# Patient Record
Sex: Female | Born: 1960 | Race: Black or African American | Hispanic: No | Marital: Single | Smoking: Never smoker
Health system: Southern US, Community
[De-identification: ages and names within clinical notes are randomized; demographics above are authoritative.]

## PROBLEM LIST (undated history)

## (undated) DIAGNOSIS — Z87442 Personal history of urinary calculi: Secondary | ICD-10-CM

## (undated) DIAGNOSIS — I10 Essential (primary) hypertension: Secondary | ICD-10-CM

## (undated) DIAGNOSIS — M1990 Unspecified osteoarthritis, unspecified site: Secondary | ICD-10-CM

## (undated) DIAGNOSIS — G4730 Sleep apnea, unspecified: Secondary | ICD-10-CM

## (undated) DIAGNOSIS — N200 Calculus of kidney: Secondary | ICD-10-CM

## (undated) HISTORY — PX: GASTRIC BYPASS: SHX52

---

## 2005-01-20 ENCOUNTER — Emergency Department: Payer: Self-pay | Admitting: Emergency Medicine

## 2005-01-23 ENCOUNTER — Ambulatory Visit: Payer: Self-pay | Admitting: Family Medicine

## 2005-01-23 IMAGING — US US EXTREM LOW VENOUS*R*
1 series · 18 of 24 positions shown · non-contrast
Comparison: none

REASON FOR EXAM: pain swelling
COMMENTS:

PROCEDURE:     US  - US DOPPLER LOW EXTR RIGHT  - [DATE]  [DATE]
RESULT:        The phasic, augmentation and  Valsalva flow waveforms are
normal.  The femoral and popliteal vein shows compressibility throughout its
course.  Doppler examination shows no deep venous occlusion.

[Series 1: us extrem low venous*right* · 18 of 32 slices shown]
[im 1/32]
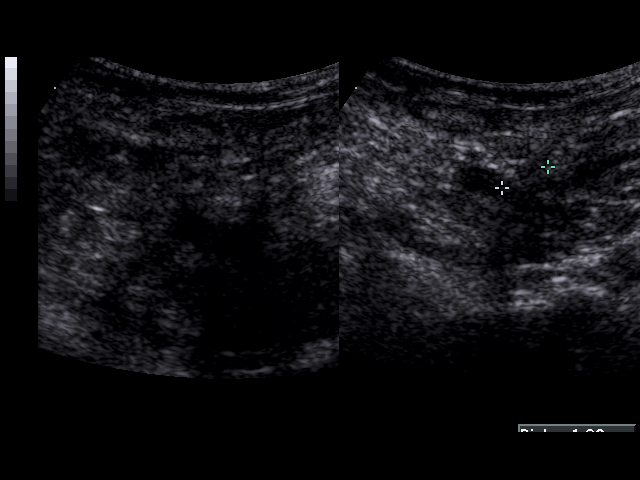
[im 3/32]
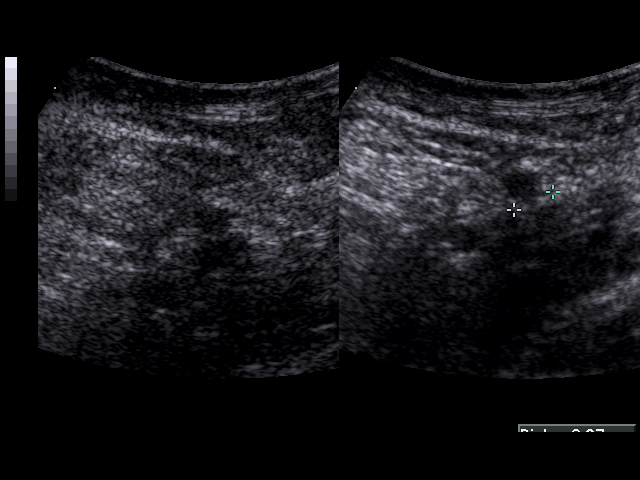
[im 5/32]
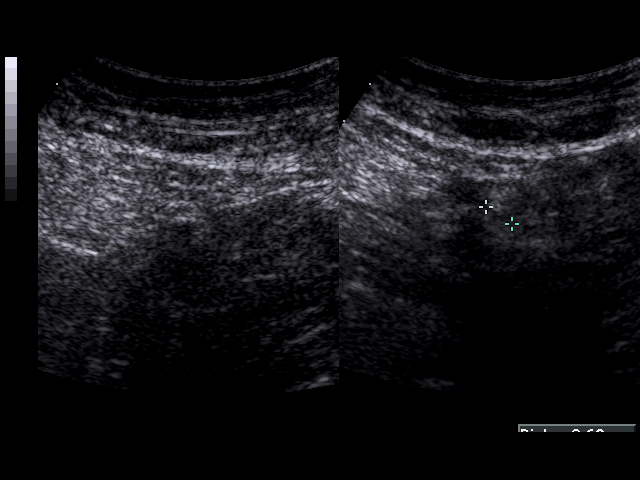
[im 6/32]
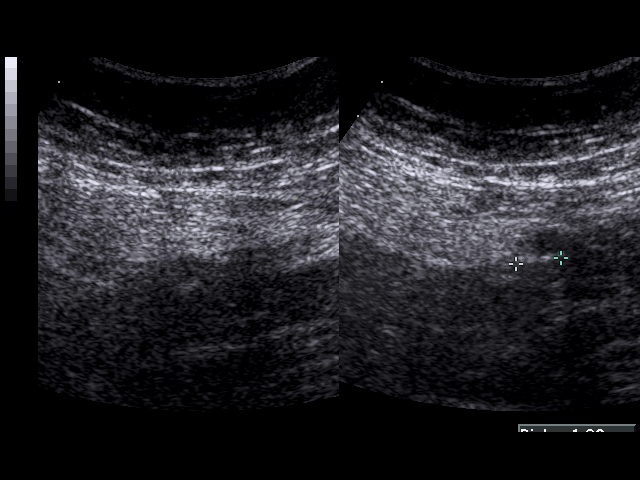
[im 9/32]
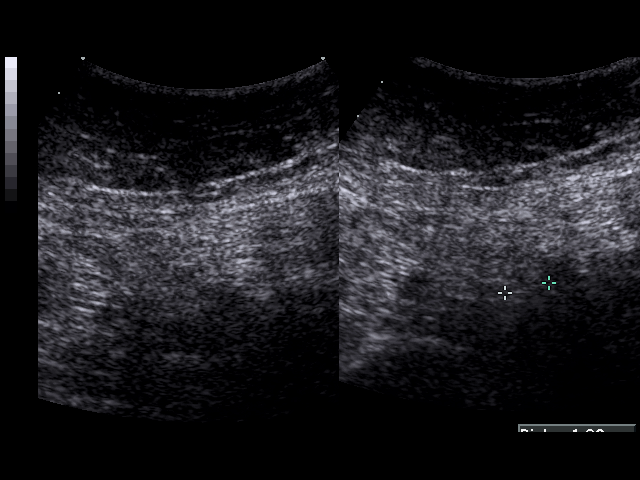
[im 10/32]
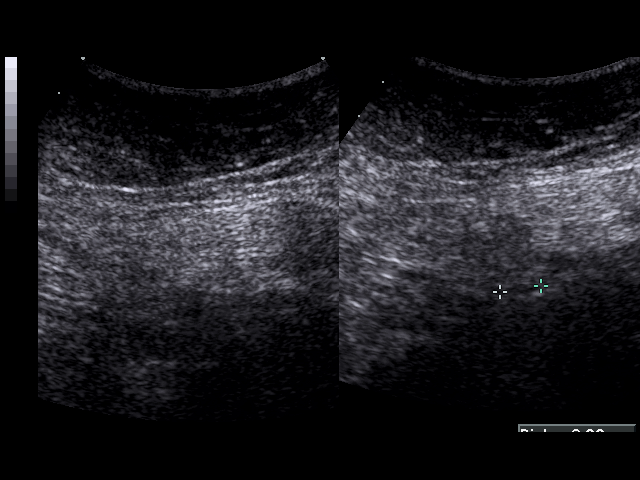
[im 11/32]
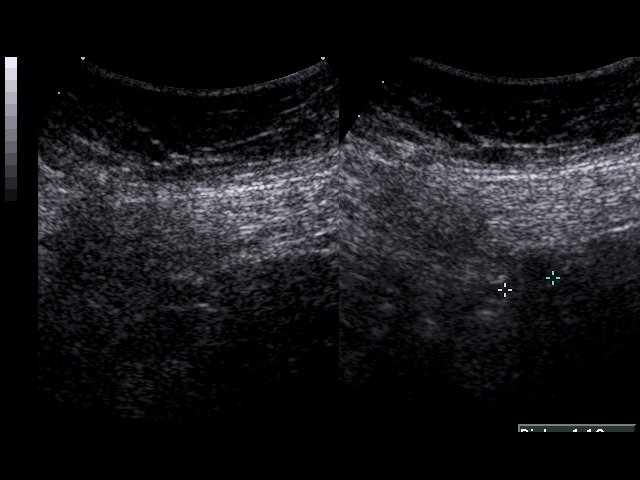
[im 14/32]
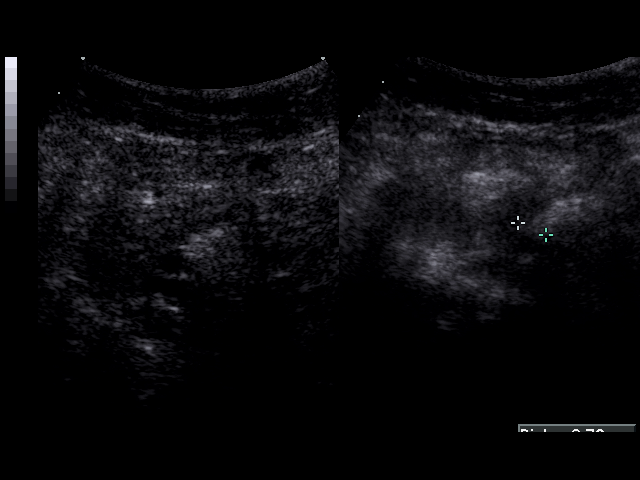
[im 15/32]
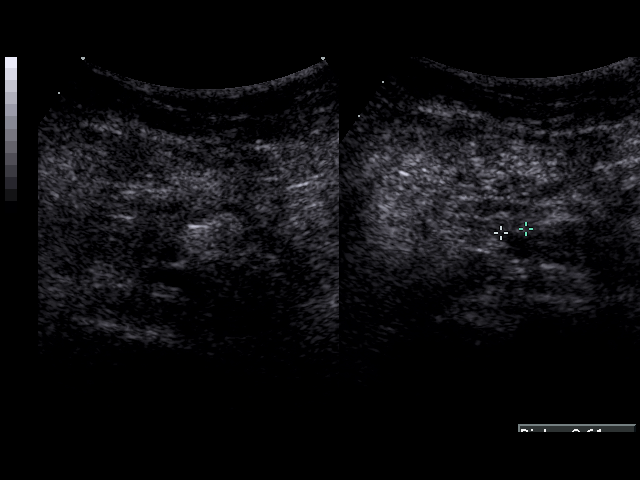
[im 17/32]
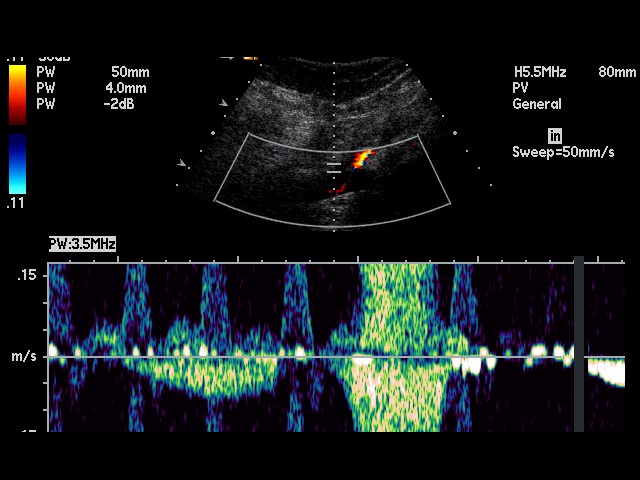
[im 19/32]
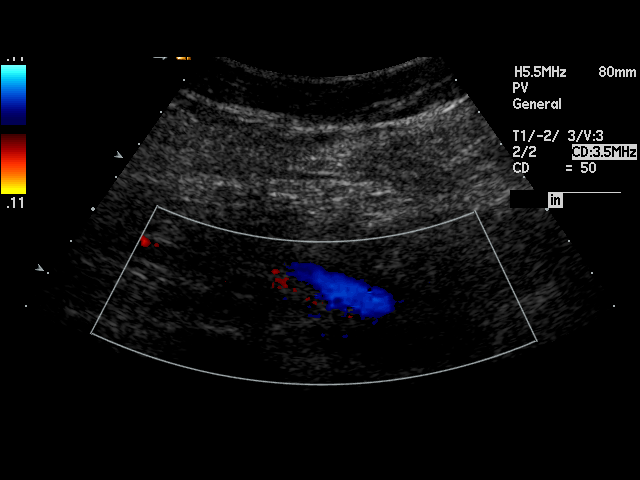
[im 21/32]
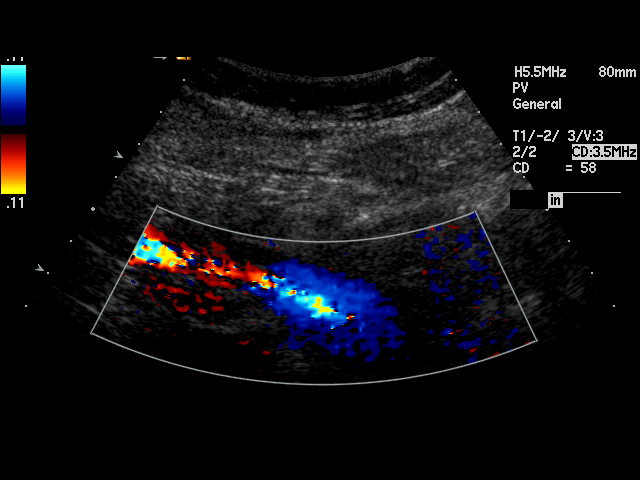
[im 22/32]
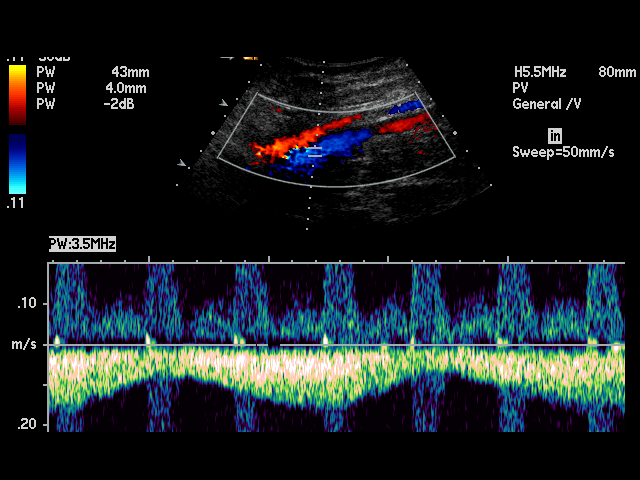
[im 25/32]
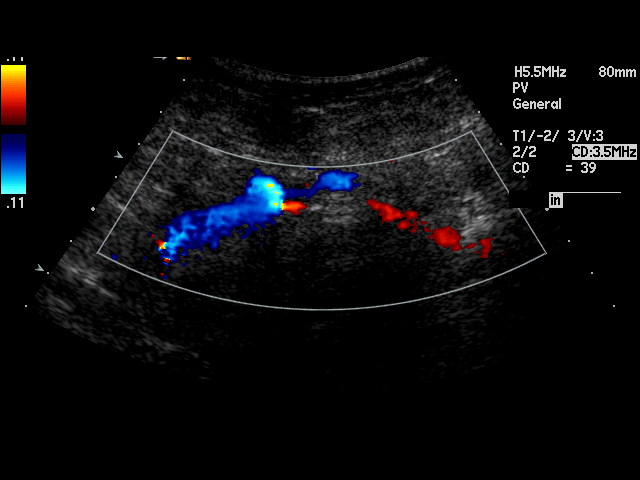
[im 26/32]
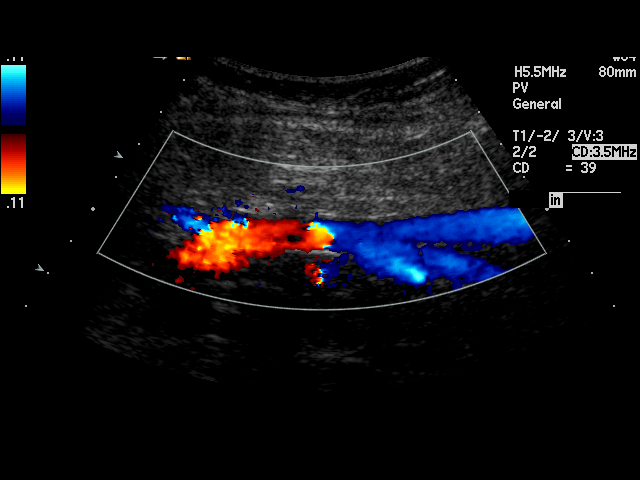
[im 27/32]
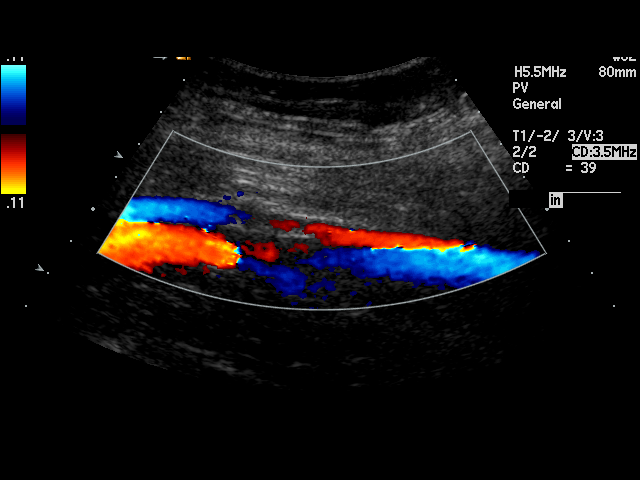
[im 30/32]
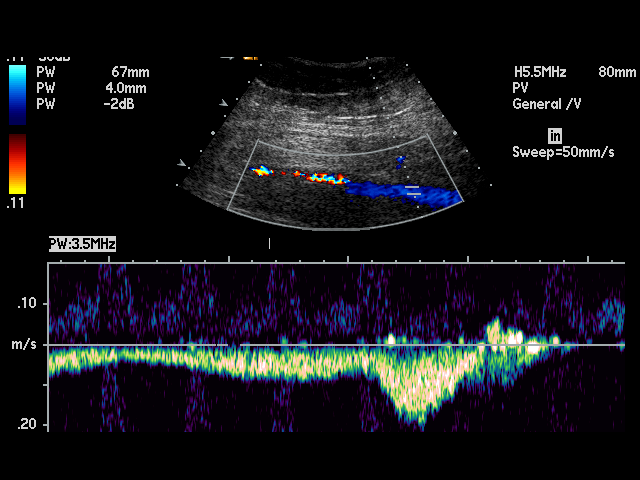
[im 32/32]
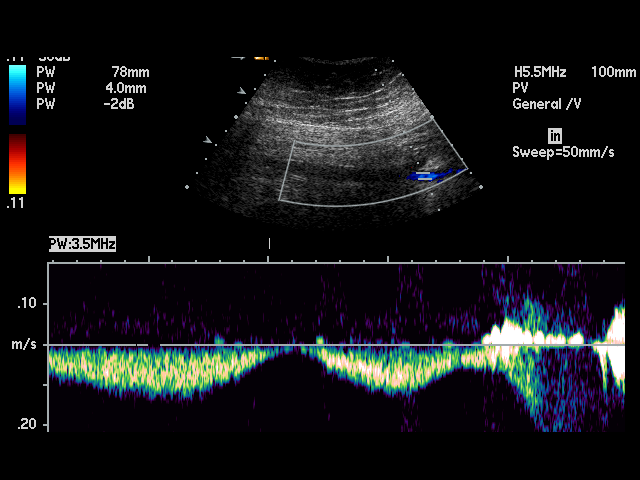

[18 of 24 positions shown; findings below may reference images not displayed]

IMPRESSION: No deep venous occlusion or deep venous thrombus is identified.

## 2007-01-20 IMAGING — CR DG CHEST 2V
2 series · 2 of 2 positions shown · non-contrast
Comparison: None.

CLINICAL DATA: Hemoptysis. Chills and fever.  Cough.  Bilateral leg swelling. 
 CHEST - 2 VIEW ? [DATE]:

[view not recorded (1 of 2)]
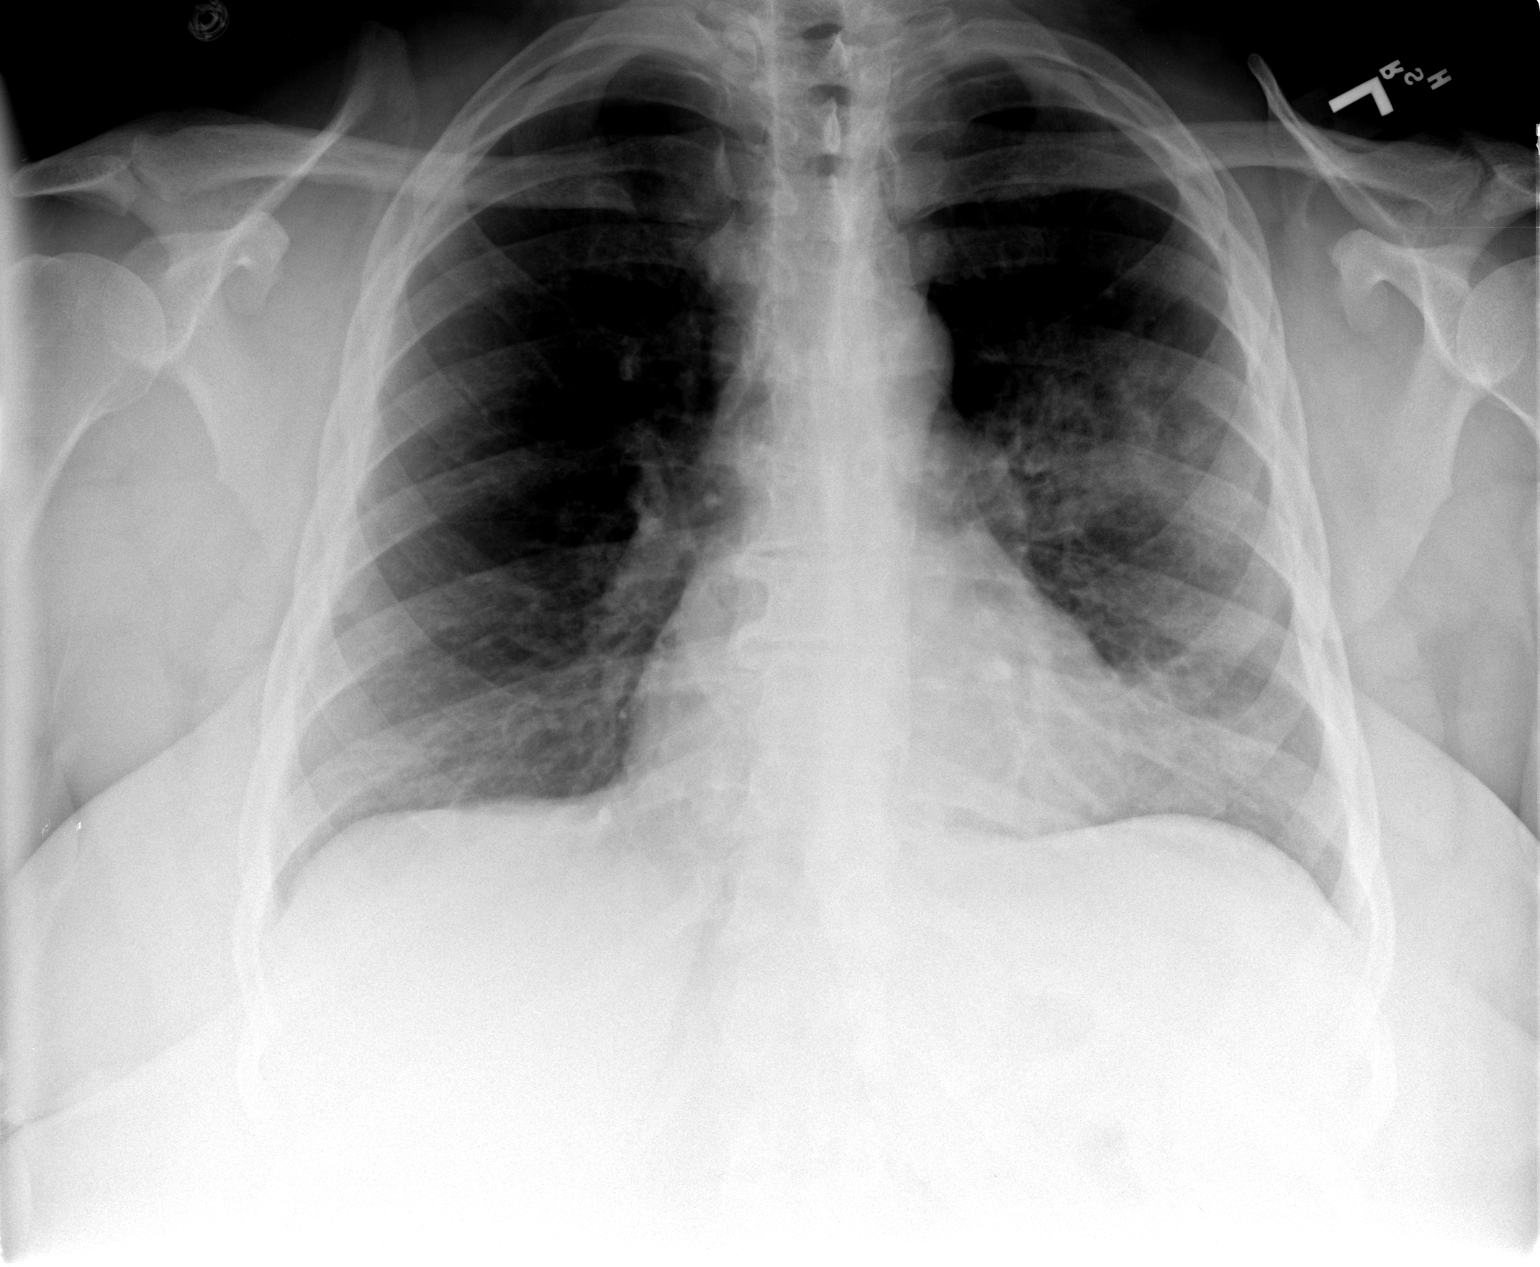

[view not recorded (2 of 2)]
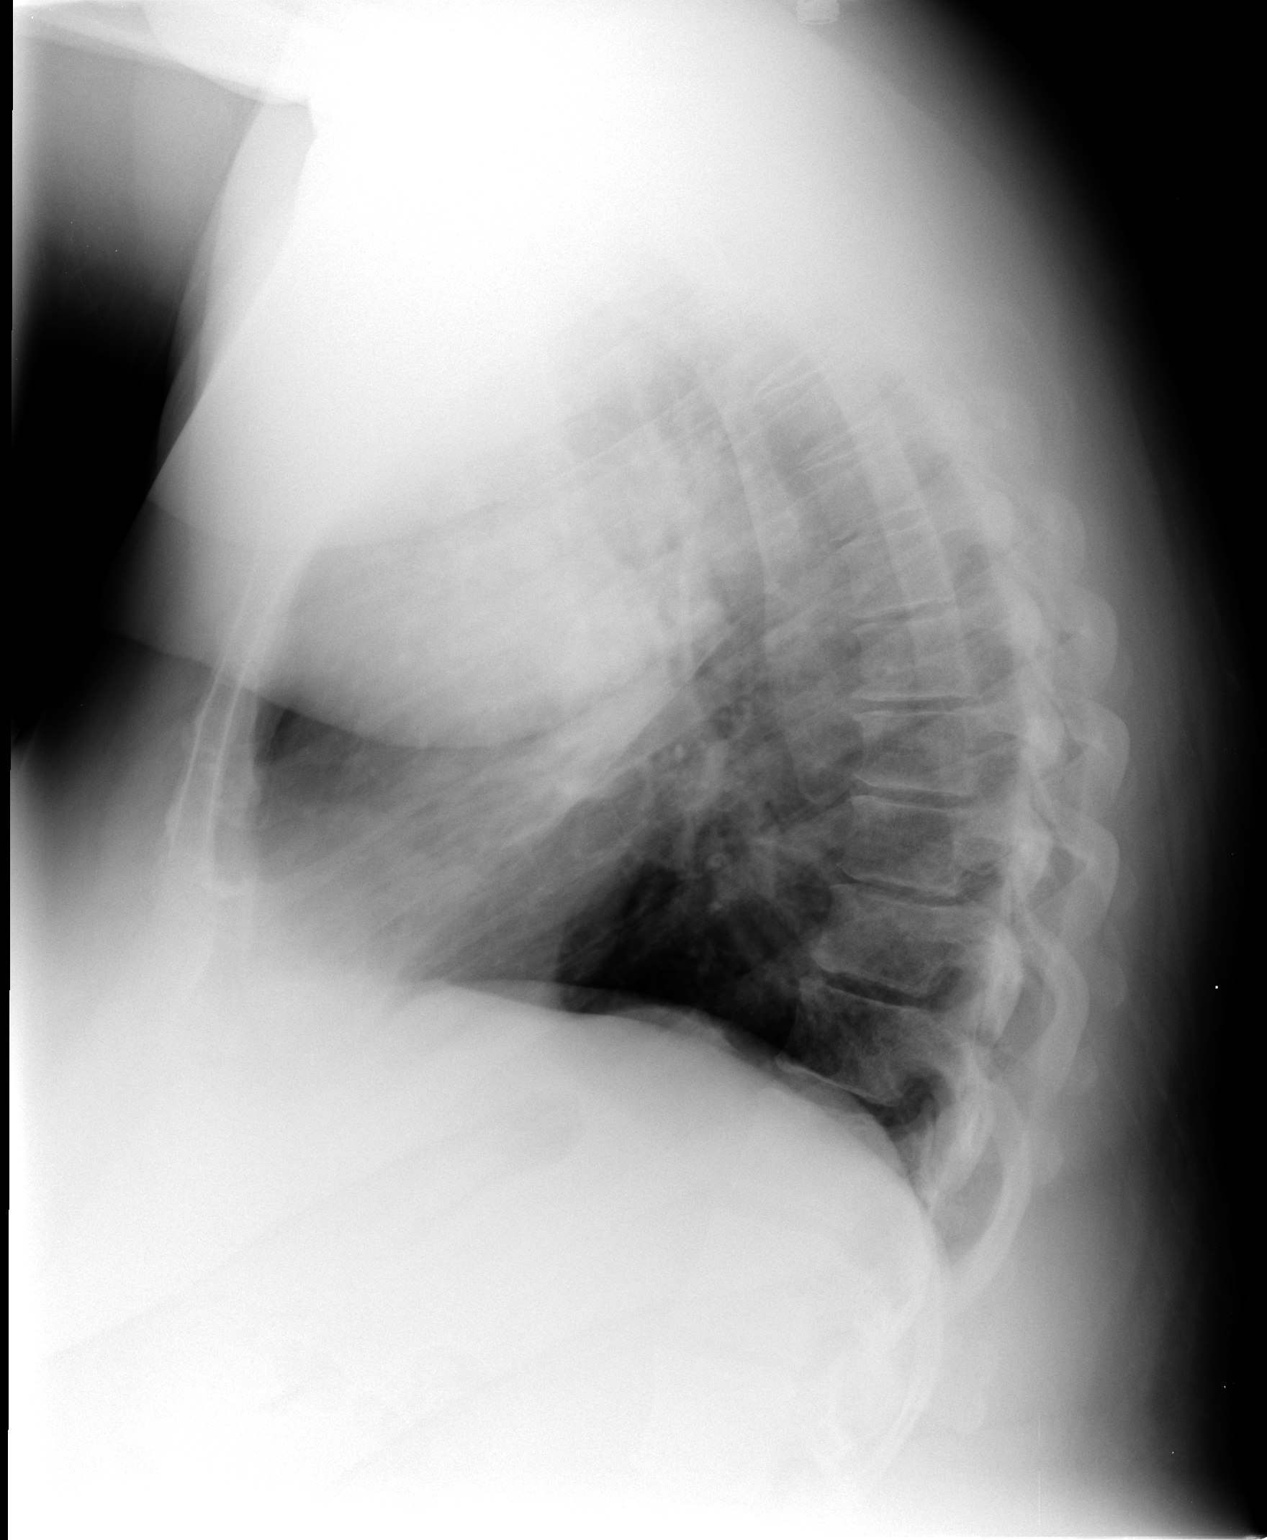

[2 of 2 positions shown; findings below may reference images not displayed]

FINDINGS: There is pneumonia in the left upper lobe, including the lingula.  The left lower lobe is clear.   The right lung is clear. The right lung is clear.  Heart size and vascularity are normal.  No acute bony abnormality.
IMPRESSION: Left upper lobe pneumonia.

## 2007-02-10 ENCOUNTER — Emergency Department (HOSPITAL_COMMUNITY): Admission: EM | Admit: 2007-02-10 | Discharge: 2007-02-10 | Payer: Self-pay | Admitting: *Deleted

## 2007-05-11 ENCOUNTER — Emergency Department: Payer: Self-pay | Admitting: Emergency Medicine

## 2007-05-11 IMAGING — CR DG ANKLE 2V *L*
1 series · 2 of 2 positions shown · non-contrast
Comparison: none

REASON FOR EXAM: pain and swelling
COMMENTS:

[Series 1: view not recorded · 0.17mm/px · 2 of 2 slices shown]
[im 1/2]
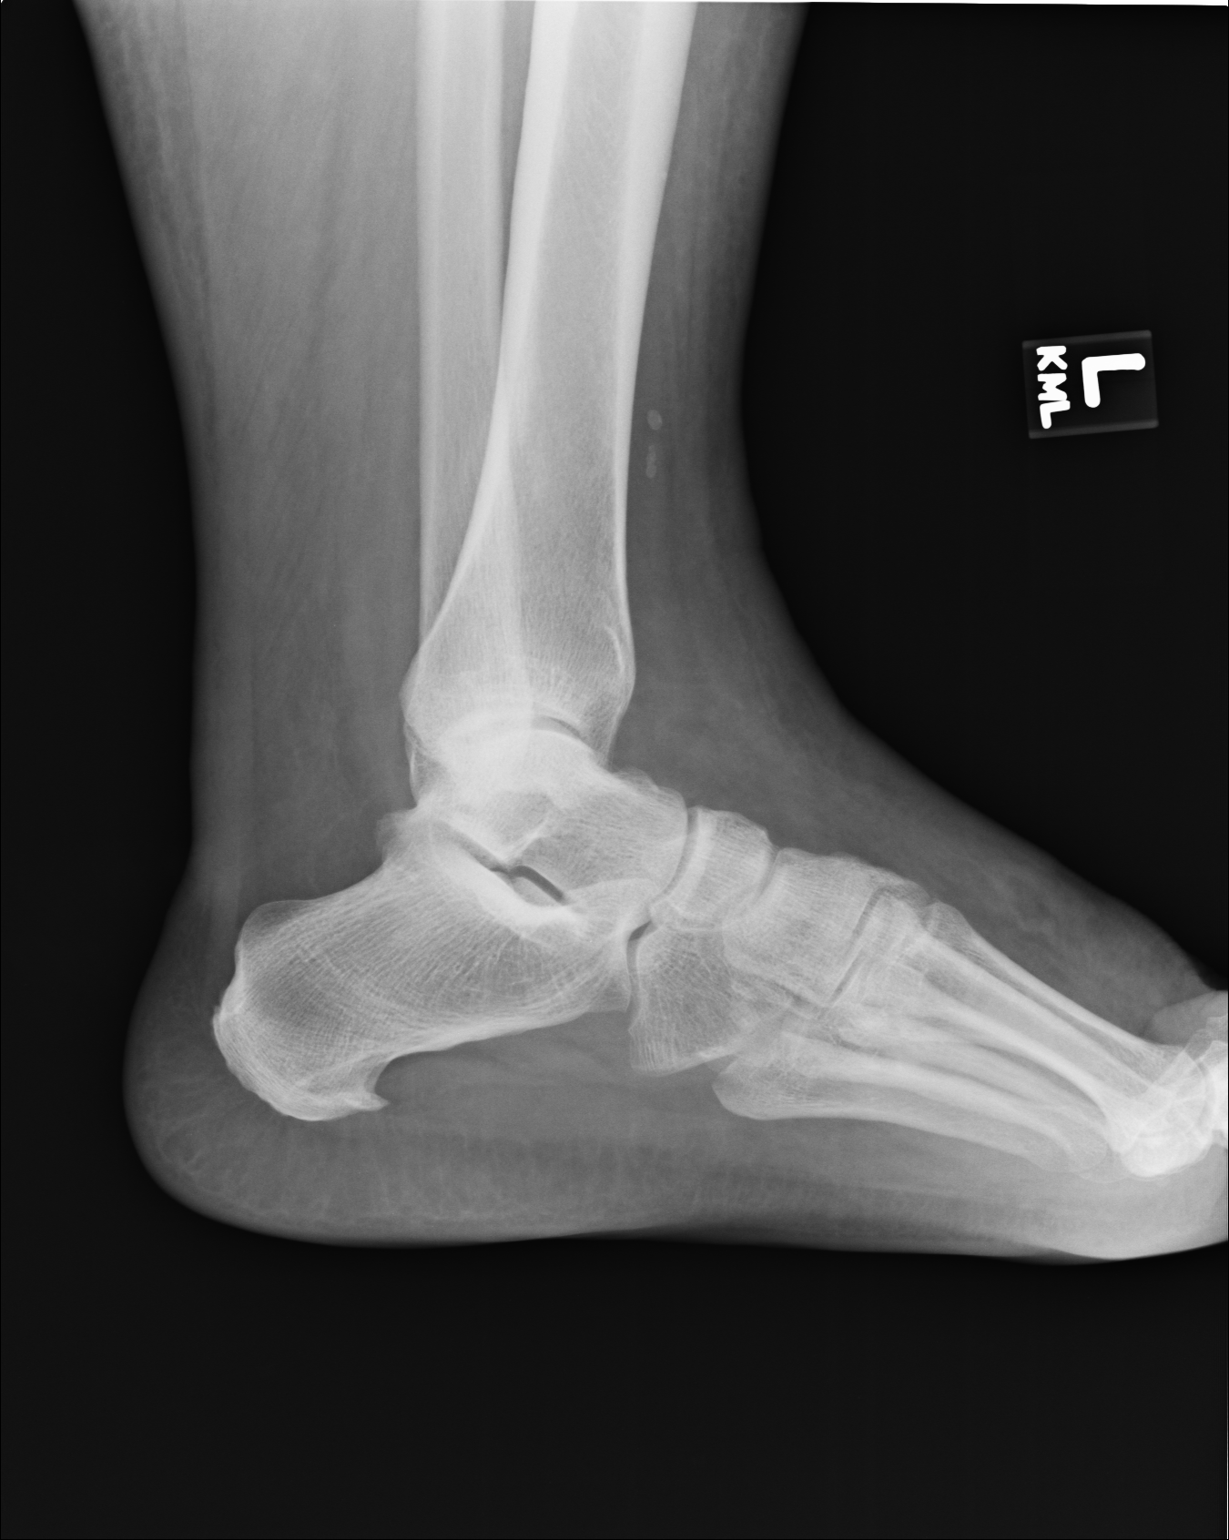
[im 2/2]
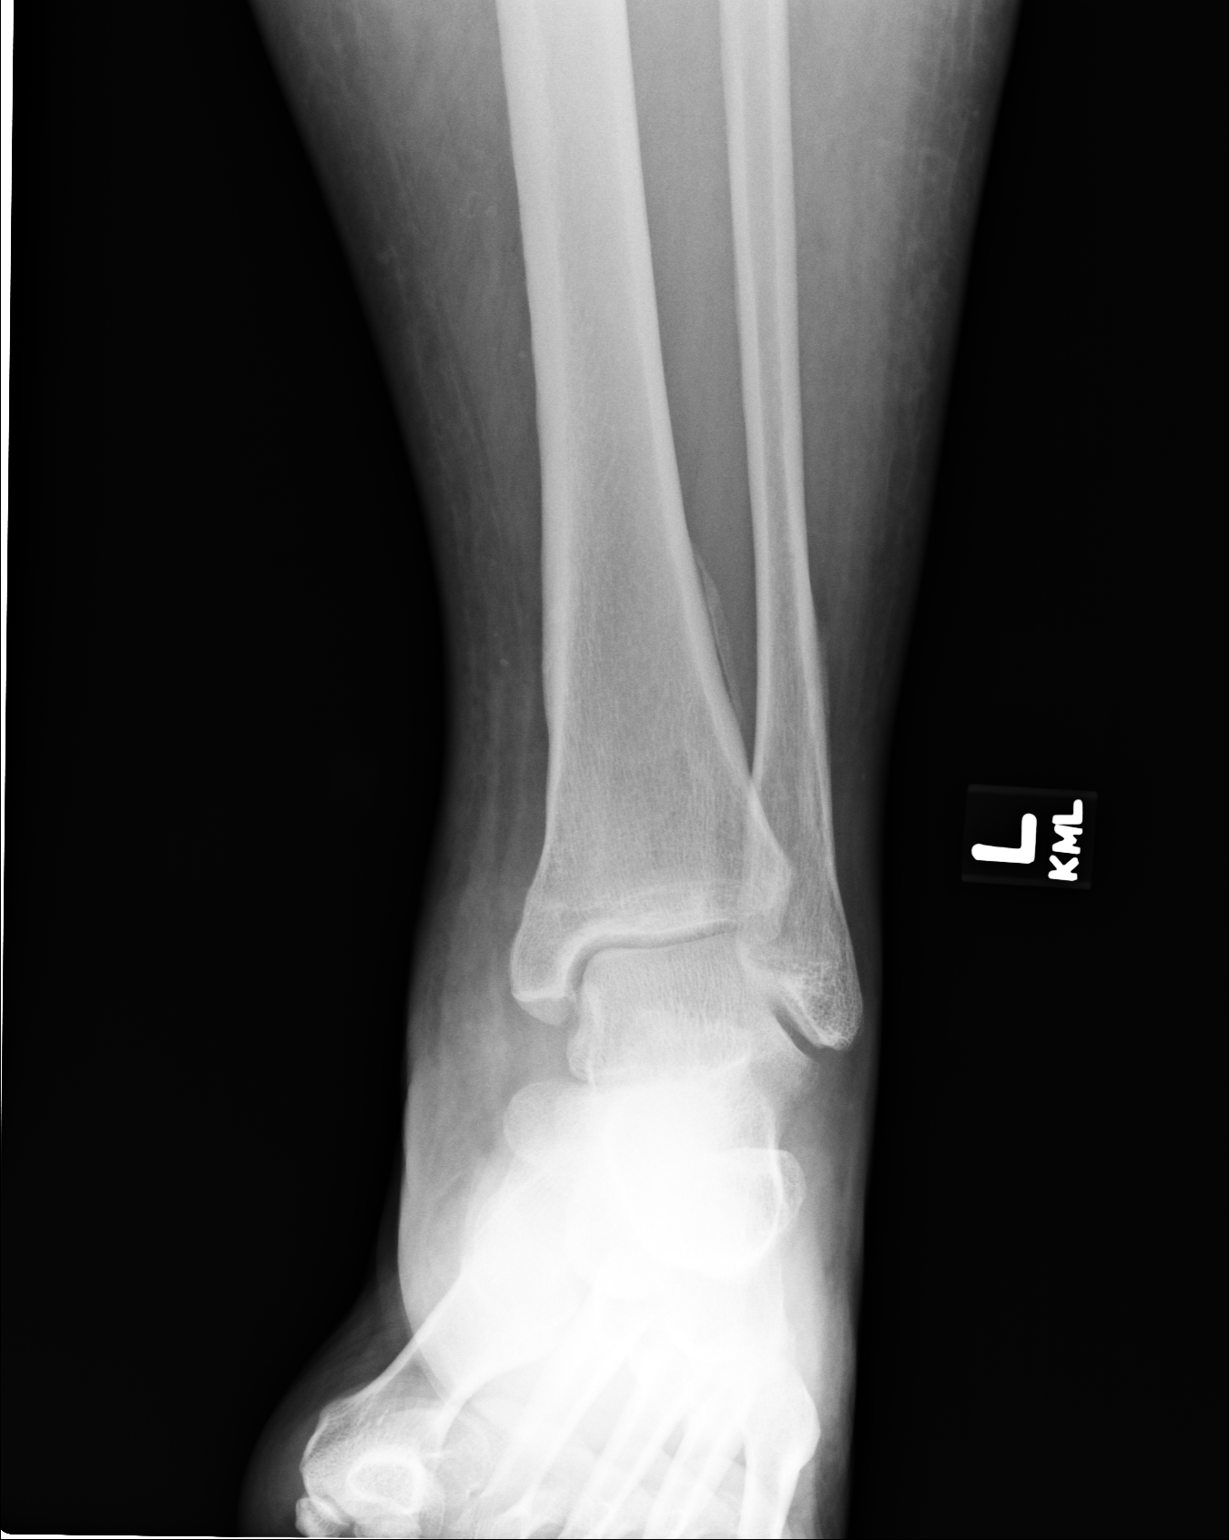

[2 of 2 positions shown; findings below may reference images not displayed]

PROCEDURE:     DXR - DXR ANKLE LEFT AP AND LATERAL  - [DATE]  [DATE]

RESULT:     AP and lateral views of the LEFT ankle reveals the joint mortise
to be preserved. No malleolar fracture is seen. There is a plantar calcaneal
spur. There is mild swelling over the medial malleolar region as well as
elsewhere in the ankle. There is subtle periosteal reaction along the medial
tibial metadiaphysis distally which likely is related to the interosseous
membrane.
IMPRESSION: 1. I do not see acute bony abnormality of the LEFT ankle. If there is a
history of trauma, followup CT or MRI may be useful.
2. There is a plantar calcaneal spur.

## 2007-05-11 IMAGING — US US EXTREM LOW VENOUS*L*
1 series · 17 of 24 positions shown · non-contrast
Comparison: none

REASON FOR EXAM: pain and swelling in leg   [HOSPITAL]
COMMENTS:

[Series 1: us extrem low venous*left* · 17 of 30 slices shown]
[im 1/30]
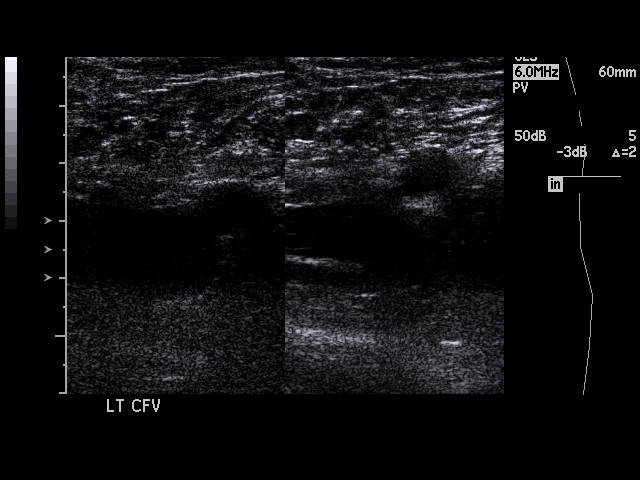
[im 3/30]
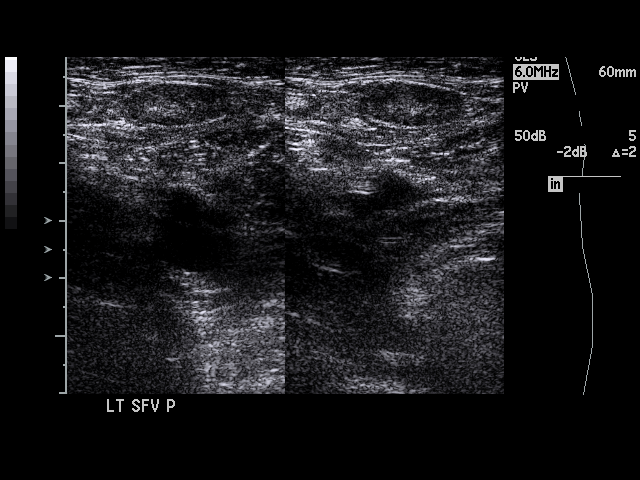
[im 4/30]
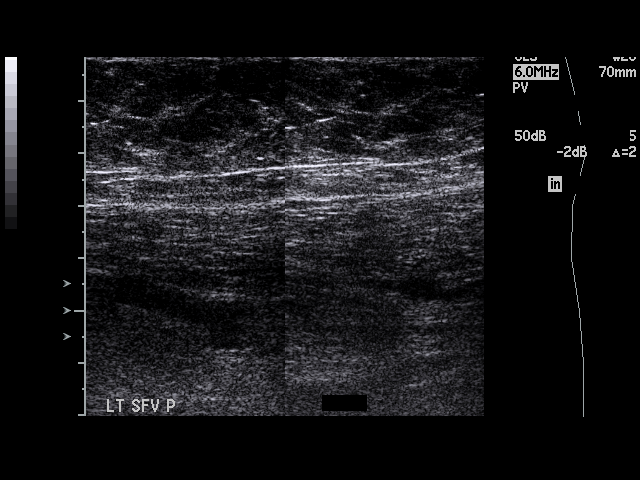
[im 6/30]
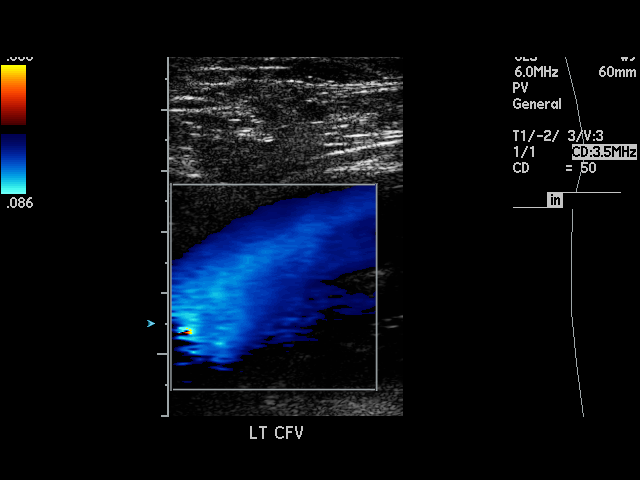
[im 8/30]
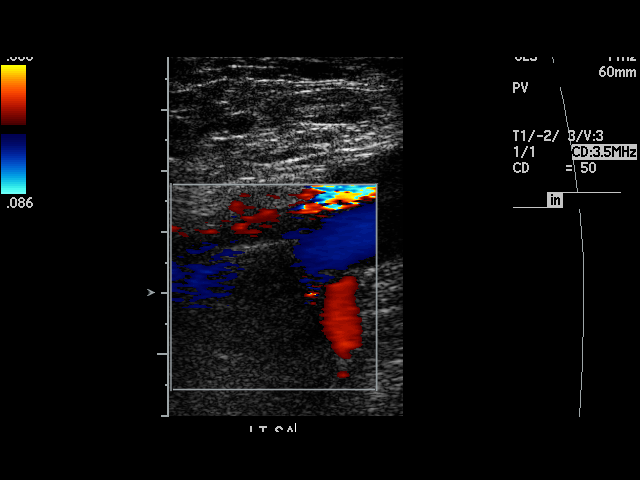
[im 9/30]
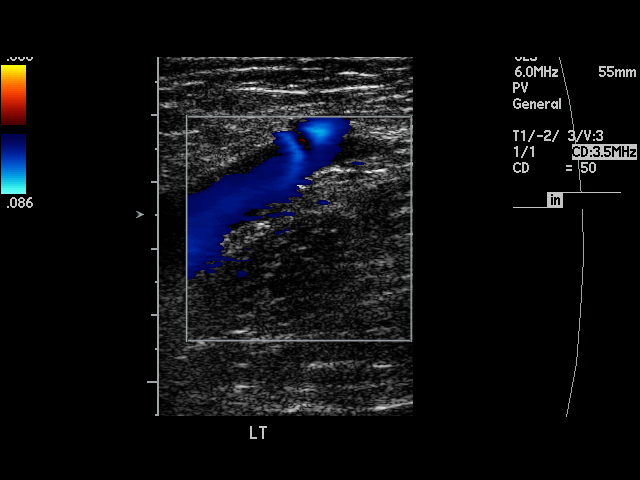
[im 12/30]
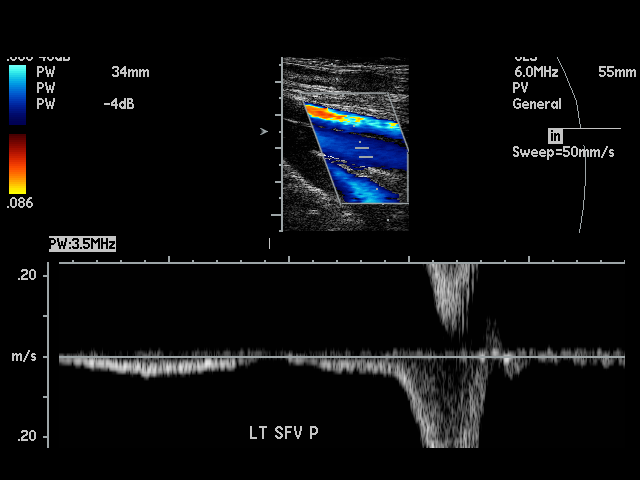
[im 13/30]
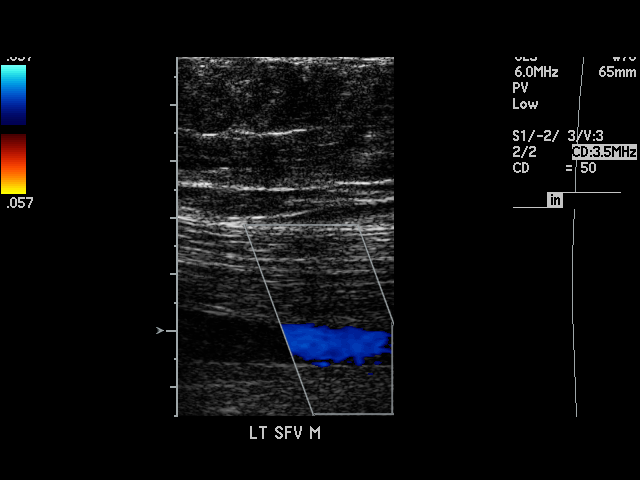
[im 16/30]
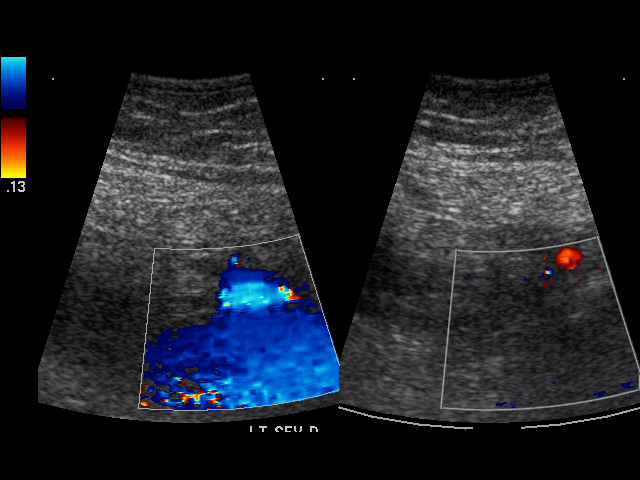
[im 17/30]
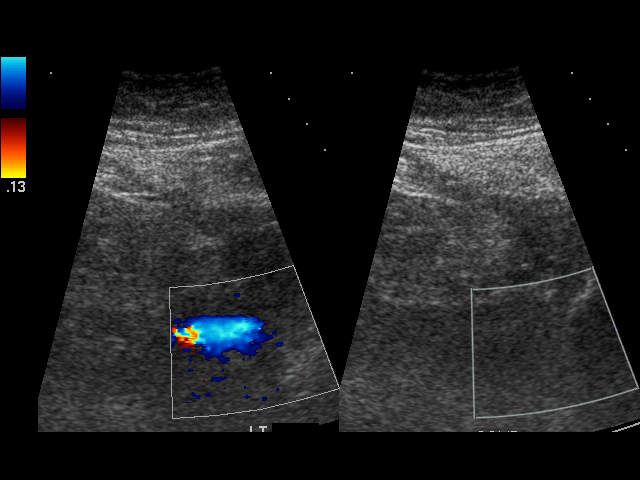
[im 18/30]
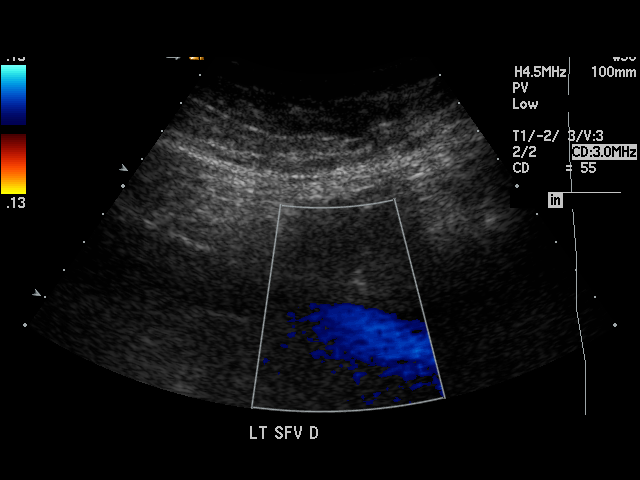
[im 21/30]
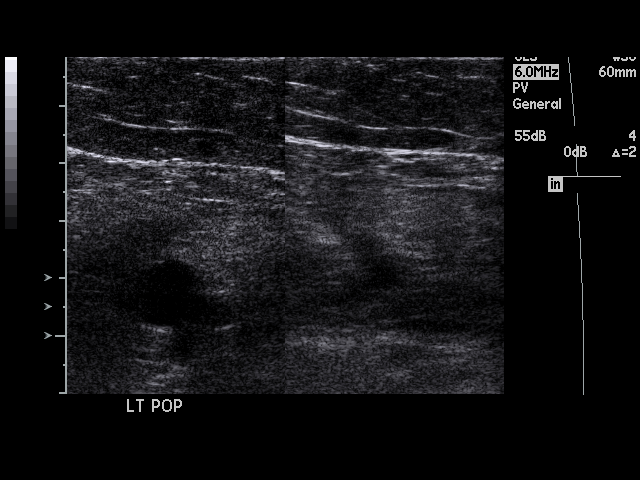
[im 22/30]
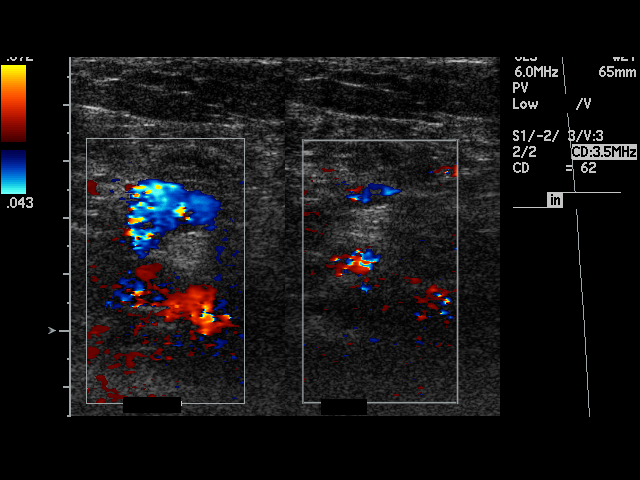
[im 24/30]
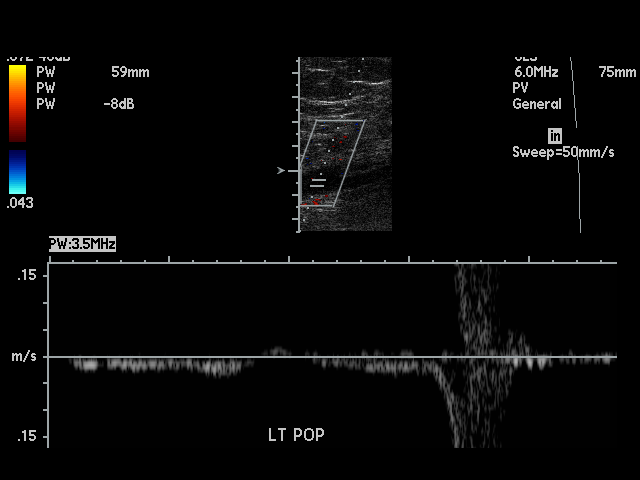
[im 26/30]
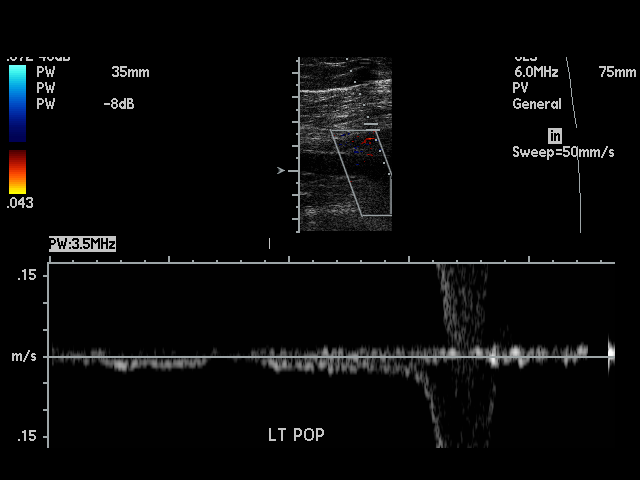
[im 27/30]
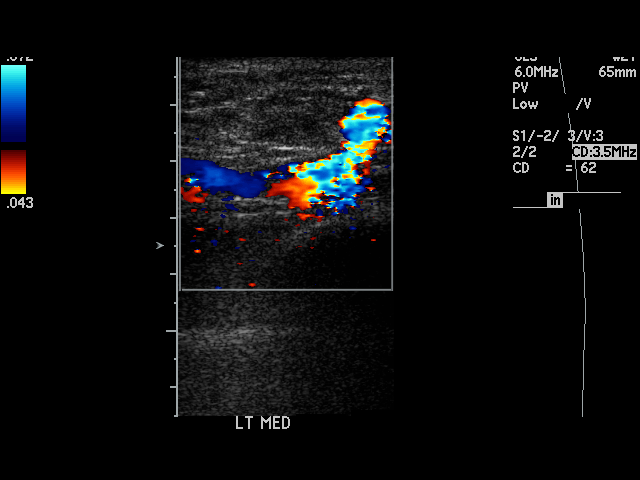
[im 30/30]
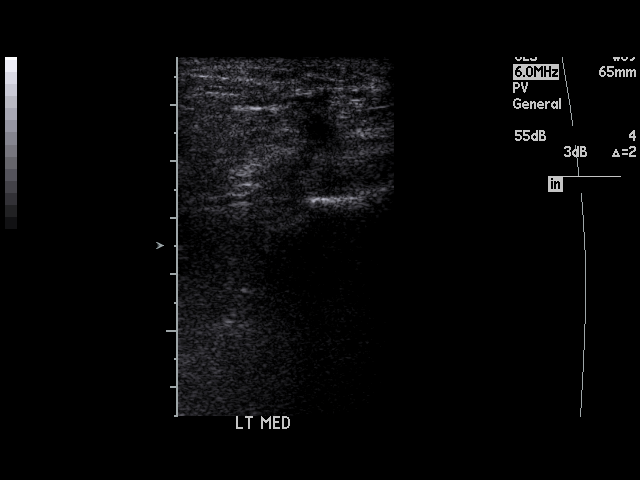

[17 of 24 positions shown; findings below may reference images not displayed]

PROCEDURE:     US  - US DOPPLER LOW EXTR LEFT  - [DATE]  [DATE]

RESULT:     Duplex Doppler interrogation of the deep venous system of the
left leg is performed with compression, color Doppler and spectral Doppler
technique. Limit investigation of the ankle medially is also performed.

The study demonstrates the deep venous system is fully compressible. The
color Doppler and spectral Doppler appearance is normal. There is increased
flow during distal augmentation. In the area of the patient's symptoms the
sonographic appearance is unremarkable.
IMPRESSION: No evidence of left lower extremity DVT.

## 2007-05-24 ENCOUNTER — Emergency Department: Payer: Self-pay

## 2008-07-21 ENCOUNTER — Emergency Department: Payer: Self-pay | Admitting: Emergency Medicine

## 2008-11-05 ENCOUNTER — Emergency Department: Payer: Self-pay | Admitting: Emergency Medicine
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
## 2008-12-18 ENCOUNTER — Emergency Department: Payer: Self-pay | Admitting: Emergency Medicine
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
## 2009-05-05 ENCOUNTER — Emergency Department: Payer: Self-pay | Admitting: Emergency Medicine
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
## 2010-05-22 ENCOUNTER — Emergency Department: Payer: Self-pay | Admitting: Emergency Medicine

## 2010-05-22 IMAGING — US US PELV - US TRANSVAGINAL
1 series · 17 of 25 positions shown · non-contrast
Comparison: none

REASON FOR EXAM: PELVIC PAIN, PLEASE EVAL FOR FLOW TO OVARIES
COMMENTS:

[Series 1: us pelv - us transvaginal · 17 of 46 slices shown]
[im 1/46]
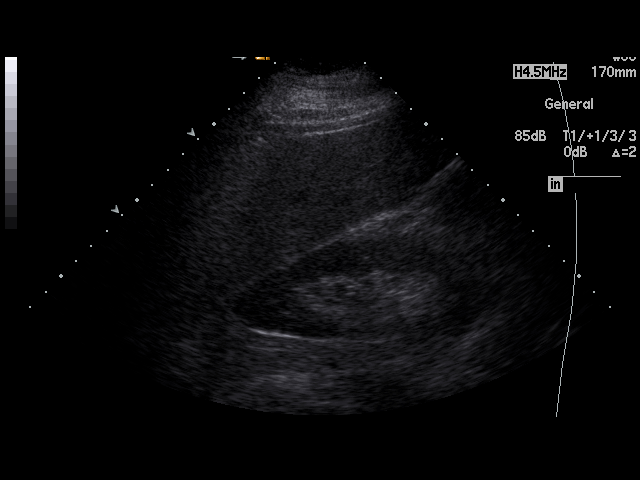
[im 4/46]
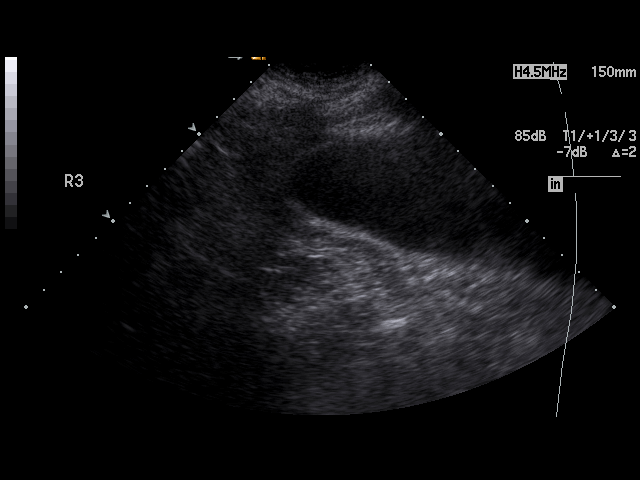
[im 6/46]
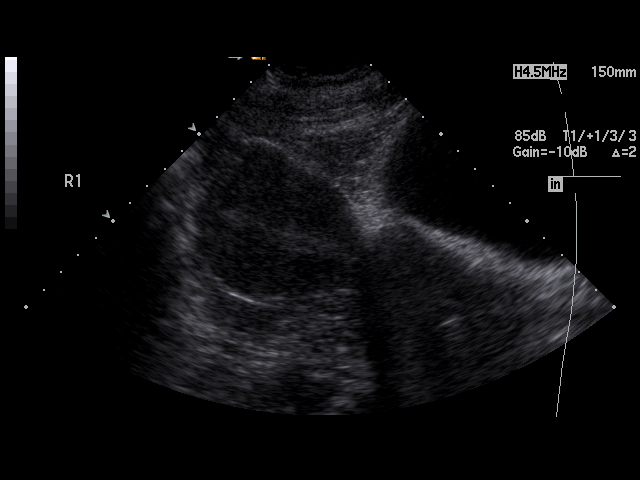
[im 10/46]
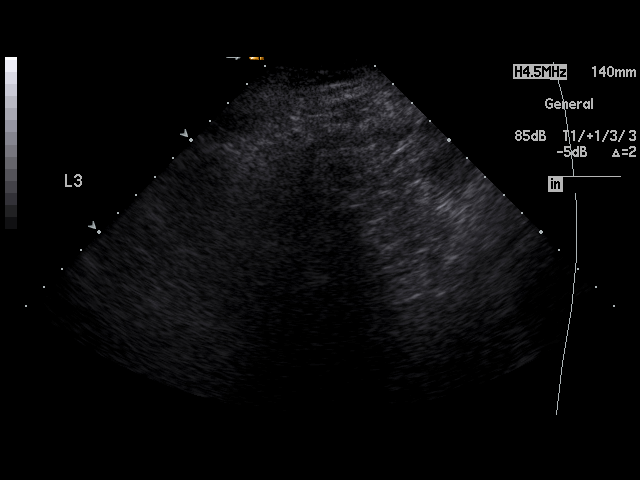
[im 12/46]
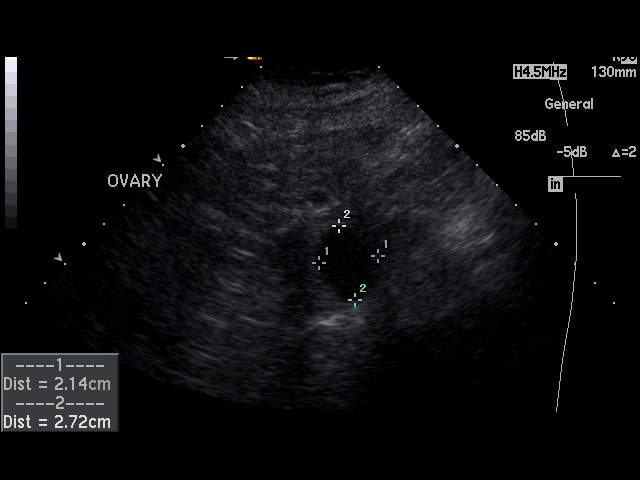
[im 16/46]
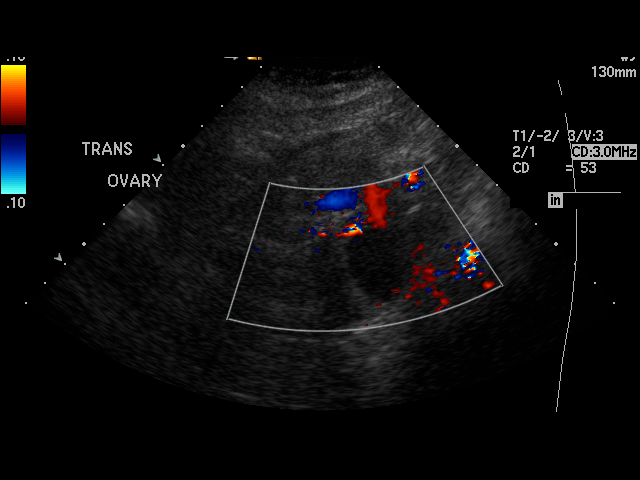
[im 17/46]
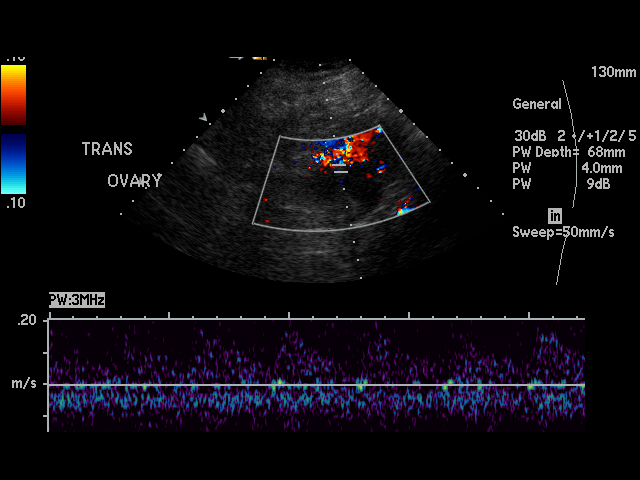
[im 21/46]
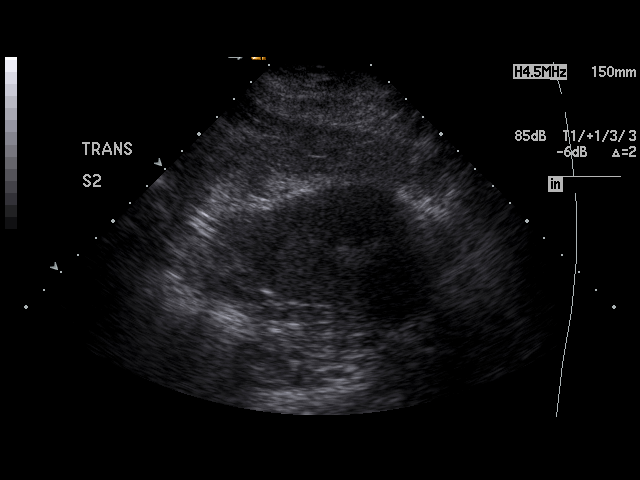
[im 23/46]
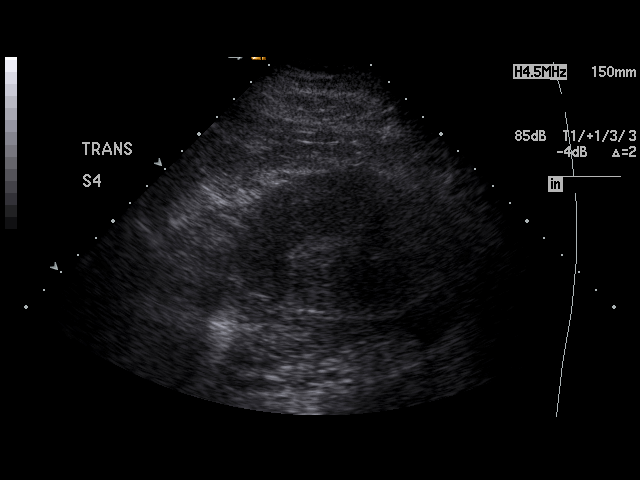
[im 25/46]
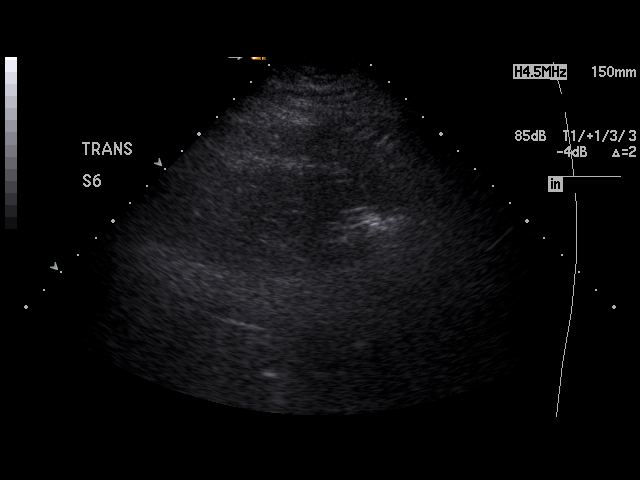
[im 29/46]
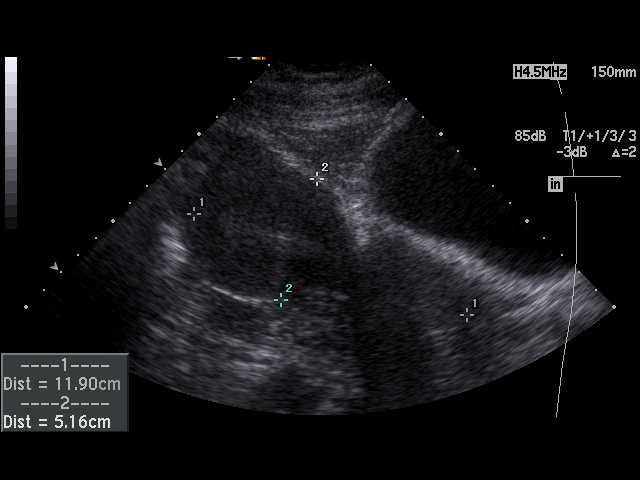
[im 31/46]
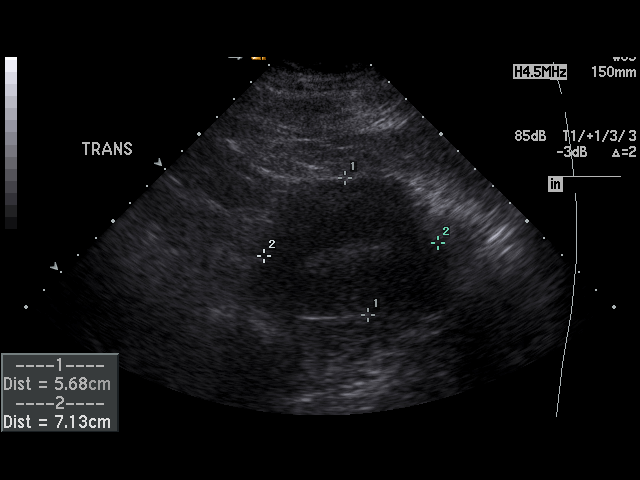
[im 34/46]
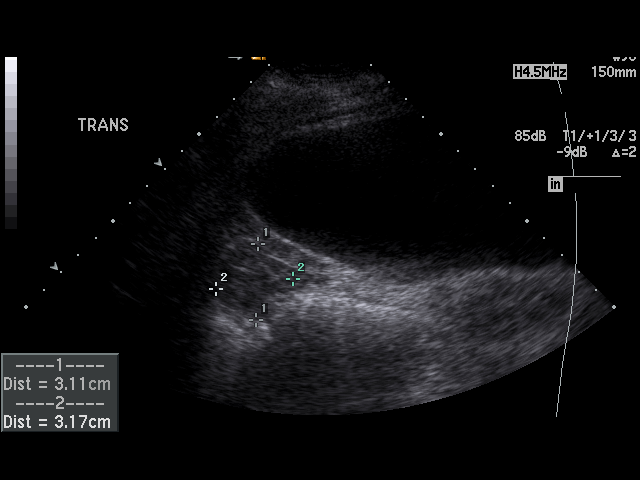
[im 36/46]
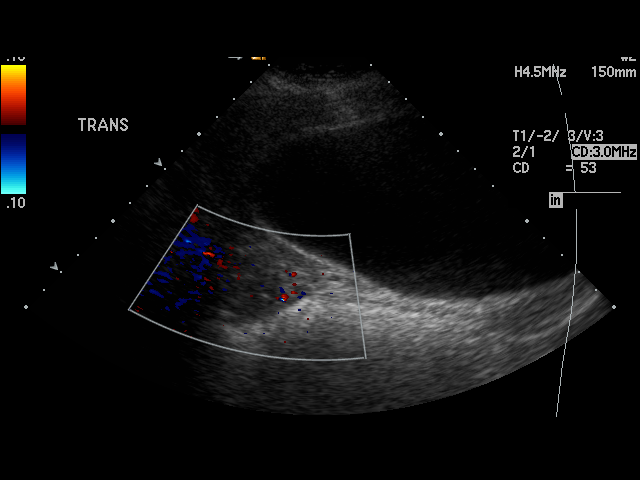
[im 40/46]
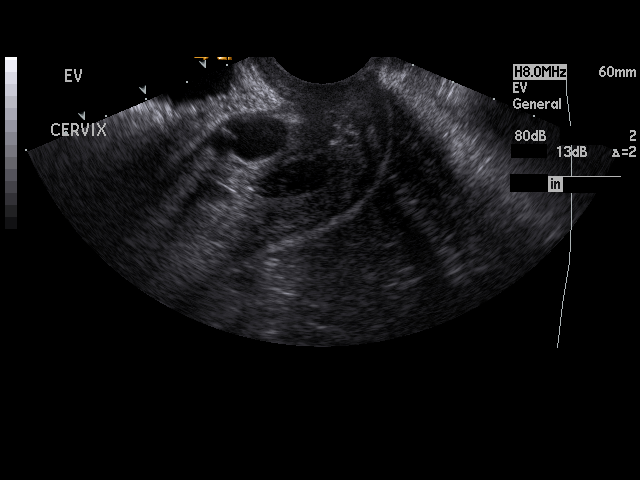
[im 42/46]
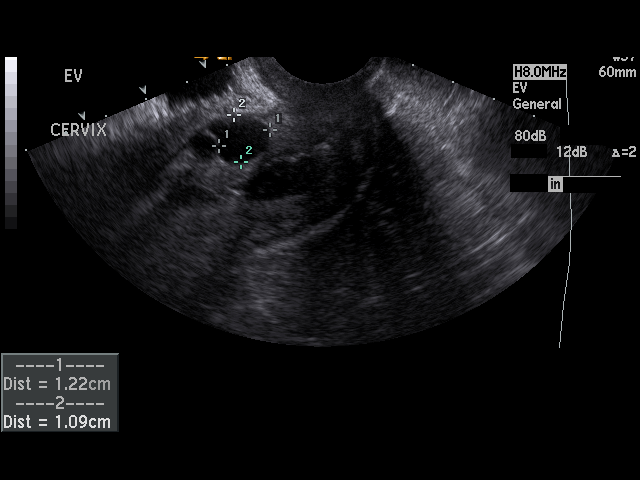
[im 46/46]
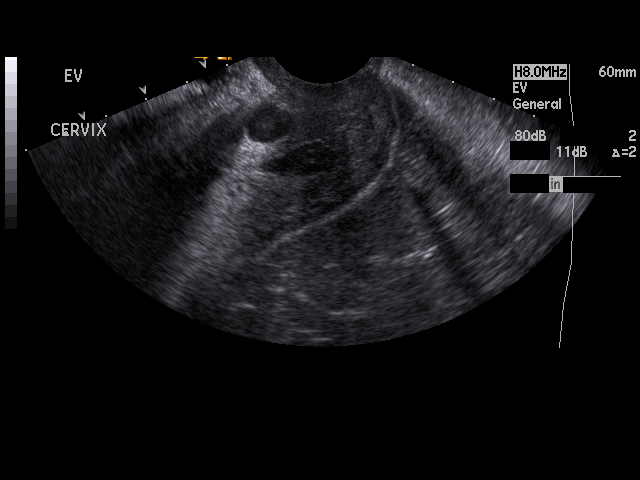

[17 of 25 positions shown; findings below may reference images not displayed]

PROCEDURE:     US  - US PELVIS EXAM W/TRANSVAGINAL  - [DATE] [DATE]

RESULT:     The uterus exhibits normal echotexture and contour. Is slightly
enlarged however and measures 11.9 x 5.7 x 7.1 cm. The endometrial stripe is
borderline thickened at 9 mm.

The right ovary measures 4.1 x 3.1 x 3.2 cm. The left ovary measures 3.5 x
3.4 x 3.2 cm. The left ovary contains a simple appearing cyst measuring
x 2.7 x 2.1 centimeters. Vascularity of the ovaries is grossly normal as
best as can be determined given the patient's body habitus. There are likely
nabothian cysts present.
IMPRESSION: The study is limited due to the patient's body habitus.
Vascularity of the ovaries is grossly normal. The uterus appears mildly
enlarged and the endometrial stripe is mildly thickened at 9 mm.

A preliminary report was sent to the [HOSPITAL] the conclusion
of the study.

## 2011-06-20 LAB — I-STAT 8, (EC8 V) (CONVERTED LAB)
BUN: 5 — ABNORMAL LOW
Bicarbonate: 24.7 — ABNORMAL HIGH
Chloride: 105
Glucose, Bld: 120 — ABNORMAL HIGH
HCT: 40
Hemoglobin: 13.6
Operator id: 288831
Potassium: 3.6
Sodium: 138
TCO2: 26
pCO2, Ven: 40 — ABNORMAL LOW
pH, Ven: 7.4 — ABNORMAL HIGH

## 2011-06-20 LAB — POCT I-STAT CREATININE
Creatinine, Ser: 1
Operator id: 288831

## 2011-06-20 LAB — CBC
HCT: 36.9
Hemoglobin: 12.8
MCHC: 34.6
MCV: 88.9
Platelets: 187
RBC: 4.15
RDW: 13.9
WBC: 10.9 — ABNORMAL HIGH

## 2011-06-20 LAB — DIFFERENTIAL
Basophils Absolute: 0
Basophils Relative: 0
Eosinophils Absolute: 0.1
Eosinophils Relative: 1
Lymphocytes Relative: 12
Lymphs Abs: 1.3
Monocytes Absolute: 0.5
Monocytes Relative: 5
Neutro Abs: 8.9 — ABNORMAL HIGH
Neutrophils Relative %: 82 — ABNORMAL HIGH

## 2011-06-20 LAB — D-DIMER, QUANTITATIVE: D-Dimer, Quant: 0.47

## 2011-06-20 LAB — B-NATRIURETIC PEPTIDE (CONVERTED LAB): Pro B Natriuretic peptide (BNP): 88

## 2013-09-24 ENCOUNTER — Emergency Department: Payer: Self-pay

## 2013-09-24 LAB — CBC WITH DIFFERENTIAL/PLATELET
Basophil #: 0 10*3/uL (ref 0.0–0.1)
Basophil %: 0.5 %
Eosinophil #: 0.2 10*3/uL (ref 0.0–0.7)
Eosinophil %: 2.4 %
HCT: 42.7 % (ref 35.0–47.0)
HGB: 14.2 g/dL (ref 12.0–16.0)
Lymphocyte #: 2.1 10*3/uL (ref 1.0–3.6)
Lymphocyte %: 26.5 %
MCH: 30.2 pg (ref 26.0–34.0)
MCHC: 33.3 g/dL (ref 32.0–36.0)
MCV: 91 fL (ref 80–100)
Monocyte #: 0.5 x10 3/mm (ref 0.2–0.9)
Monocyte %: 6.6 %
Neutrophil #: 5.1 10*3/uL (ref 1.4–6.5)
Neutrophil %: 64 %
Platelet: 212 10*3/uL (ref 150–440)
RBC: 4.7 10*6/uL (ref 3.80–5.20)
RDW: 13.8 % (ref 11.5–14.5)
WBC: 8 10*3/uL (ref 3.6–11.0)

## 2013-09-24 LAB — URINALYSIS, COMPLETE
Bacteria: NONE SEEN
Bilirubin,UR: NEGATIVE
Glucose,UR: NEGATIVE mg/dL (ref 0–75)
Ketone: NEGATIVE
Leukocyte Esterase: NEGATIVE
Nitrite: NEGATIVE
Ph: 5 (ref 4.5–8.0)
Protein: 100
RBC,UR: 2190 /HPF (ref 0–5)
Specific Gravity: 1.026 (ref 1.003–1.030)
Squamous Epithelial: 40
WBC UR: 6 /HPF (ref 0–5)

## 2013-09-24 LAB — COMPREHENSIVE METABOLIC PANEL
Albumin: 4.1 g/dL (ref 3.4–5.0)
Alkaline Phosphatase: 129 U/L — ABNORMAL HIGH
Anion Gap: 2 — ABNORMAL LOW (ref 7–16)
BUN: 11 mg/dL (ref 7–18)
Bilirubin,Total: 1.2 mg/dL — ABNORMAL HIGH (ref 0.2–1.0)
Calcium, Total: 9.2 mg/dL (ref 8.5–10.1)
Chloride: 108 mmol/L — ABNORMAL HIGH (ref 98–107)
Co2: 31 mmol/L (ref 21–32)
Creatinine: 1.14 mg/dL (ref 0.60–1.30)
EGFR (African American): >60
EGFR (Non-African Amer.): 56 — ABNORMAL LOW
Glucose: 106 mg/dL — ABNORMAL HIGH (ref 65–99)
Osmolality: 281 (ref 275–301)
Potassium: 4 mmol/L (ref 3.5–5.1)
SGOT(AST): 14 U/L — ABNORMAL LOW (ref 15–37)
SGPT (ALT): 23 U/L (ref 12–78)
Sodium: 141 mmol/L (ref 136–145)
Total Protein: 7.9 g/dL (ref 6.4–8.2)

## 2013-09-24 IMAGING — US US RENAL KIDNEY
1 series · 14 of 25 positions shown · non-contrast
Comparison: No recent similar comparison exam at this institution

CLINICAL DATA: Left-sided flank pain

EXAM:
RENAL/URINARY TRACT ULTRASOUND COMPLETE

[Series 1: us renal kidney · 0.24mm/px · 14 of 41 slices shown]
[im 1/41]
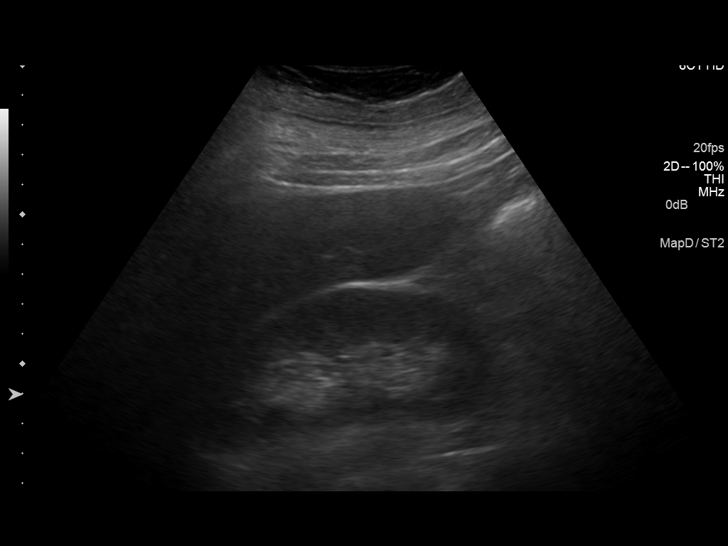
[im 4/41]
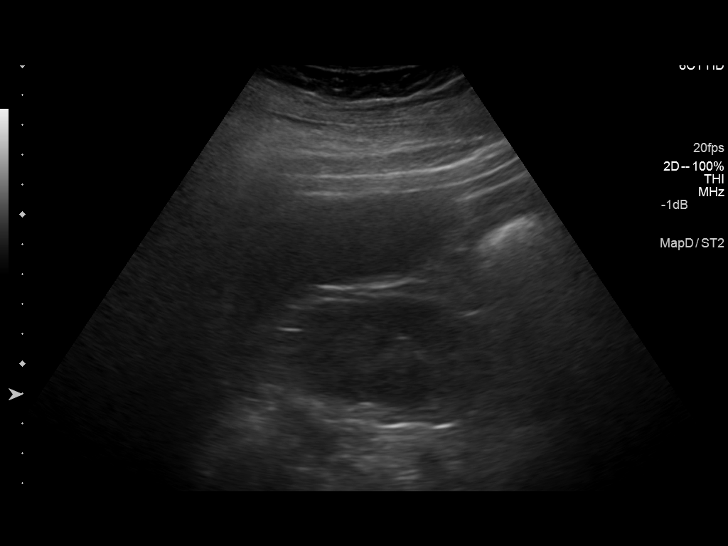
[im 7/41]
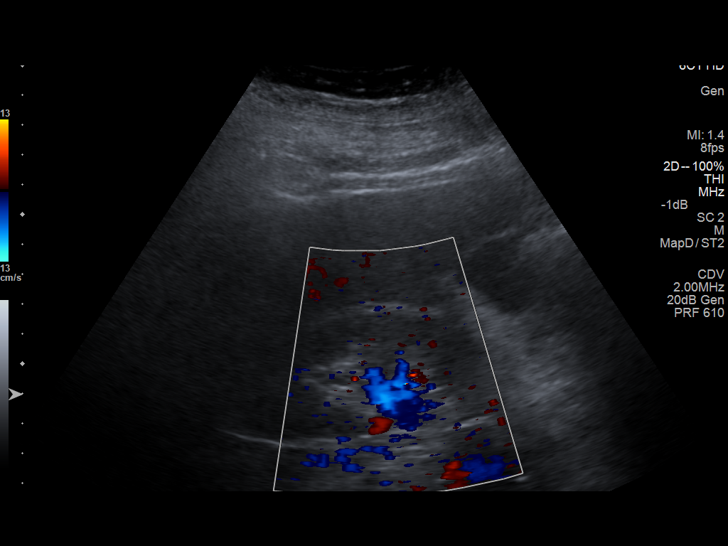
[im 11/41]
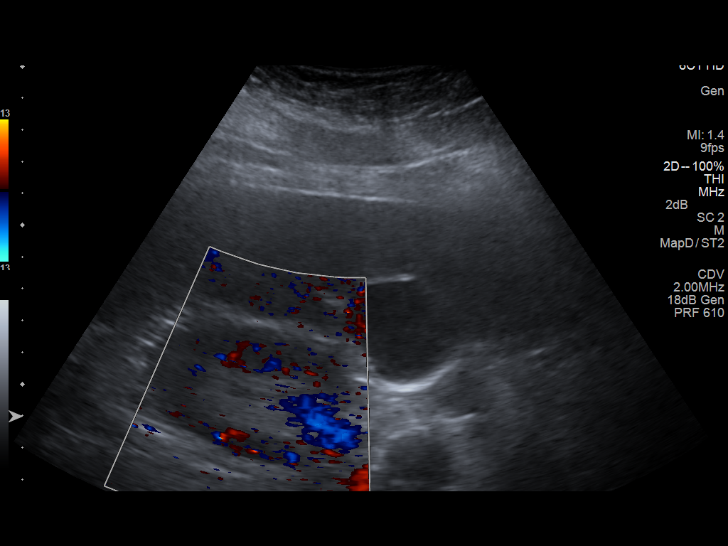
[im 14/41]
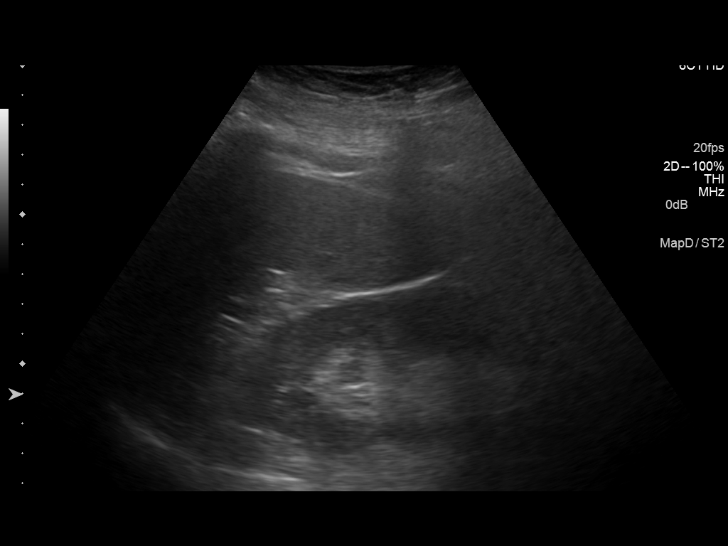
[im 16/41]
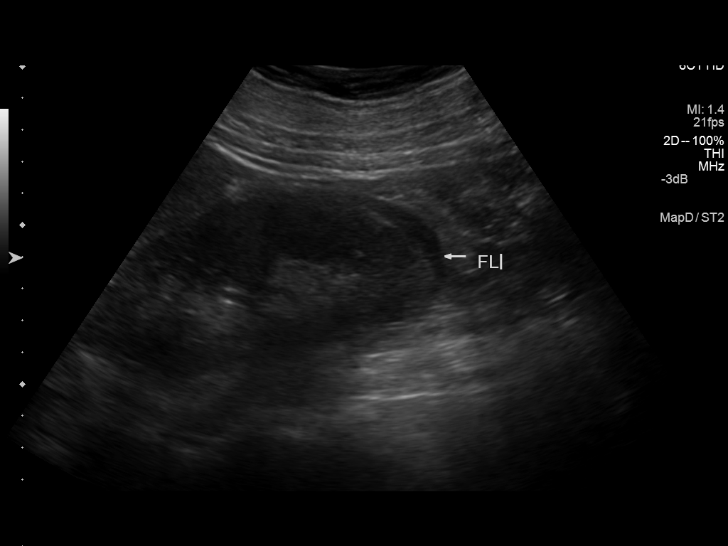
[im 19/41]
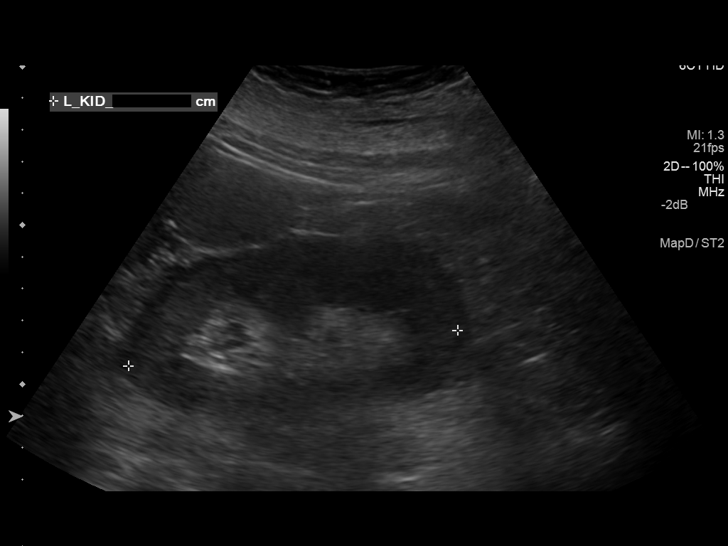
[im 22/41]
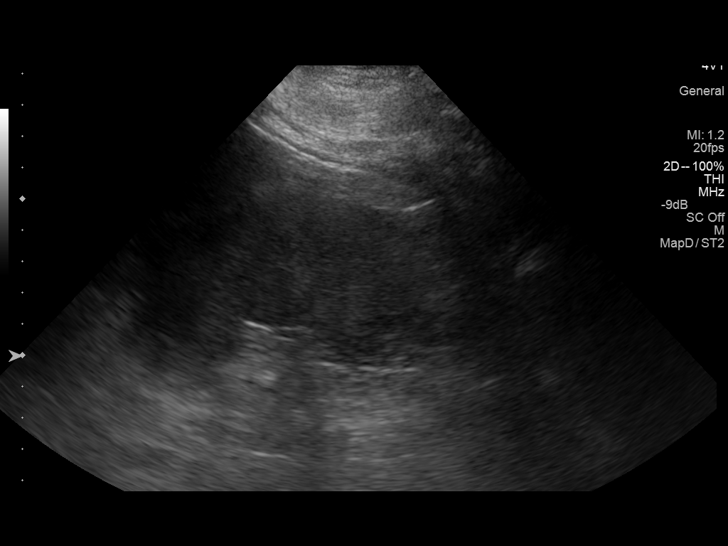
[im 26/41]
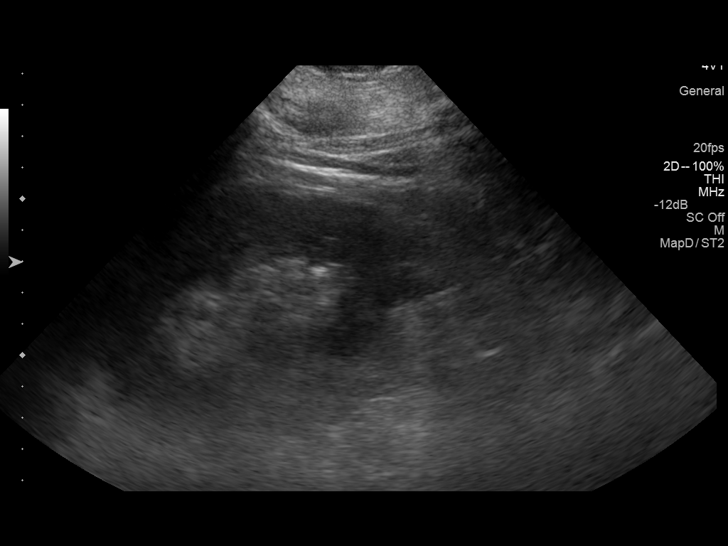
[im 27/41]
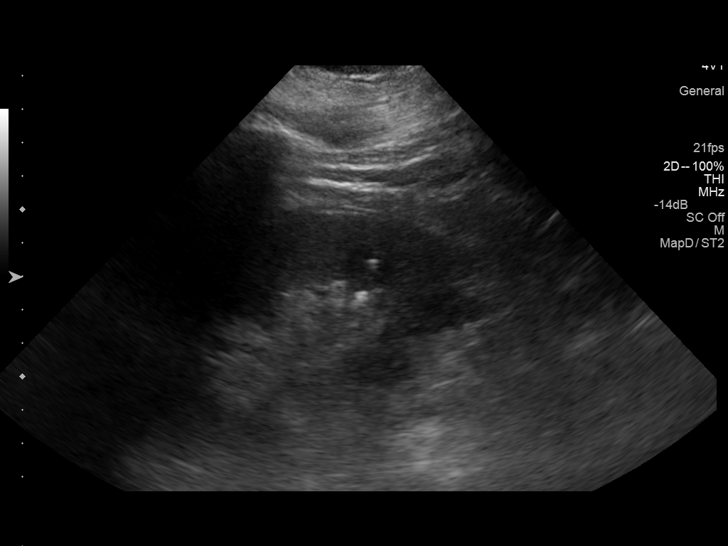
[im 31/41]
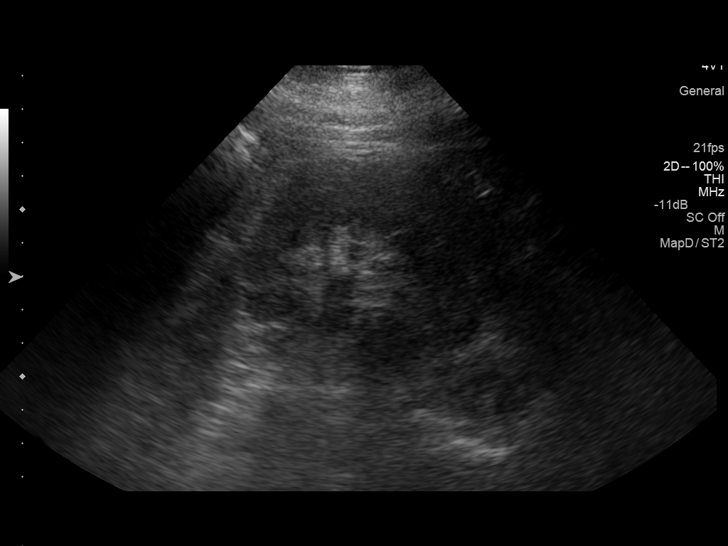
[im 34/41]
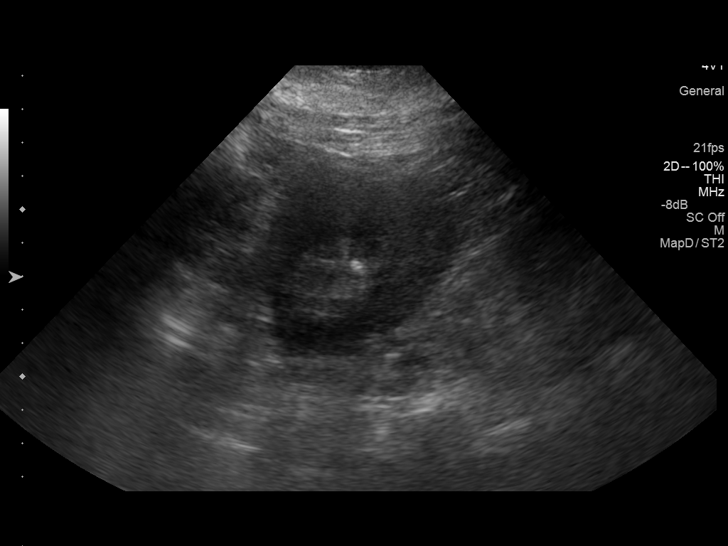
[im 37/41]
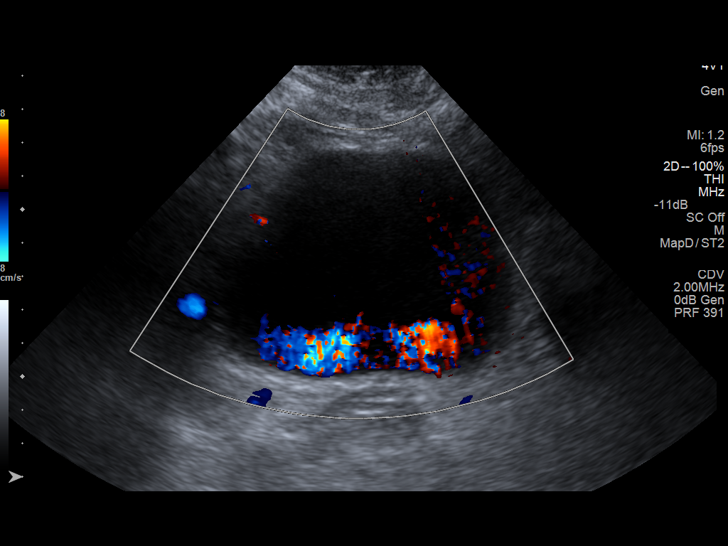
[im 41/41]
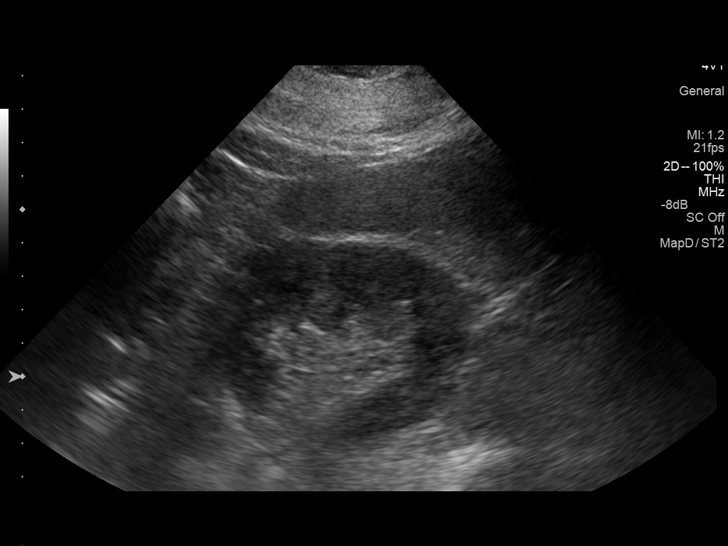

[14 of 25 positions shown; findings below may reference images not displayed]

FINDINGS: Right Kidney:

Length: 10.8 cm. Echogenicity within normal limits. No mass or
hydronephrosis visualized.

Left Kidney:

Length: 10.4 cm.. Mild left renal pelvic fullness identified,
resolving after voiding. 5 mm nonobstructing left lower pole renal
calculus image 27. Trace left perinephric fluid is identified
particularly at the level of the lower pole.

Bladder:

Appears normal for degree of bladder distention. Bilateral ureteral
jets visualized.
IMPRESSION: Nonobstructing left lower renal pole calculus, but trace nonspecific
left perinephric fluid is identified. This may be a chronic finding
in some patients, although could indicate sequela of recent stone
passage or infectious or inflammatory etiology affecting the left
kidney. Consider abdominal radiograph for evaluation for possible
ureteral calculus or other acute abnormality.

## 2014-04-07 ENCOUNTER — Ambulatory Visit (INDEPENDENT_AMBULATORY_CARE_PROVIDER_SITE_OTHER): Payer: BC Managed Care – PPO | Admitting: Podiatry

## 2014-04-07 ENCOUNTER — Ambulatory Visit (INDEPENDENT_AMBULATORY_CARE_PROVIDER_SITE_OTHER): Payer: BC Managed Care – PPO

## 2014-04-07 ENCOUNTER — Encounter: Payer: Self-pay | Admitting: Podiatry

## 2014-04-07 VITALS — BP 137/78 | HR 60 | Resp 16 | Ht 67.0 in | Wt 326.0 lb

## 2014-04-07 DIAGNOSIS — M722 Plantar fascial fibromatosis: Secondary | ICD-10-CM

## 2014-04-07 MED ORDER — TRIAMCINOLONE ACETONIDE 10 MG/ML IJ SUSP: 10.0000 mg | Freq: Once | INTRAMUSCULAR | Status: DC

## 2014-04-07 NOTE — Patient Instructions (Signed)
Plantar Fasciitis Plantar fasciitis is a common condition that causes foot pain. It is soreness (inflammation) of the band of tough fibrous tissue on the bottom of the foot that runs from the heel bone (calcaneus) to the ball of the foot. The cause of this soreness may be from excessive standing, poor fitting shoes, running on hard surfaces, being overweight, having an abnormal walk, or overuse (this is common in runners) of the painful foot or feet. It is also common in aerobic exercise dancers and ballet dancers. SYMPTOMS  Most people with plantar fasciitis complain of:  Severe pain in the morning on the bottom of their foot especially when taking the first steps out of bed. This pain recedes after a few minutes of walking.  Severe pain is experienced also during walking following a long period of inactivity.  Pain is worse when walking barefoot or up stairs DIAGNOSIS   Your caregiver will diagnose this condition by examining and feeling your foot.  Special tests such as X-rays of your foot, are usually not needed. PREVENTION   Consult a sports medicine professional before beginning a new exercise program.  Walking programs offer a good workout. With walking there is a lower chance of overuse injuries common to runners. There is less impact and less jarring of the joints.  Begin all new exercise programs slowly. If problems or pain develop, decrease the amount of time or distance until you are at a comfortable level.  Wear good shoes and replace them regularly.  Stretch your foot and the heel cords at the back of the ankle (Achilles tendon) both before and after exercise.  Run or exercise on even surfaces that are not hard. For example, asphalt is better than pavement.  Do not run barefoot on hard surfaces.  If using a treadmill, vary the incline.  Do not continue to workout if you have foot or joint problems. Seek professional help if they do not improve. HOME CARE INSTRUCTIONS     Avoid activities that cause you pain until you recover.  Use ice or cold packs on the problem or painful areas after working out.  Only take over-the-counter or prescription medicines for pain, discomfort, or fever as directed by your caregiver.  Soft shoe inserts or athletic shoes with air or gel sole cushions may be helpful.  If problems continue or become more severe, consult a sports medicine caregiver or your own health care provider. Cortisone is a potent anti-inflammatory medication that may be injected into the painful area. You can discuss this treatment with your caregiver. MAKE SURE YOU:   Understand these instructions.  Will watch your condition.  Will get help right away if you are not doing well or get worse. Document Released: 05/14/2001 Document Revised: 11/11/2011 Document Reviewed: 07/13/2008 [employer] Patient Information 2015 [employer]. This information is not intended to replace advice given to you by your health care provider. Make sure you discuss any questions you have with your health care provider.   Plantar Fasciitis (Heel Spur Syndrome) with Rehab The plantar fascia is a fibrous, ligament-like, soft-tissue structure that spans the bottom of the foot. Plantar fasciitis is a condition that causes pain in the foot due to inflammation of the tissue. SYMPTOMS   Pain and tenderness on the underneath side of the foot.  Pain that worsens with standing or walking. CAUSES  Plantar fasciitis is caused by irritation and injury to the plantar fascia on the underneath side of the foot. Common mechanisms of injury include:    Direct trauma to bottom of the foot.  Damage to a small nerve that runs under the foot where the main fascia attaches to the heel bone.  Stress placed on the plantar fascia due to bone spurs. RISK INCREASES WITH:   Activities that place stress on the plantar fascia (running, jumping, pivoting, or cutting).  Poor strength and  flexibility.  Improperly fitted shoes.  Tight calf muscles.  Flat feet.  Failure to warm-up properly before activity.  Obesity. PREVENTION  Warm up and stretch properly before activity.  Allow for adequate recovery between workouts.  Maintain physical fitness:  Strength, flexibility, and endurance.  Cardiovascular fitness.  Maintain a health body weight.  Avoid stress on the plantar fascia.  Wear properly fitted shoes, including arch supports for individuals who have flat feet. PROGNOSIS  If treated properly, then the symptoms of plantar fasciitis usually resolve without surgery. However, occasionally surgery is necessary. RELATED COMPLICATIONS   Recurrent symptoms that may result in a chronic condition.  Problems of the lower back that are caused by compensating for the injury, such as limping.  Pain or weakness of the foot during push-off following surgery.  Chronic inflammation, scarring, and partial or complete fascia tear, occurring more often from repeated injections. TREATMENT  Treatment initially involves the use of ice and medication to help reduce pain and inflammation. The use of strengthening and stretching exercises may help reduce pain with activity, especially stretches of the Achilles tendon. These exercises may be performed at home or with a therapist. Your caregiver may recommend that you use heel cups of arch supports to help reduce stress on the plantar fascia. Occasionally, corticosteroid injections are given to reduce inflammation. If symptoms persist for greater than 6 months despite non-surgical (conservative), then surgery may be recommended.  MEDICATION   If pain medication is necessary, then nonsteroidal anti-inflammatory medications, such as aspirin and ibuprofen, or other minor pain relievers, such as acetaminophen, are often recommended.  Do not take pain medication within 7 days before surgery.  Prescription pain relievers may be given if  deemed necessary by your caregiver. Use only as directed and only as much as you need.  Corticosteroid injections may be given by your caregiver. These injections should be reserved for the most serious cases, because they may only be given a certain number of times. HEAT AND COLD  Cold treatment (icing) relieves pain and reduces inflammation. Cold treatment should be applied for 10 to 15 minutes every 2 to 3 hours for inflammation and pain and immediately after any activity that aggravates your symptoms. Use ice packs or massage the area with a piece of ice (ice massage).  Heat treatment may be used prior to performing the stretching and strengthening activities prescribed by your caregiver, physical therapist, or athletic trainer. Use a heat pack or soak the injury in warm water. SEEK IMMEDIATE MEDICAL CARE IF:  Treatment seems to offer no benefit, or the condition worsens.  Any medications produce adverse side effects. EXERCISES RANGE OF MOTION (ROM) AND STRETCHING EXERCISES - Plantar Fasciitis (Heel Spur Syndrome) These exercises may help you when beginning to rehabilitate your injury. Your symptoms may resolve with or without further involvement from your physician, physical therapist or athletic trainer. While completing these exercises, remember:   Restoring tissue flexibility helps normal motion to return to the joints. This allows healthier, less painful movement and activity.  An effective stretch should be held for at least 30 seconds.  A stretch should never be painful. You should   only feel a gentle lengthening or release in the stretched tissue. RANGE OF MOTION - Toe Extension, Flexion  Sit with your right / left leg crossed over your opposite knee.  Grasp your toes and gently pull them back toward the top of your foot. You should feel a stretch on the bottom of your toes and/or foot.  Hold this stretch for __________ seconds.  Now, gently pull your toes toward the bottom  of your foot. You should feel a stretch on the top of your toes and or foot.  Hold this stretch for __________ seconds. Repeat __________ times. Complete this stretch __________ times per day.  RANGE OF MOTION - Ankle Dorsiflexion, Active Assisted  Remove shoes and sit on a chair that is preferably not on a carpeted surface.  Place right / left foot under knee. Extend your opposite leg for support.  Keeping your heel down, slide your right / left foot back toward the chair until you feel a stretch at your ankle or calf. If you do not feel a stretch, slide your bottom forward to the edge of the chair, while still keeping your heel down.  Hold this stretch for __________ seconds. Repeat __________ times. Complete this stretch __________ times per day.  STRETCH - Gastroc, Standing  Place hands on wall.  Extend right / left leg, keeping the front knee somewhat bent.  Slightly point your toes inward on your back foot.  Keeping your right / left heel on the floor and your knee straight, shift your weight toward the wall, not allowing your back to arch.  You should feel a gentle stretch in the right / left calf. Hold this position for __________ seconds. Repeat __________ times. Complete this stretch __________ times per day. STRETCH - Soleus, Standing  Place hands on wall.  Extend right / left leg, keeping the other knee somewhat bent.  Slightly point your toes inward on your back foot.  Keep your right / left heel on the floor, bend your back knee, and slightly shift your weight over the back leg so that you feel a gentle stretch deep in your back calf.  Hold this position for __________ seconds. Repeat __________ times. Complete this stretch __________ times per day. STRETCH - Gastrocsoleus, Standing  Note: This exercise can place a lot of stress on your foot and ankle. Please complete this exercise only if specifically instructed by your caregiver.   Place the ball of your right  / left foot on a step, keeping your other foot firmly on the same step.  Hold on to the wall or a rail for balance.  Slowly lift your other foot, allowing your body weight to press your heel down over the edge of the step.  You should feel a stretch in your right / left calf.  Hold this position for __________ seconds.  Repeat this exercise with a slight bend in your right / left knee. Repeat __________ times. Complete this stretch __________ times per day.  STRENGTHENING EXERCISES - Plantar Fasciitis (Heel Spur Syndrome)  These exercises may help you when beginning to rehabilitate your injury. They may resolve your symptoms with or without further involvement from your physician, physical therapist or athletic trainer. While completing these exercises, remember:   Muscles can gain both the endurance and the strength needed for everyday activities through controlled exercises.  Complete these exercises as instructed by your physician, physical therapist or athletic trainer. Progress the resistance and repetitions only as guided. STRENGTH - Towel   Curls  Sit in a chair positioned on a non-carpeted surface.  Place your foot on a towel, keeping your heel on the floor.  Pull the towel toward your heel by only curling your toes. Keep your heel on the floor.  If instructed by your physician, physical therapist or athletic trainer, add ____________________ at the end of the towel. Repeat __________ times. Complete this exercise __________ times per day. STRENGTH - Ankle Inversion  Secure one end of a rubber exercise band/tubing to a fixed object (table, pole). Loop the other end around your foot just before your toes.  Place your fists between your knees. This will focus your strengthening at your ankle.  Slowly, pull your big toe up and in, making sure the band/tubing is positioned to resist the entire motion.  Hold this position for __________ seconds.  Have your muscles resist the  band/tubing as it slowly pulls your foot back to the starting position. Repeat __________ times. Complete this exercises __________ times per day.  Document Released: 08/19/2005 Document Revised: 11/11/2011 Document Reviewed: 12/01/2008 [employer] Patient Information 2015 [employer]. This information is not intended to replace advice given to you by your health care provider. Make sure you discuss any questions you have with your health care provider.   

## 2014-04-07 NOTE — Progress Notes (Signed)
   Subjective:    Patient ID: Sharon Holden, female    DOB: 10/27/1960, 53 y.o.   MRN: [id number]  HPI Comments: 53 year old female presents stating that she has pain in both of her heels with the left more severe than the right which has been present for about 5 months. She states the pain is worse in the morning with the first step, or after she has been sitting at work for a period of time and starts to ambulate. As she walks, the pain will subside. She denies any history of trauma to the area. She has a history of heel pain approximately 5 years ago for which she was given insoles for, and is currently not wearing. She presents today wearing a sandal. No other complaints at this time.   Foot Pain      Review of Systems  Genitourinary: Positive for urgency.  Musculoskeletal:       Joint pain Difficulty walking  All other systems reviewed and are negative.      Objective:   Physical Exam  Nursing note and vitals reviewed. Constitutional: She is oriented to person, place, and time. She appears well-developed and well-nourished.  Musculoskeletal: She exhibits no edema.  Tenderness to palpation over the plantar medial tubercle of the left calcaneous at the insertion of the plantar fascia. Currently no discomfort with palpation of the right plantar fascia insertion. No pain with lateral compression of the calcaneous b/l. No other areas of discomfort on palpation. Decrease in the medial arch height b/l upon weightbearing. MMT 5/5 bl. +Equinus b/l.   Neurological: She is alert and oriented to person, place, and time.  Skin: Skin is warm.  DP/PT pulses palpable b/l. CRT <3 sec.  Protective sensation intact.       Assessment & Plan:  53 year old female with L plantar fasciitis, R currently asymptomatic.  -X-Rays obtained which revealed no acute fracture. +heel spur/retrocalcneal exostosis/Hagulands deformity b/l.  -Conservative vs. Surgical intervention discussed with the patient in  detail. At this time, would recommend a steroid injection to the L heel for which the patient agrees. After sterile skin preparation, a total of 2.5cc mixture of kenalog 10, 0.5% Marcaine pain, and 1% Lidocaine plain was infiltrated into the plantar medial tubercle of the calcaneous at the insertion of the plantar fascia. Pt tolerated the injection well without complications. -Dispensed plantar fascia brace -Discussed appropriate shoegear -Pt to start stretching exercises -Continue Mobic  -Will reappoint for 1 month. If no improvement at that time, will consider another steroid injection, night splint, medrol dose pack, and possible orthotics.

## 2014-04-08 ENCOUNTER — Ambulatory Visit: Payer: Self-pay | Admitting: Podiatry

## 2014-05-05 ENCOUNTER — Ambulatory Visit: Payer: BC Managed Care – PPO | Admitting: Podiatry

## 2014-05-10 ENCOUNTER — Ambulatory Visit: Payer: BC Managed Care – PPO | Admitting: Podiatry

## 2014-10-05 ENCOUNTER — Ambulatory Visit: Payer: Self-pay | Admitting: Obstetrics and Gynecology

## 2014-10-05 LAB — BASIC METABOLIC PANEL WITH GFR
Anion Gap: 3 — ABNORMAL LOW
BUN: 9 mg/dL
Calcium, Total: 9.1 mg/dL
Chloride: 106 mmol/L
Co2: 31 mmol/L
Creatinine: 0.94 mg/dL
EGFR (African American): >60
EGFR (Non-African Amer.): >60
Glucose: 99 mg/dL
Osmolality: 278
Potassium: 4.1 mmol/L
Sodium: 140 mmol/L

## 2014-10-05 LAB — CBC
HCT: 40.7 % (ref 35.0–47.0)
HGB: 13.8 g/dL (ref 12.0–16.0)
MCH: 30.5 pg (ref 26.0–34.0)
MCHC: 34 g/dL (ref 32.0–36.0)
MCV: 90 fL (ref 80–100)
Platelet: 194 10*3/uL (ref 150–440)
RBC: 4.53 10*6/uL (ref 3.80–5.20)
RDW: 14.4 % (ref 11.5–14.5)
WBC: 6 10*3/uL (ref 3.6–11.0)

## 2014-10-05 IMAGING — CR DG CHEST 2V
1 series · 2 of 2 positions shown · non-contrast
Comparison: PA and lateral chest x-ray [DATE].

CLINICAL DATA: Preoperative exam prior to it gynecological surgery
; history of hypertension, nonsmoker.

EXAM:
CHEST  2 VIEW

[Series 1: dxr chest pa (or ap) and lateral · 0.14mm/px · 2 of 2 slices shown]
[im 1/2]
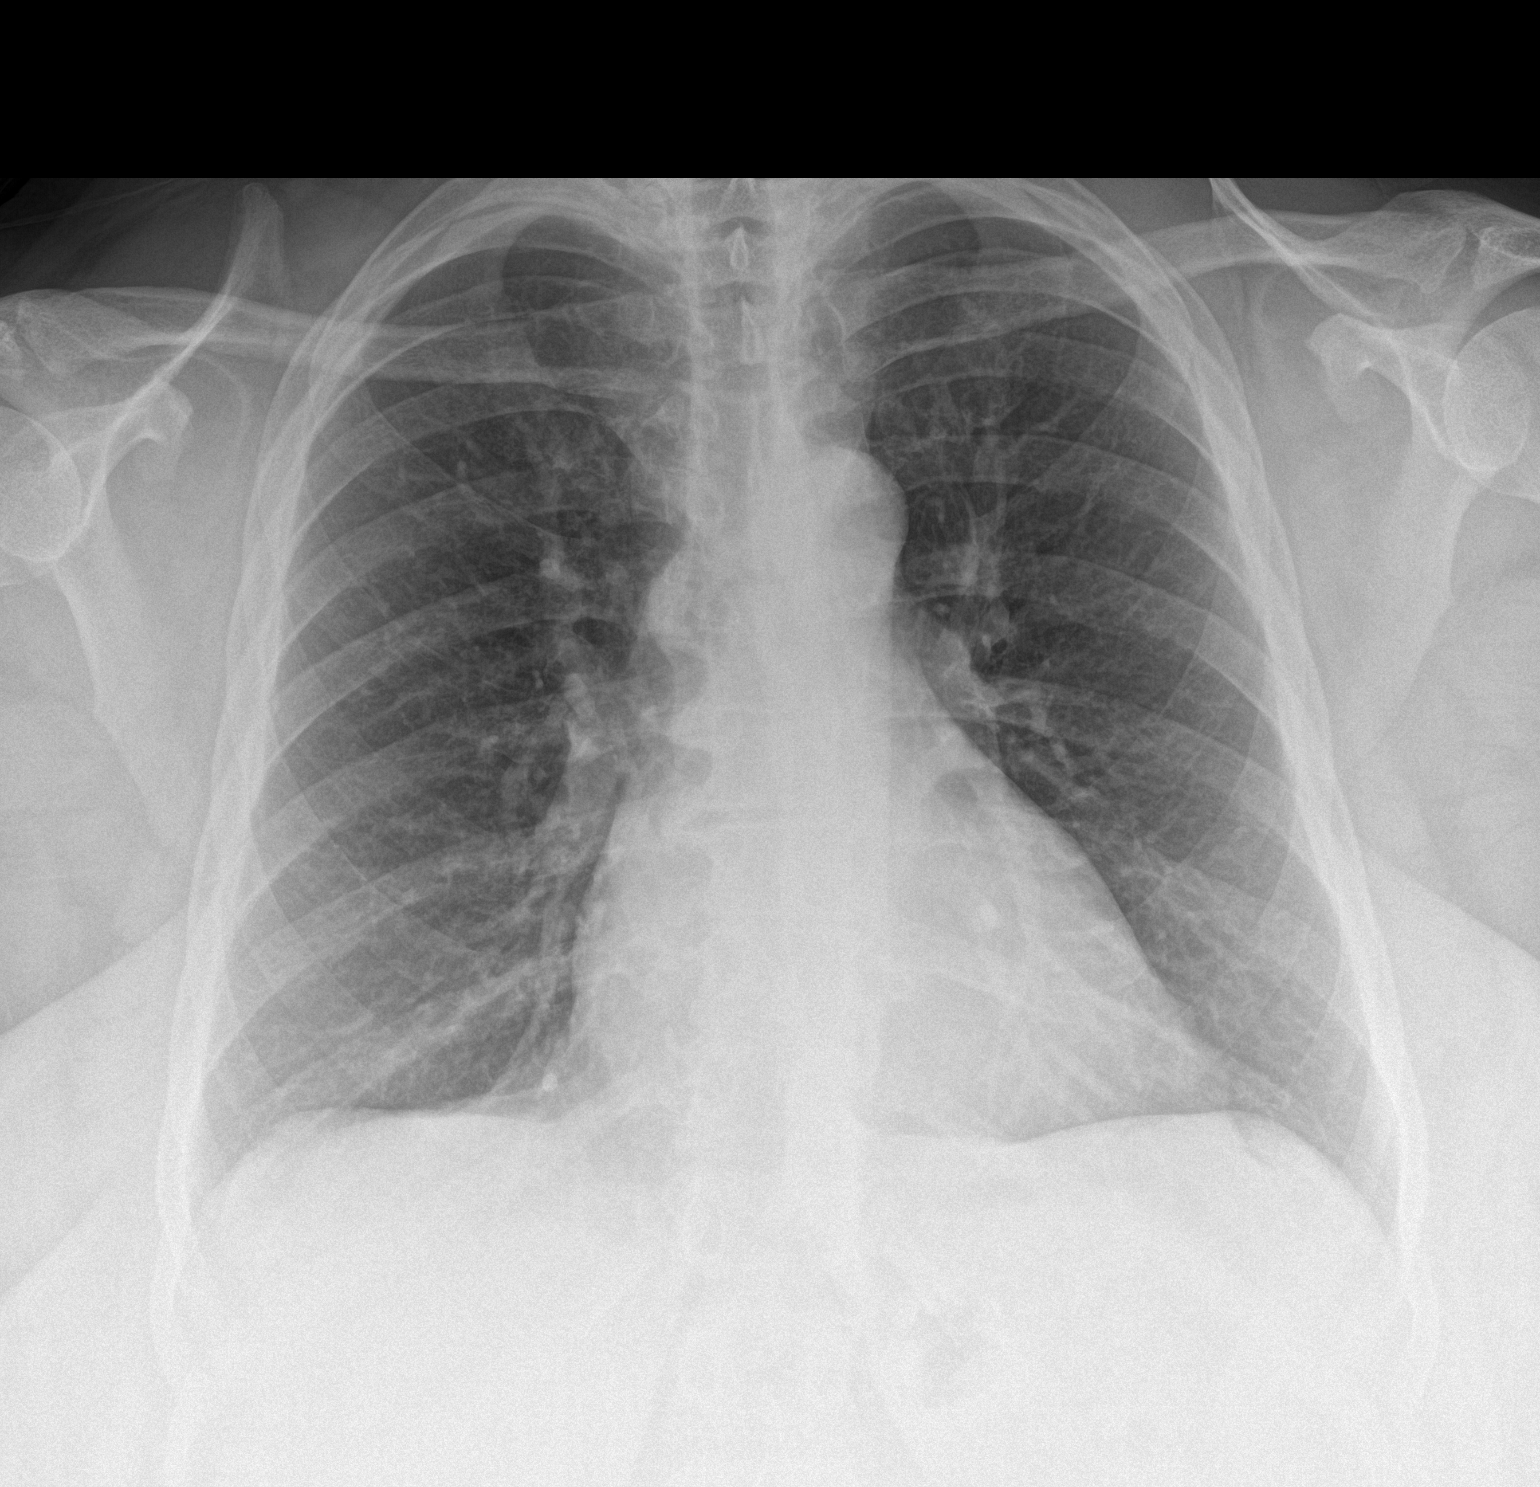
[im 2/2]
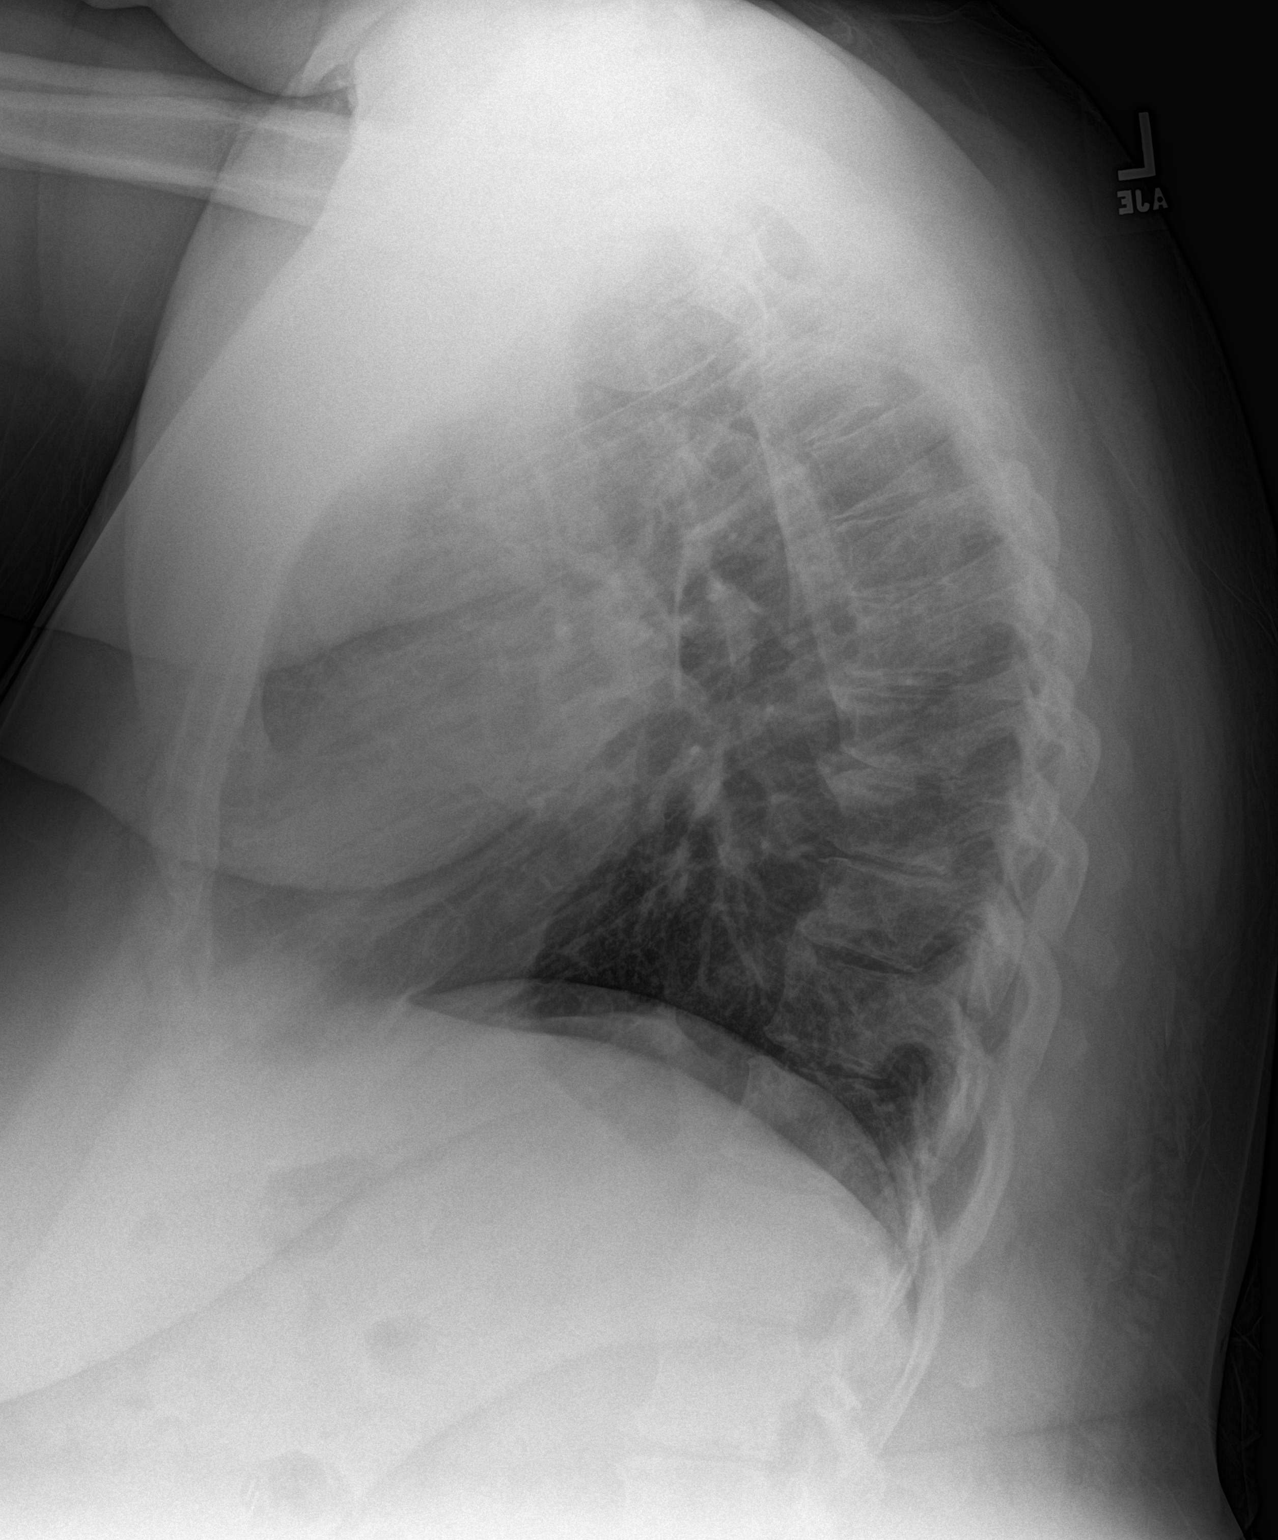

[2 of 2 positions shown; findings below may reference images not displayed]

FINDINGS: The lungs are adequately inflated. There is no focal infiltrate.
There is no pleural effusion or pneumothorax. The cardiac silhouette
is top-normal in size. The pulmonary vascularity is not engorged.
The mediastinum is normal in width. The bony thorax is unremarkable.
IMPRESSION: Mild hyperinflation consistent with COPD. There is no active
cardiopulmonary disease.

## 2014-10-13 ENCOUNTER — Ambulatory Visit: Payer: Self-pay | Admitting: Obstetrics and Gynecology

## 2014-12-11 ENCOUNTER — Emergency Department
Admit: 2014-12-11 | Disposition: A | Discharge status: Home / Self Care | Payer: Self-pay | Admitting: Emergency Medicine

## 2014-12-11 LAB — CBC
HCT: 40.1 % (ref 35.0–47.0)
HGB: 13.3 g/dL (ref 12.0–16.0)
MCH: 30.1 pg (ref 26.0–34.0)
MCHC: 33.2 g/dL (ref 32.0–36.0)
MCV: 91 fL (ref 80–100)
Platelet: 177 10*3/uL (ref 150–440)
RBC: 4.42 10*6/uL (ref 3.80–5.20)
RDW: 14.3 % (ref 11.5–14.5)
WBC: 7.2 10*3/uL (ref 3.6–11.0)

## 2014-12-11 LAB — COMPREHENSIVE METABOLIC PANEL
Albumin: 4.4 g/dL
Alkaline Phosphatase: 89 U/L
Anion Gap: 6 — ABNORMAL LOW (ref 7–16)
BUN: 13 mg/dL
Bilirubin,Total: 0.8 mg/dL
Calcium, Total: 9.2 mg/dL
Chloride: 108 mmol/L
Co2: 28 mmol/L
Creatinine: 1.06 mg/dL — ABNORMAL HIGH
EGFR (African American): >60
EGFR (Non-African Amer.): >60
Glucose: 117 mg/dL — ABNORMAL HIGH
Potassium: 3.9 mmol/L
SGOT(AST): 18 U/L
SGPT (ALT): 13 U/L — ABNORMAL LOW
Sodium: 142 mmol/L
Total Protein: 7.3 g/dL

## 2014-12-11 LAB — URINALYSIS, COMPLETE
Bilirubin,UR: NEGATIVE
Glucose,UR: NEGATIVE mg/dL (ref 0–75)
Ketone: NEGATIVE
Leukocyte Esterase: NEGATIVE
Nitrite: NEGATIVE
Ph: 6 (ref 4.5–8.0)
Protein: 100
Specific Gravity: 1.028 (ref 1.003–1.030)

## 2014-12-13 LAB — URINE CULTURE

## 2014-12-26 LAB — SURGICAL PATHOLOGY

## 2015-01-01 NOTE — Op Note (Signed)
PATIENT NAME:  Sharon Holden, Sharon Holden MR#:  [id number] DATE OF BIRTH:  1961/05/11  DATE OF PROCEDURE:  10/13/2014  PREOPERATIVE DIAGNOSES:  1.  Postmenopausal bleeding.  2.  Body mass index 52.  3.  Thickened endometrial stripe.   POSTOPERATIVE DIAGNOSES:  1.  Postmenopausal bleeding.  2.  Body mass index 52.  3.  Thickened endometrial stripe.   OPERATION: Hysteroscopy and dilation and curettage.   ANESTHESIA: General.   SPECIMENS: Endometrial curettings to pathology.   ESTIMATED BLOOD LOSS: 10 mL.   INTRAVENOUS FLUIDS: 500 mL crystalloid.   URINE OUTPUT: No in and out catheterization was done.   ANTIBIOTICS: Not indicated.   COMPLICATIONS: None.   DISPOSITION: Stable to PACU.   FINDINGS: On exam under anesthesia the cervix was noted to be deep in the vagina and very anterior. On speculum exam a small atrophic-appearing cervix which easily dilated up to a 16 was noted. The patient sounded to 8.5 cm. On diagnostic hysteroscopy normal-appearing cervical canal and very atrophic-appearing uterus was noted with bilateral tubal ostia seen. Minimal amount of endometrial curettings were noted on D and C.   DESCRIPTION OF PROCEDURE: The patient was taken to the operating room where the anesthesia was administered. She was then prepped and draped in normal sterile fashion in dorsal lithotomy position in [place]. Next speculum was then inserted and the anterior lip of the cervix was grasped with a single-tooth tenaculum and the uterus sounded to 8.5 cm. Next the cervix easily dilated up to a 16 dilator and the diagnostic hysteroscope was then introduced with the above-noted findings. Next the hysteroscope was then removed and the curettings were obtained. The single-tooth tenaculum removed and excellent hemostasis noted from all operative sites. Sponge, lap, needle, and instrument counts were correct x 2. The patient was taken to the recovery room, awake, alert, breathing independently, in  stable condition.     ____________________________ Aletha Halim, MD cp:bu D: 10/13/2014 15:00:24 ET T: 10/13/2014 19:30:49 ET JOB#: [id number]  cc: Aletha Halim, MD, <Dictator> Aletha Halim MD ELECTRONICALLY SIGNED 10/23/2014 12:34

## 2016-06-13 ENCOUNTER — Emergency Department
Admission: EM | Admit: 2016-06-13 | Discharge: 2016-06-14 | Disposition: A | Discharge status: Home / Self Care | Payer: BLUE CROSS/BLUE SHIELD | Attending: Emergency Medicine | Admitting: Emergency Medicine

## 2016-06-13 ENCOUNTER — Emergency Department: Payer: BLUE CROSS/BLUE SHIELD

## 2016-06-13 ENCOUNTER — Encounter: Payer: Self-pay | Admitting: Urgent Care

## 2016-06-13 DIAGNOSIS — N1330 Unspecified hydronephrosis: Secondary | ICD-10-CM

## 2016-06-13 DIAGNOSIS — Z79899 Other long term (current) drug therapy: Secondary | ICD-10-CM | POA: Diagnosis not present

## 2016-06-13 DIAGNOSIS — R109 Unspecified abdominal pain: Secondary | ICD-10-CM

## 2016-06-13 DIAGNOSIS — I10 Essential (primary) hypertension: Secondary | ICD-10-CM | POA: Diagnosis not present

## 2016-06-13 DIAGNOSIS — N201 Calculus of ureter: Secondary | ICD-10-CM | POA: Diagnosis not present

## 2016-06-13 HISTORY — DX: Unspecified osteoarthritis, unspecified site: M19.90

## 2016-06-13 HISTORY — DX: Essential (primary) hypertension: I10

## 2016-06-13 HISTORY — DX: Calculus of kidney: N20.0

## 2016-06-13 LAB — BASIC METABOLIC PANEL
Anion gap: 7 (ref 5–15)
BUN: 13 mg/dL (ref 6–20)
CO2: 24 mmol/L (ref 22–32)
Calcium: 9.2 mg/dL (ref 8.9–10.3)
Chloride: 107 mmol/L (ref 101–111)
Creatinine, Ser: 1.14 mg/dL — ABNORMAL HIGH (ref 0.44–1.00)
GFR calc Af Amer: >60 mL/min (ref >60)
GFR calc non Af Amer: 54 mL/min — ABNORMAL LOW (ref >60)
Glucose, Bld: 164 mg/dL — ABNORMAL HIGH (ref 65–99)
Potassium: 3.5 mmol/L (ref 3.5–5.1)
Sodium: 138 mmol/L (ref 135–145)

## 2016-06-13 LAB — URINALYSIS COMPLETE WITH MICROSCOPIC (ARMC ONLY)
Bacteria, UA: NONE SEEN
Bilirubin Urine: NEGATIVE
Glucose, UA: NEGATIVE mg/dL
Ketones, ur: NEGATIVE mg/dL
Leukocytes, UA: NEGATIVE
Nitrite: NEGATIVE
Protein, ur: NEGATIVE mg/dL
Specific Gravity, Urine: 1.012 (ref 1.005–1.030)
pH: 8 (ref 5.0–8.0)

## 2016-06-13 LAB — PREGNANCY, URINE: Preg Test, Ur: NEGATIVE

## 2016-06-13 LAB — CBC
HCT: 38.8 % (ref 35.0–47.0)
Hemoglobin: 13.4 g/dL (ref 12.0–16.0)
MCH: 30.3 pg (ref 26.0–34.0)
MCHC: 34.5 g/dL (ref 32.0–36.0)
MCV: 87.8 fL (ref 80.0–100.0)
Platelets: 170 10*3/uL (ref 150–440)
RBC: 4.42 MIL/uL (ref 3.80–5.20)
RDW: 14.1 % (ref 11.5–14.5)
WBC: 10.1 10*3/uL (ref 3.6–11.0)

## 2016-06-13 IMAGING — CT CT RENAL STONE PROTOCOL
2 of 3 series · 16 of 46 positions shown, 18 images · non-contrast
Comparison: None.

CLINICAL DATA: Left flank pain since yesterday.

EXAM:
CT ABDOMEN AND PELVIS WITHOUT CONTRAST
TECHNIQUE: Multidetector CT imaging of the abdomen and pelvis was performed
following the standard protocol without IV contrast.

[Series 2: axial st · axial · 0.84mm/px · z∈[-64,+330]mm · 13 of 91 slices shown, 15 images]
[im 6/91  soft-tissue]
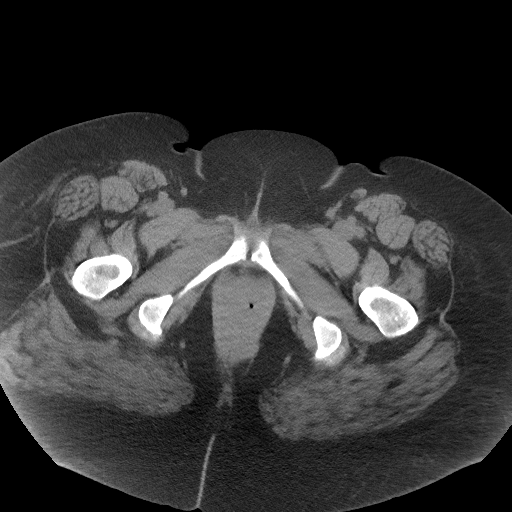
[im 6/91  bone]
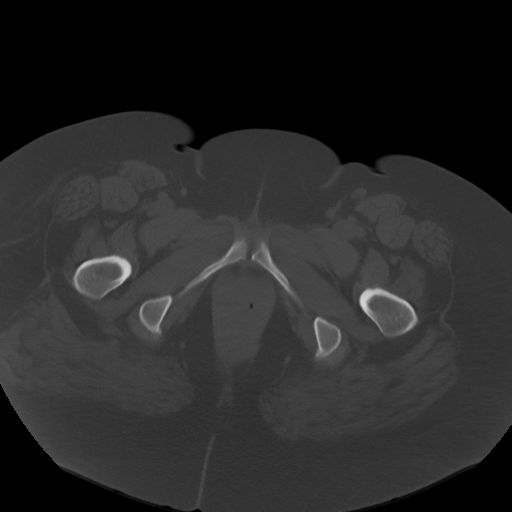
[im 12/91  soft-tissue]
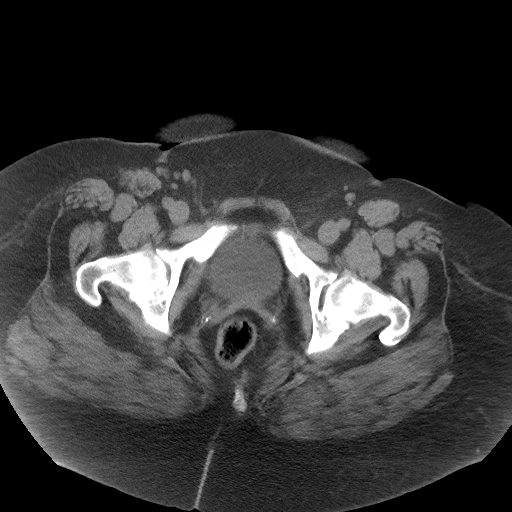
[im 18/91  soft-tissue]
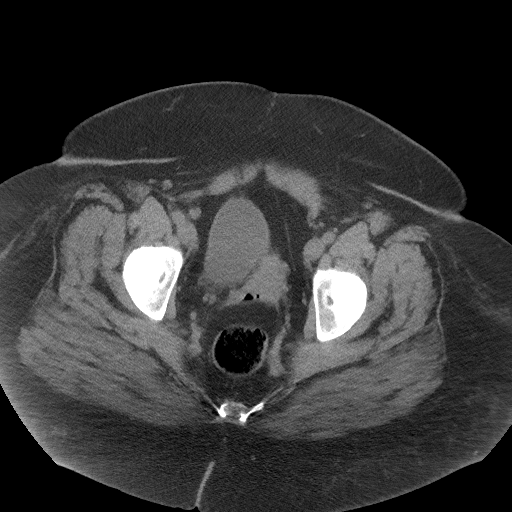
[im 27/91  soft-tissue]
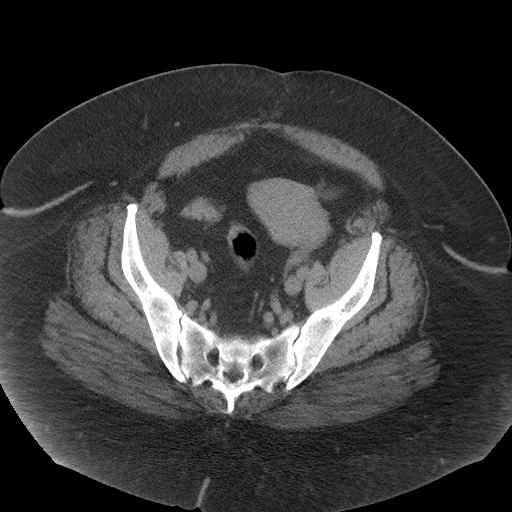
[im 32/91  soft-tissue]
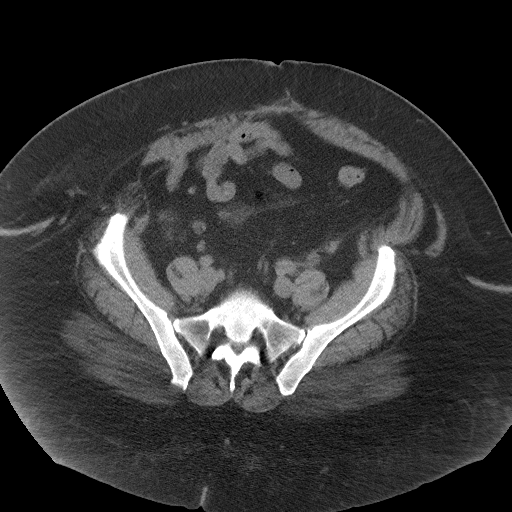
[im 38/91  soft-tissue]
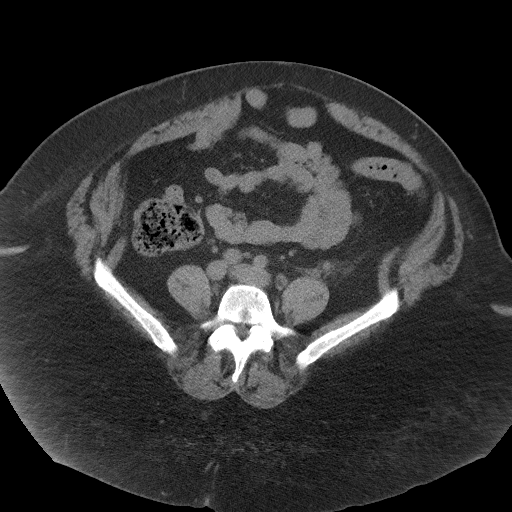
[im 47/91  soft-tissue]
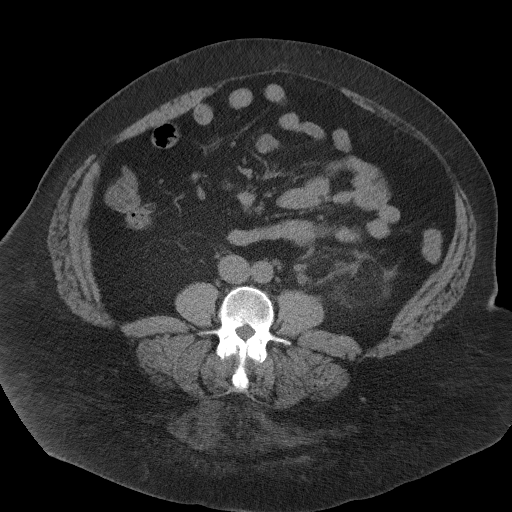
[im 53/91  soft-tissue]
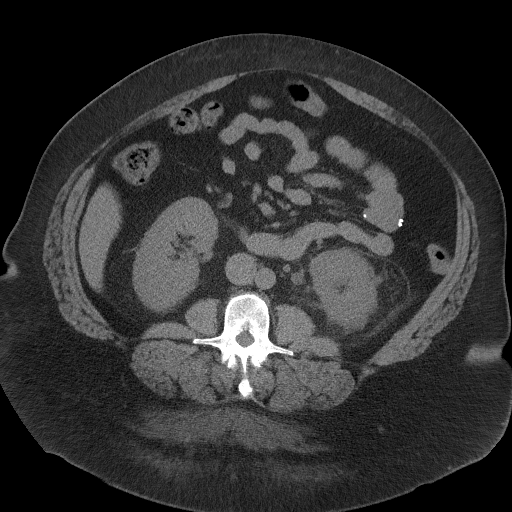
[im 59/91  soft-tissue]
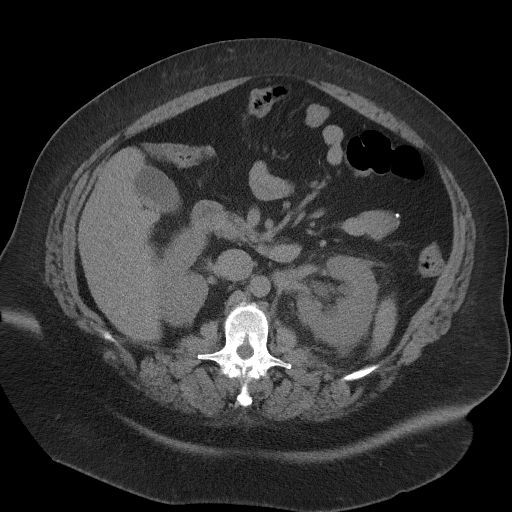
[im 59/91  bone]
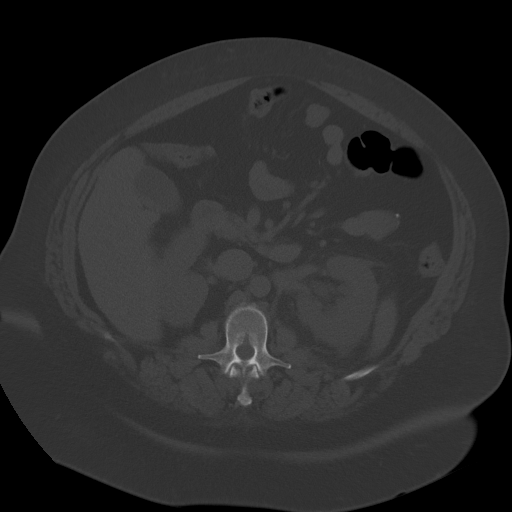
[im 64/91  soft-tissue]
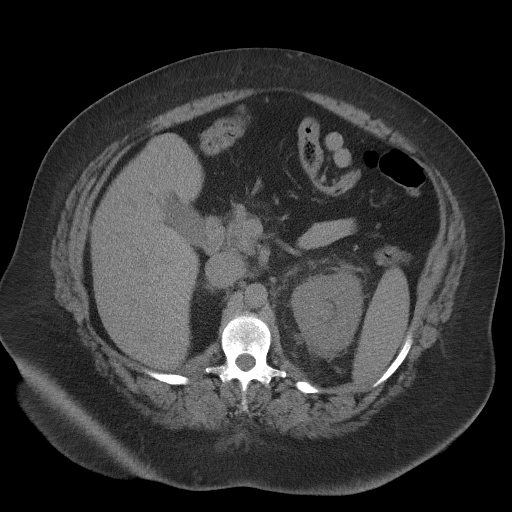
[im 73/91  soft-tissue]
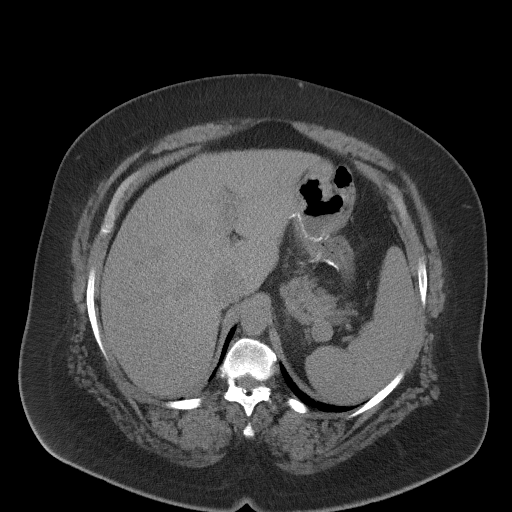
[im 79/91  soft-tissue]
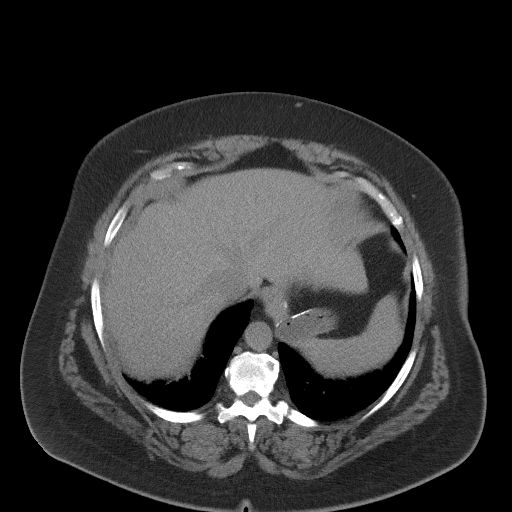
[im 85/91  soft-tissue]
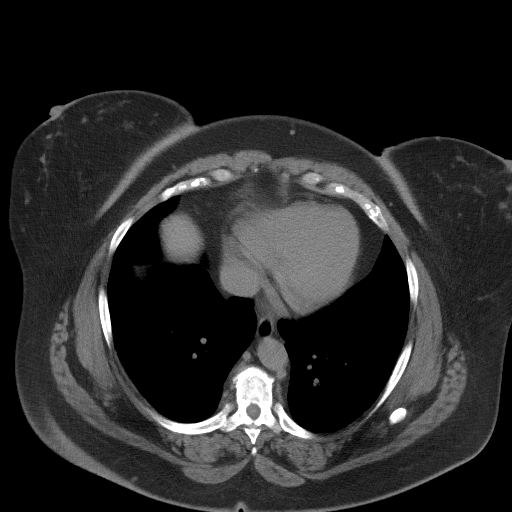

[Series 5: coronal · coronal · 0.77mm/px · 3 of 177 slices shown]
[im 59/177  soft-tissue]
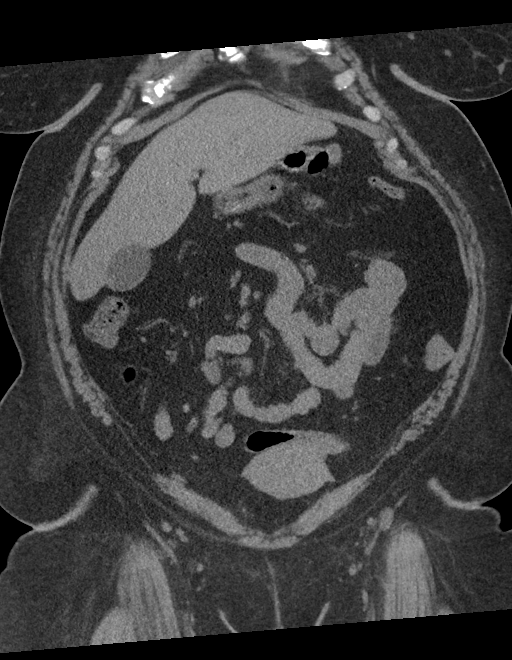
[im 79/177  soft-tissue]
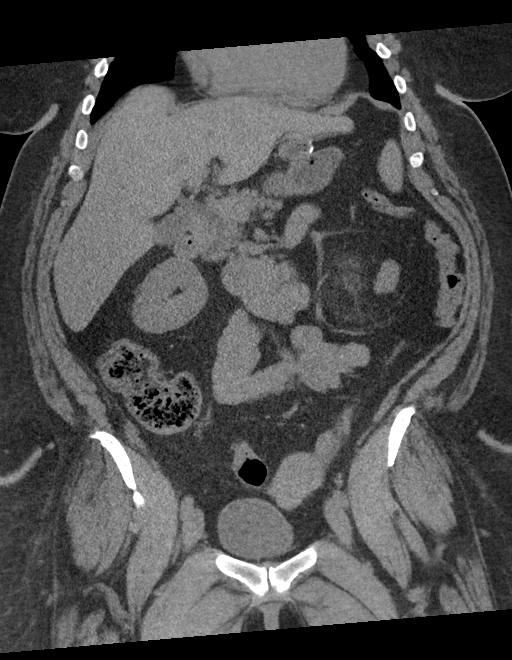
[im 98/177  soft-tissue]
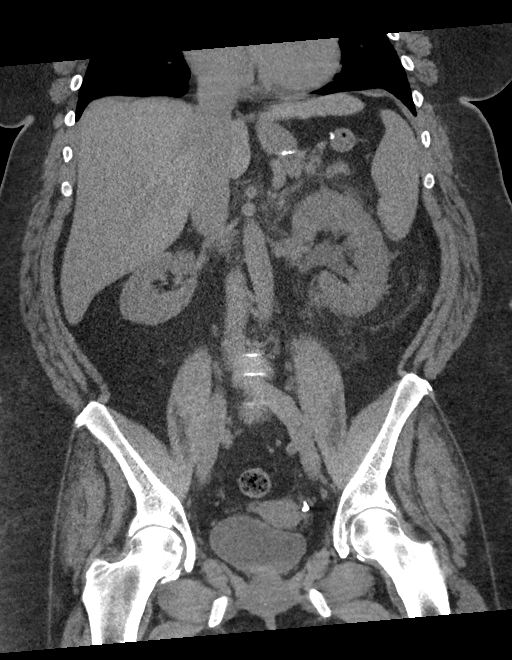

[16 of 46 positions shown; findings below may reference images not displayed]

FINDINGS: Lower chest: Minimal dependent atelectasis.

Hepatobiliary: The liver is enlarged measuring 20 cm cranial caudal.
There is diffusely decreased density consistent with steatosis.
Layering density in the gallbladder may be combination of small
stones and sludge. No pericholecystic inflammation. No biliary
dilatation.

Pancreas: No ductal dilatation or inflammation.

Spleen: Upper normal in size. No focal abnormality. Splenule noted
at the hilum.

Adrenals/Urinary Tract: A irregularly-shaped 9 x 8 mm stone in the
distal left ureter just proximal to the ureterovesicular junction
with moderate hydroureteronephrosis. Moderate perinephric and
periureteric edema. No additional nonobstructing stones in either
kidney. The bladder is physiologically distended, no bladder stone.
Normal adrenal glands.

Stomach/Bowel: Post gastric bypass with minimal fluid in the
excluded gastric remnant. No small bowel dilatation or obstruction.
Multifocal colonic diverticulosis without diverticulitis, most
prominent in the distal colon, with minimal hepatic flexure
involvement. Normal appendix.

Vascular/Lymphatic: No significant vascular findings are present. No
enlarged abdominal or pelvic lymph nodes.

Reproductive: Uterus and bilateral adnexa are unremarkable.

Other: Small fat containing umbilical hernia. No intra-abdominal
pelvic ascites.

Musculoskeletal: Multilevel degenerative change throughout spine.
Moderate degenerative change in the hips, right greater than left.
There are no acute or suspicious osseous abnormalities.
IMPRESSION: 1. Obstructing 9 x 8 mm stone in the distal left ureter with
moderate proximal hydroureteronephrosis, perinephric and
periureteric edema. The degree of surrounding soft tissue
inflammation can be seen in the setting of concurrent urinary tract
infection.
2. Hepatic steatosis and hepatomegaly. Probable sludge and small
stones in the gallbladder.
3. Colonic diverticulosis without diverticulitis.

## 2016-06-13 MED ORDER — SODIUM CHLORIDE 0.9 % IV BOLUS (SEPSIS)
1000.0000 mL | Freq: Once | INTRAVENOUS | Status: AC
  Administered 2016-06-13: 1000 mL via INTRAVENOUS

## 2016-06-13 MED ORDER — KETOROLAC TROMETHAMINE 30 MG/ML IJ SOLN
30.0000 mg | Freq: Once | INTRAMUSCULAR | Status: AC
  Administered 2016-06-13: 30 mg via INTRAVENOUS

## 2016-06-13 MED ORDER — CEPHALEXIN 500 MG PO CAPS
500.0000 mg | ORAL_CAPSULE | Freq: Once | ORAL | Status: AC
  Administered 2016-06-14: 500 mg via ORAL
  Filled 2016-06-13: qty 1

## 2016-06-13 MED ORDER — ONDANSETRON HCL 4 MG/2ML IJ SOLN
4.0000 mg | Freq: Once | INTRAMUSCULAR | Status: AC
Start: 2016-06-13 — End: 2016-06-13
  Administered 2016-06-13: 4 mg via INTRAVENOUS

## 2016-06-13 MED ORDER — ONDANSETRON HCL 4 MG/2ML IJ SOLN
INTRAMUSCULAR | Status: AC
  Administered 2016-06-13: 4 mg via INTRAVENOUS
  Filled 2016-06-13: qty 2

## 2016-06-13 MED ORDER — KETOROLAC TROMETHAMINE 30 MG/ML IJ SOLN
INTRAMUSCULAR | Status: AC
  Administered 2016-06-13: 30 mg via INTRAVENOUS
  Filled 2016-06-13: qty 1

## 2016-06-13 NOTE — ED Notes (Signed)
Assisted pt to bathroom

## 2016-06-13 NOTE — ED Notes (Signed)
Patient transported to CT 

## 2016-06-13 NOTE — ED Provider Notes (Signed)
[facility] Emergency Department Provider Note   ____________________________________________   First MD Initiated Contact with Patient 06/13/16 2140     (approximate)  I have reviewed the triage vital signs and the nursing notes.   HISTORY  Chief Complaint Back Pain; Flank Pain; Nausea; and Emesis   HPI Sharon Holden is a 55 y.o. female with a history of kidney stones was presenting with 1 day of left flank pain. She says the pain has been intermittent over the past day associated with vomiting when the pain is severe. She has the pain is 9 out of 10 right now and sharp. Denies any burning with urination. Denies any fever.   Past Medical History:  Diagnosis Date  . Arthritis   . Hypertension   . Kidney stones     There are no active problems to display for this patient.   Past Surgical History:  Procedure Laterality Date  . CESAREAN SECTION     x 2  . GASTRIC BYPASS      Prior to Admission medications   Medication Sig Start Date End Date Taking? Authorizing Provider  lisinopril-hydrochlorothiazide (PRINZIDE,ZESTORETIC) 20-12.5 MG per tablet Take 1 tablet by mouth daily.    Historical Provider, MD  meloxicam (MOBIC) 7.5 MG tablet Take 7.5 mg by mouth daily.    Historical Provider, MD    Allergies Review of patient's allergies indicates no known allergies.  No family history on file.  Social History Social History  Substance Use Topics  . Smoking status: Never Smoker  . Smokeless tobacco: Never Used  . Alcohol use Yes    Review of Systems Constitutional: No fever/chills Eyes: No visual changes. ENT: No sore throat. Cardiovascular: Denies chest pain. Respiratory: Denies shortness of breath. Gastrointestinal:  No diarrhea.  No constipation. Genitourinary: Negative for dysuria. Musculoskeletal: Negative for back pain. Skin: Negative for rash. Neurological: Negative for headaches, focal weakness or numbness.  10-point ROS  otherwise negative.  ____________________________________________   PHYSICAL EXAM:  VITAL SIGNS: ED Triage Vitals  Enc Vitals Group     BP 06/13/16 2028 (!) 163/77     Pulse Rate 06/13/16 2028 65     Resp 06/13/16 2028 16     Temp 06/13/16 2028 98.3 F (36.8 C)     Temp Source 06/13/16 2028 Oral     SpO2 06/13/16 2028 97 %     Weight 06/13/16 2029 (!) 340 lb (154.2 kg)     Height 06/13/16 2029 5\' 7"  (1.702 m)     Head Circumference --      Peak Flow --      Pain Score 06/13/16 2029 10     Pain Loc --      Pain Edu? --      Excl. in [place]? --     Constitutional: Alert and oriented. Uncomfortable appearing but without any distress. Eyes: Conjunctivae are normal. PERRL. EOMI. Head: Atraumatic. Nose: No congestion/rhinnorhea. Mouth/Throat: Mucous membranes are moist.  Neck: No stridor.   Cardiovascular: Normal rate, regular rhythm. Grossly normal heart sounds.   Respiratory: Normal respiratory effort.  No retractions. Lungs CTAB. Gastrointestinal: Soft and nontender. No distention. No abdominal bruits. Mild left-sided CVA tenderness palpation. Musculoskeletal: No lower extremity tenderness nor edema.  No joint effusions. Neurologic:  Normal speech and language. No gross focal neurologic deficits are appreciated. No gait instability. Skin:  Skin is warm, dry and intact. No rash noted. Psychiatric: Mood and affect are normal. Speech and behavior are normal.  ____________________________________________  LABS (all labs ordered are listed, but only abnormal results are displayed)  Labs Reviewed  URINALYSIS COMPLETEWITH MICROSCOPIC ([place]) - Abnormal; Notable for the following:       Result Value   Color, Urine STRAW (*)    APPearance CLEAR (*)    Hgb urine dipstick 2+ (*)    Squamous Epithelial / LPF 0-5 (*)    All other components within normal limits  BASIC METABOLIC PANEL - Abnormal; Notable for the following:    Glucose, Bld 164 (*)    Creatinine, Ser 1.14 (*)      GFR calc non Af Amer 54 (*)    All other components within normal limits  CBC  PREGNANCY, URINE   ____________________________________________  EKG   ____________________________________________  RADIOLOGY  Pending CAT scan read this time. ____________________________________________   PROCEDURES  Procedure(s) performed:   Procedures  Critical Care performed:   ____________________________________________   INITIAL IMPRESSION / ASSESSMENT AND PLAN / ED COURSE  Pertinent labs & imaging results that were available during my care of the patient were reviewed by me and considered in my medical decision making (see chart for details).  ----------------------------------------- 10:57 PM on 06/13/2016 -----------------------------------------  Patient's pain now a 1 out of 10. Awaiting formal CAT scan read. Signed out to Dr. Karma Greaser.  Clinical Course     ____________________________________________   FINAL CLINICAL IMPRESSION(S) / ED DIAGNOSES  Final diagnoses:  Left flank pain      NEW MEDICATIONS STARTED DURING THIS VISIT:  New Prescriptions   No medications on file     Note:  This document was prepared using Dragon voice recognition software and may include unintentional dictation errors.    Orbie Pyo, MD 06/13/16 2258

## 2016-06-13 NOTE — ED Provider Notes (Signed)
-----------------------------------------   10:55 PM on 06/13/2016 -----------------------------------------   Blood pressure (!) 163/77, pulse 65, temperature 98.3 F (36.8 C), temperature source Oral, resp. rate 16, height 5\' 7"  (1.702 m), weight (!) 154.2 kg, SpO2 97 %.  Assuming care from Dr. Clearnce Hasten.  In short, Sharon Holden is a 55 y.o. female with a chief complaint of Back Pain; Flank Pain; Nausea; and Emesis .  Refer to the original H&P for additional details.  The current plan of care is to follow up CT for probable ureterolithiasis.    Clinical Course  Value Comment By Time  CT RENAL STONE STUDY The patient has a very large stone with significant amount of surrounding inflammation and hydroureteronephrosis.  The radiologist suggested that that amount of inflammation suggests a concurrent urinary tract infection, but there is no evidence of that in her UA.  I reassessed the patient in person and her pain is completely gone at this point and she is stable with normal vital signs.  Given the size of the stone and the amount of surrounding inflammation and swelling, I have paged Dr. Pilar Jarvis to discuss, but since her pain is well controlled I am hopeful that she will be able to follow up as an outpatient possibly with some prophylactic antibiotics Hinda Kehr, MD 10/12 2329   I spoke by phone with Dr. Pilar Jarvis.  Given that the patient has normal vital signs, no leukocytosis, and her pain is well controlled, he agrees with the plan for outpatient follow-up.  He had no other specific recommendations other than usual.  I will go ahead and give the patient some antibiotics for prophylaxis although again her analysis is reassuring.  I gave the patient strict return precautions if she were to develop new or worsening symptoms. Hinda Kehr, MD 10/12 2343   I also sent a message via [facility] to Dr. Erlene Quan to try and facilitate follow up. Hinda Kehr, MD 10/13 [id number]      Hinda Kehr, MD 06/14/16  [phone]

## 2016-06-13 NOTE — ED Triage Notes (Signed)
Patient presents to the ED with c/o LEFT lower back pain with (+) radiation into flank area since yesterday. (+) N/V. Denies urinary symptoms. Patient states, "I think I have that kidney stone again". NAD noted in triage.

## 2016-06-14 ENCOUNTER — Encounter: Payer: Self-pay | Admitting: Urology

## 2016-06-14 ENCOUNTER — Ambulatory Visit (INDEPENDENT_AMBULATORY_CARE_PROVIDER_SITE_OTHER): Payer: BLUE CROSS/BLUE SHIELD | Admitting: Urology

## 2016-06-14 VITALS — BP 171/93 | HR 76 | Ht 67.0 in | Wt 340.0 lb

## 2016-06-14 DIAGNOSIS — N201 Calculus of ureter: Secondary | ICD-10-CM

## 2016-06-14 DIAGNOSIS — N1330 Unspecified hydronephrosis: Secondary | ICD-10-CM

## 2016-06-14 MED ORDER — CEPHALEXIN 500 MG PO CAPS: 500.0000 mg | ORAL_CAPSULE | Freq: Two times a day (BID) | ORAL | 0 refills | Status: DC

## 2016-06-14 MED ORDER — TAMSULOSIN HCL 0.4 MG PO CAPS: ORAL_CAPSULE | ORAL | 0 refills | Status: DC

## 2016-06-14 MED ORDER — OXYCODONE-ACETAMINOPHEN 5-325 MG PO TABS: 1.0000 | ORAL_TABLET | ORAL | 0 refills | Status: DC | PRN

## 2016-06-14 MED ORDER — DOCUSATE SODIUM 100 MG PO CAPS: ORAL_CAPSULE | ORAL | 0 refills | Status: DC

## 2016-06-14 MED ORDER — ONDANSETRON 4 MG PO TBDP: ORAL_TABLET | ORAL | 0 refills | Status: DC

## 2016-06-14 NOTE — Discharge Instructions (Signed)
You have been seen in the Emergency Department (ED) today for pain that caused by a large kidney stone on the left side.  It may pass on its own, but given its size, you may need a urological procedure to remove it.  As we have discussed, please drink plenty of fluids.  Please make a follow up appointment with the physician(s) listed elsewhere in this documentation.  You may take pain medication as needed but ONLY as prescribed.  Please also take your prescribed Flomax daily.  We also recommend that you take over-the-counter ibuprofen regularly according to label instructions over the next 5 days.  Take it with meals to minimize stomach discomfort.  Take Percocet as prescribed for severe pain. Do not drink alcohol, drive or participate in any other potentially dangerous activities while taking this medication as it may make you sleepy. Do not take this medication with any other sedating medications, either prescription or over-the-counter. If you were prescribed Percocet or Vicodin, do not take these with acetaminophen (Tylenol) as it is already contained within these medications.   This medication is an opiate (or narcotic) pain medication and can be habit forming.  Use it as little as possible to achieve adequate pain control.  Do not use or use it with extreme caution if you have a history of opiate abuse or dependence.  If you are on a pain contract with your primary care doctor or a pain specialist, be sure to let them know you were prescribed this medication today from the [facility] Emergency Department.  This medication is intended for your use only - do not give any to anyone else and keep it in a secure place where nobody else, especially children, have access to it.  It will also cause or worsen constipation, so you may want to consider taking an over-the-counter stool softener while you are taking this medication.   Please call Dr. Erlene Quan tomorrow to schedule the next available follow up  appointment.  It will likely be next week.  Please do not take ibuprofen or Aleve past Monday, because if you need a procedure next Thursday, they do not want you to take that kind of medication closer than 48 hours in advance.  Do not drink alcohol, drive or participate in any other potentially dangerous activities while taking opiate pain medication as it may make you sleepy. Do not take this medication with any other sedating medications, either prescription or over-the-counter. If you were prescribed Percocet or Vicodin, do not take these with acetaminophen (Tylenol) as it is already contained within these medications.

## 2016-06-14 NOTE — Progress Notes (Signed)
06/14/2016 5:13 PM   Sharon Holden [birth year] [id number]  Referring provider: Marguerita Merles, MD [place], [place] [address]  Chief Complaint  Patient presents with  . Nephrolithiasis    New Patient    HPI: 55 year old female who presented to the emergency room yesterday found to have a 9 mm left distal ureteral stone with moderate hydroureteronephrosis and fairly significant perinephric and periureteral stranding.  UA in the emergency room did show 6-30 white blood cells and too numerous to count white blood cells although curiously the tip showed no evidence of leukocytes or nitrites.  UCx pending.  No leukocytosis.  Her creatinine was mildly elevated to 1.14 from her baseline of 0.94.  Currently, she continues to have occasional waves of pain. She has been straining her urine. Her severe pain that she experienced prior to going to the emergency room is resolved. No fevers or chills. No dysuria, urinary frequency, urgency. She was placed on Keflex as a precaution which she continues currently.  She does have a personal history of kidney stones and has passed approximately 1. Spontaneously for the past few years. She had gastric bypass 5 years ago in her kidney stones only started since that time.  She does drink mostly Dr. Malachi Bonds in minimal water. She also enjoys adding salt to her foods.  PMH: Past Medical History:  Diagnosis Date  . Arthritis   . Hypertension   . Kidney stones     Surgical History: Past Surgical History:  Procedure Laterality Date  . CESAREAN SECTION     x 2  . GASTRIC BYPASS      Home Medications:    Medication List       Accurate as of 06/14/16  5:13 PM. Always use your most recent med list.          cephALEXin 500 MG capsule Commonly known as:  KEFLEX Take 1 capsule (500 mg total) by mouth 2 (two) times daily.   docusate sodium 100 MG capsule Commonly known as:  COLACE Take 1 tablet once or twice daily as needed  for constipation while taking narcotic pain medicine   lisinopril-hydrochlorothiazide 20-12.5 MG tablet Commonly known as:  PRINZIDE,ZESTORETIC Take 1 tablet by mouth daily.   meloxicam 7.5 MG tablet Commonly known as:  MOBIC Take 7.5 mg by mouth daily.   ondansetron 4 MG disintegrating tablet Commonly known as:  ZOFRAN ODT Allow 1-2 tablets to dissolve in your mouth every 8 hours as needed for nausea/vomiting   oxyCODONE-acetaminophen 5-325 MG tablet Commonly known as:  ROXICET Take 1-2 tablets by mouth every 4 (four) hours as needed for severe pain.   tamsulosin 0.4 MG Caps capsule Commonly known as:  FLOMAX Take 1 tablet by mouth daily until you pass the kidney stone or no longer have symptoms       Allergies: No Known Allergies  Family History: Family History  Problem Relation Age of Onset  . Bladder Cancer Neg Hx   . Prostate cancer Neg Hx   . Kidney cancer Neg Hx     Social History:  reports that she has never smoked. She has never used smokeless tobacco. She reports that she drinks alcohol. She reports that she does not use drugs.  ROS: UROLOGY Frequent Urination?: No Hard to postpone urination?: No Burning/pain with urination?: No Get up at night to urinate?: Yes Leakage of urine?: Yes Urine stream starts and stops?: No Trouble starting stream?: No Do you have to strain to  urinate?: No Blood in urine?: Yes Urinary tract infection?: Yes Sexually transmitted disease?: No Injury to kidneys or bladder?: No Painful intercourse?: No Weak stream?: No Currently pregnant?: No Vaginal bleeding?: No Last menstrual period?: n  Gastrointestinal Nausea?: No Vomiting?: No Indigestion/heartburn?: No Diarrhea?: No Constipation?: No  Constitutional Fever: No Night sweats?: No Weight loss?: No Fatigue?: No  Skin Skin rash/lesions?: No Itching?: No  Eyes Blurred vision?: No Double vision?: No  Ears/Nose/Throat Sore throat?: No Sinus problems?:  No  Hematologic/Lymphatic Swollen glands?: No Easy bruising?: No  Cardiovascular Leg swelling?: No Chest pain?: No  Respiratory Cough?: No Shortness of breath?: No  Endocrine Excessive thirst?: No  Musculoskeletal Back pain?: No Joint pain?: No  Neurological Headaches?: No Dizziness?: No  Psychologic Depression?: No Anxiety?: No  Physical Exam: BP (!) 171/93   Pulse 76   Ht 5\' 7"  (1.702 m)   Wt (!) 340 lb (154.2 kg)   LMP  (LMP Unknown) Comment: neg preg test  BMI 53.25 kg/m   Constitutional:  Alert and oriented, No acute distress. HEENT: [place] AT, moist mucus membranes.  Trachea midline, no masses. Cardiovascular: No clubbing, cyanosis, or edema. RRR. Respiratory: Normal respiratory effort, no increased work of breathing. CTAB. GI: Abdomen is soft, nontender, nondistended, no abdominal masses.  Obese.   GU: No CVA tenderness.  Skin: No rashes, bruises or suspicious lesions. Neurologic: Grossly intact, no focal deficits, moving all 4 extremities. Psychiatric: Normal mood and affect.  Laboratory Data: Lab Results  Component Value Date   WBC 10.1 06/13/2016   HGB 13.4 06/13/2016   HCT 38.8 06/13/2016   MCV 87.8 06/13/2016   PLT 170 06/13/2016    Lab Results  Component Value Date   CREATININE 1.14 (H) 06/13/2016   Urinalysis Component     Latest Ref Rng & Units 06/13/2016  pH     5.0 - 8.0 8.0  Protein     NEGATIVE mg/dL NEGATIVE  Nitrite     NEGATIVE NEGATIVE  Color, Urine     YELLOW STRAW (A)  Appearance     CLEAR CLEAR (A)  Glucose     NEGATIVE mg/dL NEGATIVE  Bilirubin Urine     NEGATIVE NEGATIVE  Ketones, ur     NEGATIVE mg/dL NEGATIVE  Specific Gravity, Urine     1.005 - 1.030 1.012  Hgb urine dipstick     NEGATIVE 2+ (A)  Leukocytes, UA     NEGATIVE NEGATIVE  RBC / HPF     0 - 5 RBC/hpf TOO NUMEROUS TO COUNT  WBC, UA     0 - 5 WBC/hpf 6-30  Bacteria, UA     NONE SEEN NONE SEEN  Squamous Epithelial / LPF     NONE SEEN 0-5  (A)     UCx pending-   Pertinent Imaging: CLINICAL DATA:  Left flank pain since yesterday.  EXAM: CT ABDOMEN AND PELVIS WITHOUT CONTRAST  TECHNIQUE: Multidetector CT imaging of the abdomen and pelvis was performed following the standard protocol without IV contrast.  COMPARISON:  None.  FINDINGS: Lower chest: Minimal dependent atelectasis.  Hepatobiliary: The liver is enlarged measuring 20 cm cranial caudal. There is diffusely decreased density consistent with steatosis. Layering density in the gallbladder may be combination of small stones and sludge. No pericholecystic inflammation. No biliary dilatation.  Pancreas: No ductal dilatation or inflammation.  Spleen: Upper normal in size. No focal abnormality. Splenule noted at the hilum.  Adrenals/Urinary Tract: A irregularly-shaped 9 x 8 mm stone in the distal  left ureter just proximal to the ureterovesicular junction with moderate hydroureteronephrosis. Moderate perinephric and periureteric edema. No additional nonobstructing stones in either kidney. The bladder is physiologically distended, no bladder stone. Normal adrenal glands.  Stomach/Bowel: Post gastric bypass with minimal fluid in the excluded gastric remnant. No small bowel dilatation or obstruction. Multifocal colonic diverticulosis without diverticulitis, most prominent in the distal colon, with minimal hepatic flexure involvement. Normal appendix.  Vascular/Lymphatic: No significant vascular findings are present. No enlarged abdominal or pelvic lymph nodes.  Reproductive: Uterus and bilateral adnexa are unremarkable.  Other: Small fat containing umbilical hernia. No intra-abdominal pelvic ascites.  Musculoskeletal: Multilevel degenerative change throughout spine. Moderate degenerative change in the hips, right greater than left. There are no acute or suspicious osseous abnormalities.  IMPRESSION: 1. Obstructing 9 x 8 mm stone in the  distal left ureter with moderate proximal hydroureteronephrosis, perinephric and periureteric edema. The degree of surrounding soft tissue inflammation can be seen in the setting of concurrent urinary tract infection. 2. Hepatic steatosis and hepatomegaly. Probable sludge and small stones in the gallbladder. 3. Colonic diverticulosis without diverticulitis.   Electronically Signed   By: Jeb Levering M.D.   On: 06/13/2016 23:01  CT scan personally reviewed today and with the patient.  Assessment & Plan:  55 year old female with a 9 mm left UVJ stone with significant perinephric stranding and moderate hydroureteronephrosis. She does have a mildly positive UA although this appears to be likely a contaminant, urine culture pending. No other signs or symptoms of infection.  1. Left ureteral calculus We discussed various treatment options including ESWL vs. ureteroscopy, laser lithotripsy, and stent  Vs. Continued medical expulsive therapy. We discussed the risks and benefits of both including bleeding, infection, damage to surrounding structures, efficacy with need for possible further intervention, and need for temporary ureteral stent.  Given the size of the stone, I would recommend proceeding with intervention as it is unlikely to pass spontaneously. Given her habitus, I feel that she is a better candidate for ureteroscopy. She is agreeable with this plan.  Warning symptoms including fever, chills, malaise, or any other abnormality including severe uncontrolled pain were discussed in detail. We discussed there should be a very low threshold to present to the emergency room and she feels like something is wrong.  We'll plan to get her scheduled next week for ureteroscopy with Dr. Pilar Jarvis on Thursday. All her questions were answered today.  2. Hydronephrosis, left Secondary to #1  Hollice Espy, MD  [facility] [address], [place], [place]  [address] [phone]

## 2016-06-14 NOTE — ED Notes (Signed)
Pt discharged to home.  Family member driving.  Discharge instructions reviewed.  Verbalized understanding.  No questions or concerns at this time.  Teach back verified.  Pt in NAD.  No items left in ED.   

## 2016-06-15 LAB — URINE CULTURE: Special Requests: NORMAL

## 2016-06-17 ENCOUNTER — Other Ambulatory Visit: Payer: Self-pay | Admitting: Radiology

## 2016-06-17 ENCOUNTER — Telehealth: Payer: Self-pay | Admitting: Radiology

## 2016-06-17 DIAGNOSIS — N201 Calculus of ureter: Secondary | ICD-10-CM

## 2016-06-17 NOTE — Telephone Encounter (Signed)
-----   Message from Hollice Espy, MD sent at 06/14/2016  3:46 PM EDT ----- Regarding: RE: close follow up for big stone Ancef 3 g  Left ureteroscopy, laser lithotripsy, ureteral stent placement  Hollice Espy, MD  ----- Message ----- From: Ranell Patrick, RN Sent: 06/14/2016   3:26 PM To: Hollice Espy, MD Subject: RE: close follow up for big stone              Can you give me orders for this pt & let me know what the consent needs to say so I can get this arranged for Thursday with Bud[place]. Leah gave [place] the ok.  ----- Message ----- From: Hollice Espy, MD Sent: 06/14/2016   8:25 AM To: Ranell Patrick, RN, Gerhard Perches Subject: FW: close follow up for big stone              Please work this patient in ASAP (ideally Monday, overbook as needed).  She likely needs to be set up for surgery, possibly ESWL.  Hollice Espy, MD  ----- Message ----- From: Hinda Kehr, MD Sent: 06/14/2016  12:14 AM To: Hollice Espy, MD Subject: close follow up for big stone                  Hi Caryl Pina,  This woman was seen in the ED on the evening of 10/12.  She has a 9x8 mm stone in the left distal ureter with a lot of edema and inflammation.  No obvious UTI on the UA, normal vitals, no leukocytosis.  Radiology scared me a bit with their interpretation, but her patient was well controlled ,and I spoke briefly with Dr. Pilar Jarvis who agreed that outpatient follow up would be fine under the circumstances.  I gave her the usual return precautions and told her it would likely be next week before you could see her.  If you wouldn't mind follow up on her, I would appreciate it.  Thanks!   --  Tommi Rumps [facility] ED)

## 2016-06-17 NOTE — Telephone Encounter (Signed)
Notified pt of surgery schedule with Dr Pilar Jarvis on 06/20/16, pre-admit testing appt on 06/18/16 @2 :00 & to call day prior to surgery for arrival time to SDS. Pt voices understanding.

## 2016-06-18 ENCOUNTER — Inpatient Hospital Stay: Admission: RE | Admit: 2016-06-18 | Payer: BLUE CROSS/BLUE SHIELD | Source: Ambulatory Visit

## 2016-06-18 NOTE — Telephone Encounter (Signed)
Pt missed pre-admit testing appt on 06/18/16. Notified pt that appt has been rescheduled to 06/19/16 @11 :30. Pt voices understanding.

## 2016-06-19 ENCOUNTER — Encounter
Admission: RE | Admit: 2016-06-19 | Discharge: 2016-06-19 | Disposition: A | Discharge status: Home / Self Care | Payer: BLUE CROSS/BLUE SHIELD | Source: Ambulatory Visit | Attending: Urology | Admitting: Urology

## 2016-06-19 DIAGNOSIS — Z87442 Personal history of urinary calculi: Secondary | ICD-10-CM | POA: Diagnosis not present

## 2016-06-19 DIAGNOSIS — G4730 Sleep apnea, unspecified: Secondary | ICD-10-CM | POA: Diagnosis not present

## 2016-06-19 DIAGNOSIS — K760 Fatty (change of) liver, not elsewhere classified: Secondary | ICD-10-CM | POA: Diagnosis not present

## 2016-06-19 DIAGNOSIS — K5730 Diverticulosis of large intestine without perforation or abscess without bleeding: Secondary | ICD-10-CM | POA: Diagnosis not present

## 2016-06-19 DIAGNOSIS — N132 Hydronephrosis with renal and ureteral calculous obstruction: Secondary | ICD-10-CM | POA: Diagnosis not present

## 2016-06-19 DIAGNOSIS — Z6841 Body Mass Index (BMI) 40.0 and over, adult: Secondary | ICD-10-CM | POA: Diagnosis not present

## 2016-06-19 DIAGNOSIS — I10 Essential (primary) hypertension: Secondary | ICD-10-CM | POA: Diagnosis not present

## 2016-06-19 DIAGNOSIS — M1990 Unspecified osteoarthritis, unspecified site: Secondary | ICD-10-CM | POA: Diagnosis not present

## 2016-06-19 DIAGNOSIS — Z9884 Bariatric surgery status: Secondary | ICD-10-CM | POA: Diagnosis not present

## 2016-06-19 DIAGNOSIS — R160 Hepatomegaly, not elsewhere classified: Secondary | ICD-10-CM | POA: Diagnosis not present

## 2016-06-19 HISTORY — DX: Personal history of urinary calculi: Z87.442

## 2016-06-19 HISTORY — DX: Sleep apnea, unspecified: G47.30

## 2016-06-19 NOTE — Patient Instructions (Signed)
  Your procedure is scheduled on: 06/20/16 Report to Day Surgery. [facility] Eyvonne Mechanic find out your arrival time please call [phone] between 1PM - 3PM on 06/19/16 Remember: Instructions that are not followed completely may result in serious medical risk, up to and including death, or upon the discretion of your surgeon and anesthesiologist your surgery may need to be rescheduled.    __X__ 1. Do not eat food or drink liquids after midnight. No gum chewing or hard candies.     __X__ 2. No Alcohol for 24 hours before or after surgery.   __X__ 3. Do Not Smoke For 24 Hours Prior to Your Surgery.   ____ 4. Bring all medications with you on the day of surgery if instructed.    __X__ 5. Notify your doctor if there is any change in your medical condition     (cold, fever, infections).       Do not wear jewelry, make-up, hairpins, clips or nail polish.  Do not wear lotions, powders, or perfumes. You may wear deodorant.  Do not shave 48 hours prior to surgery. Men may shave face and neck.  Do not bring valuables to the hospital.    [facility] is not responsible for any belongings or valuables.               Contacts, dentures or bridgework may not be worn into surgery.  Leave your suitcase in the car. After surgery it may be brought to your room.  For patients admitted to the hospital, discharge time is determined by your                treatment team.   Patients discharged the day of surgery will not be allowed to drive home.   ____ Take these medicines the morning of surgery with A SIP OF WATER:    1.   2.   3.   4.  5.  6.  ____ Fleet Enema (as directed)   ____ Use CHG Soap as directed  ____ Use inhalers on the day of surgery  ____ Stop metformin 2 days prior to surgery    ____ Take 1/2 of usual insulin dose the night before surgery and none on the morning of surgery.   ____ Stop Coumadin/Plavix/aspirin on   ____ Stop Anti-inflammatories on    ____ Stop  supplements until after surgery.    ____ Bring C-Pap to the hospital.

## 2016-06-20 ENCOUNTER — Ambulatory Visit: Payer: BLUE CROSS/BLUE SHIELD | Admitting: Anesthesiology

## 2016-06-20 ENCOUNTER — Encounter: Payer: Self-pay | Admitting: *Deleted

## 2016-06-20 ENCOUNTER — Encounter
Admission: RE | Disposition: A | Discharge status: Home / Self Care | Payer: Self-pay | Source: Ambulatory Visit | Attending: Urology

## 2016-06-20 ENCOUNTER — Ambulatory Visit
Admission: RE | Admit: 2016-06-20 | Discharge: 2016-06-20 | Disposition: A | Discharge status: Home / Self Care | Payer: BLUE CROSS/BLUE SHIELD | Source: Ambulatory Visit | Attending: Urology | Admitting: Urology

## 2016-06-20 ENCOUNTER — Telehealth: Payer: Self-pay

## 2016-06-20 DIAGNOSIS — M1990 Unspecified osteoarthritis, unspecified site: Secondary | ICD-10-CM | POA: Insufficient documentation

## 2016-06-20 DIAGNOSIS — G4730 Sleep apnea, unspecified: Secondary | ICD-10-CM | POA: Insufficient documentation

## 2016-06-20 DIAGNOSIS — N132 Hydronephrosis with renal and ureteral calculous obstruction: Secondary | ICD-10-CM | POA: Diagnosis not present

## 2016-06-20 DIAGNOSIS — N201 Calculus of ureter: Secondary | ICD-10-CM | POA: Diagnosis not present

## 2016-06-20 DIAGNOSIS — K760 Fatty (change of) liver, not elsewhere classified: Secondary | ICD-10-CM | POA: Insufficient documentation

## 2016-06-20 DIAGNOSIS — I10 Essential (primary) hypertension: Secondary | ICD-10-CM | POA: Insufficient documentation

## 2016-06-20 DIAGNOSIS — K5730 Diverticulosis of large intestine without perforation or abscess without bleeding: Secondary | ICD-10-CM | POA: Insufficient documentation

## 2016-06-20 DIAGNOSIS — R160 Hepatomegaly, not elsewhere classified: Secondary | ICD-10-CM | POA: Insufficient documentation

## 2016-06-20 DIAGNOSIS — Z87442 Personal history of urinary calculi: Secondary | ICD-10-CM | POA: Insufficient documentation

## 2016-06-20 DIAGNOSIS — Z6841 Body Mass Index (BMI) 40.0 and over, adult: Secondary | ICD-10-CM | POA: Insufficient documentation

## 2016-06-20 DIAGNOSIS — Z9884 Bariatric surgery status: Secondary | ICD-10-CM | POA: Insufficient documentation

## 2016-06-20 HISTORY — PX: CYSTOSCOPY WITH STENT PLACEMENT: SHX5790

## 2016-06-20 HISTORY — PX: URETEROSCOPY WITH HOLMIUM LASER LITHOTRIPSY: SHX6645

## 2016-06-20 SURGERY — URETEROSCOPY, WITH LITHOTRIPSY USING HOLMIUM LASER
Anesthesia: General | Laterality: Left

## 2016-06-20 MED ORDER — DEXTROSE 5 % IV SOLN
3.0000 g | INTRAVENOUS | Status: DC
  Filled 2016-06-20 (×2): qty 3000

## 2016-06-20 MED ORDER — SUCCINYLCHOLINE CHLORIDE 20 MG/ML IJ SOLN
INTRAMUSCULAR | Status: DC | PRN
  Administered 2016-06-20: 100 mg via INTRAVENOUS

## 2016-06-20 MED ORDER — DEXAMETHASONE SODIUM PHOSPHATE 10 MG/ML IJ SOLN
INTRAMUSCULAR | Status: DC | PRN
  Administered 2016-06-20: 5 mg via INTRAVENOUS

## 2016-06-20 MED ORDER — CEPHALEXIN 500 MG PO CAPS: 500.0000 mg | ORAL_CAPSULE | Freq: Three times a day (TID) | ORAL | 0 refills | Status: DC

## 2016-06-20 MED ORDER — DEXTROSE 5 % IV SOLN
INTRAVENOUS | Status: DC | PRN
  Administered 2016-06-20: 3 g via INTRAVENOUS

## 2016-06-20 MED ORDER — FAMOTIDINE 20 MG PO TABS
20.0000 mg | ORAL_TABLET | Freq: Once | ORAL | Status: AC
  Administered 2016-06-20: 20 mg via ORAL

## 2016-06-20 MED ORDER — LACTATED RINGERS IV SOLN
INTRAVENOUS | Status: DC
  Administered 2016-06-20: 14:00:00 via INTRAVENOUS

## 2016-06-20 MED ORDER — SODIUM CHLORIDE 0.9 % IV SOLN
INTRAVENOUS | Status: DC | PRN
  Administered 2016-06-20: 10 mL

## 2016-06-20 MED ORDER — LIDOCAINE HCL (CARDIAC) 20 MG/ML IV SOLN
INTRAVENOUS | Status: DC | PRN
  Administered 2016-06-20: 60 mg via INTRAVENOUS

## 2016-06-20 MED ORDER — FENTANYL CITRATE (PF) 100 MCG/2ML IJ SOLN
25.0000 ug | INTRAMUSCULAR | Status: DC | PRN
  Administered 2016-06-20: 25 ug via INTRAVENOUS

## 2016-06-20 MED ORDER — FENTANYL CITRATE (PF) 100 MCG/2ML IJ SOLN
INTRAMUSCULAR | Status: DC | PRN
  Administered 2016-06-20: 100 ug via INTRAVENOUS

## 2016-06-20 MED ORDER — SODIUM CHLORIDE 0.9 % IV SOLN
INTRAVENOUS | Status: DC
  Filled 2016-06-20: qty 50

## 2016-06-20 MED ORDER — ONDANSETRON HCL 4 MG/2ML IJ SOLN: 4.0000 mg | Freq: Once | INTRAMUSCULAR | Status: DC | PRN

## 2016-06-20 MED ORDER — PHENYLEPHRINE HCL 10 MG/ML IJ SOLN
INTRAMUSCULAR | Status: DC | PRN
  Administered 2016-06-20 (×2): 100 ug via INTRAVENOUS

## 2016-06-20 MED ORDER — FAMOTIDINE 20 MG PO TABS
ORAL_TABLET | ORAL | Status: AC
  Filled 2016-06-20: qty 1

## 2016-06-20 MED ORDER — FENTANYL CITRATE (PF) 100 MCG/2ML IJ SOLN
INTRAMUSCULAR | Status: AC
  Administered 2016-06-20: 25 ug via INTRAVENOUS
  Filled 2016-06-20: qty 2

## 2016-06-20 MED ORDER — OXYCODONE-ACETAMINOPHEN 5-325 MG PO TABS: 1.0000 | ORAL_TABLET | ORAL | 0 refills | Status: DC | PRN

## 2016-06-20 MED ORDER — MIDAZOLAM HCL 2 MG/2ML IJ SOLN
INTRAMUSCULAR | Status: DC | PRN
Start: 2016-06-20 — End: 2016-06-20
  Administered 2016-06-20: 2 mg via INTRAVENOUS

## 2016-06-20 MED ORDER — PROPOFOL 10 MG/ML IV BOLUS
INTRAVENOUS | Status: DC | PRN
  Administered 2016-06-20: 50 mg via INTRAVENOUS
  Administered 2016-06-20: 150 mg via INTRAVENOUS
  Administered 2016-06-20: 50 mg via INTRAVENOUS

## 2016-06-20 MED ORDER — ONDANSETRON HCL 4 MG/2ML IJ SOLN
INTRAMUSCULAR | Status: DC | PRN
  Administered 2016-06-20: 4 mg via INTRAVENOUS

## 2016-06-20 SURGICAL SUPPLY — 29 items
BACTOSHIELD CHG 4% 4OZ (MISCELLANEOUS) ×1
BASKET ZERO TIP 1.9FR (BASKET) ×2 IMPLANT
CATH URETL 5X70 OPEN END (CATHETERS) ×2 IMPLANT
CNTNR SPEC 2.5X3XGRAD LEK (MISCELLANEOUS) ×1
CONT SPEC 4OZ STER OR WHT (MISCELLANEOUS) ×1
CONTAINER SPEC 2.5X3XGRAD LEK (MISCELLANEOUS) ×1 IMPLANT
FEE TECHNICIAN ONLY PER HOUR (MISCELLANEOUS) IMPLANT
FIBER LASER LITHO 273 (Laser) ×2 IMPLANT
GLOVE BIO SURGEON STRL SZ7 (GLOVE) ×2 IMPLANT
GLOVE BIO SURGEON STRL SZ7.5 (GLOVE) ×2 IMPLANT
GOWN STRL REUS W/ TWL LRG LVL4 (GOWN DISPOSABLE) ×1 IMPLANT
GOWN STRL REUS W/TWL LRG LVL4 (GOWN DISPOSABLE) ×1
GOWN STRL REUS W/TWL XL LVL3 (GOWN DISPOSABLE) ×2 IMPLANT
GUIDEWIRE SUPER STIFF (WIRE) IMPLANT
INTRODUCER DILATOR DOUBLE (INTRODUCER) IMPLANT
KIT RM TURNOVER CYSTO AR (KITS) ×2 IMPLANT
PACK CYSTO AR (MISCELLANEOUS) ×2 IMPLANT
SCRUB CHG 4% DYNA-HEX 4OZ (MISCELLANEOUS) ×1 IMPLANT
SENSORWIRE 0.038 NOT ANGLED (WIRE)
SET CYSTO W/LG BORE CLAMP LF (SET/KITS/TRAYS/PACK) ×2 IMPLANT
SHEATH URETERAL 13/15X36 1L (SHEATH) IMPLANT
SOL .9 NS 3000ML IRR  AL (IV SOLUTION) ×1
SOL .9 NS 3000ML IRR UROMATIC (IV SOLUTION) ×1 IMPLANT
STENT URET 6FRX24 CONTOUR (STENTS) IMPLANT
STENT URET 6FRX26 CONTOUR (STENTS) ×2 IMPLANT
SURGILUBE 2OZ TUBE FLIPTOP (MISCELLANEOUS) ×2 IMPLANT
SYRINGE IRR TOOMEY STRL 70CC (SYRINGE) ×2 IMPLANT
WATER STERILE IRR 1000ML POUR (IV SOLUTION) ×2 IMPLANT
WIRE SENSOR 0.038 NOT ANGLED (WIRE) IMPLANT

## 2016-06-20 NOTE — Interval H&P Note (Signed)
History and Physical Interval Note:  06/20/2016 2:11 PM  Sharon Holden  has presented today for surgery, with the diagnosis of left ureteral calculus  The various methods of treatment have been discussed with the patient and family. After consideration of risks, benefits and other options for treatment, the patient has consented to  Procedure(s): URETEROSCOPY WITH HOLMIUM LASER LITHOTRIPSY (Left) CYSTOSCOPY WITH STENT PLACEMENT (Left) as a surgical intervention .  The patient's history has been reviewed, patient examined, no change in status, stable for surgery.  I have reviewed the patient's chart and labs.  Questions were answered to the patient's satisfaction.    RRR Lungs clear  Nickie Retort

## 2016-06-20 NOTE — Telephone Encounter (Signed)
-----   Message from Nickie Retort, MD sent at 06/20/2016  3:17 PM EDT ----- Patient needs cysto/stent removal in one week. Ok to schedule with another provider since I am not here Thursday or Friday next week.

## 2016-06-20 NOTE — Anesthesia Preprocedure Evaluation (Addendum)
Anesthesia Evaluation  Patient identified by MRN, date of birth, ID band Patient awake    Reviewed: Allergy & Precautions, NPO status , Patient's Chart, lab work & pertinent test results, reviewed documented beta blocker date and time   Airway Mallampati: III  TM Distance: >3 FB     Dental  (+) Chipped   Pulmonary sleep apnea ,           Cardiovascular hypertension, Pt. on medications      Neuro/Psych    GI/Hepatic   Endo/Other  Morbid obesity  Renal/GU Renal disease     Musculoskeletal  (+) Arthritis ,   Abdominal   Peds  Hematology   Anesthesia Other Findings Gastric bypass. EKG ok.  Reproductive/Obstetrics                            Anesthesia Physical Anesthesia Plan  ASA: III  Anesthesia Plan: General   Post-op Pain Management:    Induction: Intravenous  Airway Management Planned: Oral ETT  Additional Equipment:   Intra-op Plan:   Post-operative Plan:   Informed Consent: I have reviewed the patients History and Physical, chart, labs and discussed the procedure including the risks, benefits and alternatives for the proposed anesthesia with the patient or authorized representative who has indicated his/her understanding and acceptance.     Plan Discussed with: CRNA  Anesthesia Plan Comments:         Anesthesia Quick Evaluation

## 2016-06-20 NOTE — Transfer of Care (Signed)
Immediate Anesthesia Transfer of Care Note  Patient: Sharon Holden  Procedure(s) Performed: Procedure(s): URETEROSCOPY WITH HOLMIUM LASER LITHOTRIPSY (Left) CYSTOSCOPY WITH STENT PLACEMENT (Left)  Patient Location: PACU  Anesthesia Type:General  Level of Consciousness: awake, alert  and oriented  Airway & Oxygen Therapy: Patient Spontanous Breathing and Patient connected to face mask oxygen  Post-op Assessment: Report given to RN and Post -op Vital signs reviewed and stable  Post vital signs: Reviewed and stable  Last Vitals:  Vitals:   06/20/16 1340 06/20/16 1520  BP: (!) 167/87 (!) 154/90  Pulse: 84 88  Resp: 16 19  Temp: 36.8 C 36.3 C    Last Pain:  Vitals:   06/20/16 1340  TempSrc: Oral  PainSc: 0-No pain         Complications: No apparent anesthesia complications

## 2016-06-20 NOTE — Op Note (Signed)
Date of procedure: 06/20/16  Preoperative diagnosis:  1. Left ureteral stone   Postoperative diagnosis:  1. Left ureteral stone   Procedure: 1. Cystoscopy 2. Left ureteroscopy 3. Laser lithotripsy 4. Stone basketing 5. Left ureteral stent placement 6 French by 26 cm  Surgeon: Baruch Gouty, MD  Anesthesia: General  Complications: None  Intraoperative findings: The patient a 9 mm stone at her UVJ on the left side. This was broken into smaller fragments laser lithotripsy removed in its entirety. Left retrograde pyelogram the procedure so no further filling defects and a dilated collecting system from the afferent mentioned stone.  EBL: None  Specimens: Left ureteral stone  Drains: 6 French by 26 mL left ureteral stent  Disposition: Stable to the postanesthesia care unit  Indication for procedure: The patient is a 55 y.o. female with a 9 mm calculus in the left ureter presents today for definitive surgical management.  After reviewing the management options for treatment, the patient elected to proceed with the above surgical procedure(s). We have discussed the potential benefits and risks of the procedure, side effects of the proposed treatment, the likelihood of the patient achieving the goals of the procedure, and any potential problems that might occur during the procedure or recuperation. Informed consent has been obtained.  Description of procedure: The patient was met in the preoperative area. All risks, benefits, and indications of the procedure were described in great detail. The patient consented to the procedure. Preoperative antibiotics were given. The patient was taken to the operative theater. General anesthesia was induced per the anesthesia service. The patient was then placed in the dorsal lithotomy position and prepped and draped in the usual sterile fashion. A preoperative timeout was called.   A 21 French 30 cystoscope was inserted to the patient's bladder per  urethra atraumatically. The 9 mm stone was seen abutting at the left UVJ. Due to the large size of the stone, I did not want to basket the stone from with in the bladder.  A sensor wire was advanced to level of the renal pelvis alongside and fluoroscopy. The semirigid ureteroscope was inserted into the patient's bladder and into the left UVJ pushing the stone more proximal. At this point the stone was broken into smaller fragments laser lithotripsy. These fragments removed with a stone basket. Pan ureteroscopy this point showed no further stone fragments or debris. A retrograde polygrams obtained through the left ureteroscope which showed hydroureteronephrosis down to the level where the stone was removed. Through scope was withdrawn. Cisco with some over the sensor wire 6 French by 26 over double-J ureter stent was placed. The sensor wire was removed. A curl seen in the patient's renal pelvis with fluoroscopy and under direct visualization of the urinary bladder. Patient's bladder was drained. All stone fragments removed. She was woken from anesthesia and transferred in stable condition to the post anesthesia care unit.  Plan: The patient will follow-up in one week for left ureteral stent removal. She'll follow-up month after that to rule out iatrogenic hydronephrosis with a renal ultrasound. We will discuss her stone analysis when available.  Baruch Gouty, M.D.

## 2016-06-20 NOTE — Anesthesia Procedure Notes (Signed)
Procedure Name: Intubation Date/Time: 06/20/2016 2:39 PM Performed by: Dionne Bucy Pre-anesthesia Checklist: Patient identified, Patient being monitored, Timeout performed, Emergency Drugs available and Suction available Patient Re-evaluated:Patient Re-evaluated prior to inductionOxygen Delivery Method: Circle system utilized Preoxygenation: Pre-oxygenation with 100% oxygen Intubation Type: IV induction Ventilation: Mask ventilation without difficulty Laryngoscope Size: Mac and 3 Grade View: Grade I Tube type: Oral Tube size: 7.0 mm Number of attempts: 1 Airway Equipment and Method: Stylet Placement Confirmation: ETT inserted through vocal cords under direct vision,  positive ETCO2 and breath sounds checked- equal and bilateral Secured at: 21 cm Tube secured with: Tape Dental Injury: Teeth and Oropharynx as per pre-operative assessment

## 2016-06-20 NOTE — H&P (View-Only) (Signed)
06/14/2016 5:13 PM   Sharon Holden 1960/12/21 [id number]  Referring provider: Marguerita Merles, MD [place], [place] [address]  Chief Complaint  Patient presents with  . Nephrolithiasis    New Patient    HPI: 55 year old female who presented to the emergency room yesterday found to have a 9 mm left distal ureteral stone with moderate hydroureteronephrosis and fairly significant perinephric and periureteral stranding.  UA in the emergency room did show 6-30 white blood cells and too numerous to count white blood cells although curiously the tip showed no evidence of leukocytes or nitrites.  UCx pending.  No leukocytosis.  Her creatinine was mildly elevated to 1.14 from her baseline of 0.94.  Currently, she continues to have occasional waves of pain. She has been straining her urine. Her severe pain that she experienced prior to going to the emergency room is resolved. No fevers or chills. No dysuria, urinary frequency, urgency. She was placed on Keflex as a precaution which she continues currently.  She does have a personal history of kidney stones and has passed approximately 1. Spontaneously for the past few years. She had gastric bypass 5 years ago in her kidney stones only started since that time.  She does drink mostly Dr. Malachi Bonds in minimal water. She also enjoys adding salt to her foods.  PMH: Past Medical History:  Diagnosis Date  . Arthritis   . Hypertension   . Kidney stones     Surgical History: Past Surgical History:  Procedure Laterality Date  . CESAREAN SECTION     x 2  . GASTRIC BYPASS      Home Medications:    Medication List       Accurate as of 06/14/16  5:13 PM. Always use your most recent med list.          cephALEXin 500 MG capsule Commonly known as:  KEFLEX Take 1 capsule (500 mg total) by mouth 2 (two) times daily.   docusate sodium 100 MG capsule Commonly known as:  COLACE Take 1 tablet once or twice daily as needed  for constipation while taking narcotic pain medicine   lisinopril-hydrochlorothiazide 20-12.5 MG tablet Commonly known as:  PRINZIDE,ZESTORETIC Take 1 tablet by mouth daily.   meloxicam 7.5 MG tablet Commonly known as:  MOBIC Take 7.5 mg by mouth daily.   ondansetron 4 MG disintegrating tablet Commonly known as:  ZOFRAN ODT Allow 1-2 tablets to dissolve in your mouth every 8 hours as needed for nausea/vomiting   oxyCODONE-acetaminophen 5-325 MG tablet Commonly known as:  ROXICET Take 1-2 tablets by mouth every 4 (four) hours as needed for severe pain.   tamsulosin 0.4 MG Caps capsule Commonly known as:  FLOMAX Take 1 tablet by mouth daily until you pass the kidney stone or no longer have symptoms       Allergies: No Known Allergies  Family History: Family History  Problem Relation Age of Onset  . Bladder Cancer Neg Hx   . Prostate cancer Neg Hx   . Kidney cancer Neg Hx     Social History:  reports that she has never smoked. She has never used smokeless tobacco. She reports that she drinks alcohol. She reports that she does not use drugs.  ROS: UROLOGY Frequent Urination?: No Hard to postpone urination?: No Burning/pain with urination?: No Get up at night to urinate?: Yes Leakage of urine?: Yes Urine stream starts and stops?: No Trouble starting stream?: No Do you have to strain to  urinate?: No Blood in urine?: Yes Urinary tract infection?: Yes Sexually transmitted disease?: No Injury to kidneys or bladder?: No Painful intercourse?: No Weak stream?: No Currently pregnant?: No Vaginal bleeding?: No Last menstrual period?: n  Gastrointestinal Nausea?: No Vomiting?: No Indigestion/heartburn?: No Diarrhea?: No Constipation?: No  Constitutional Fever: No Night sweats?: No Weight loss?: No Fatigue?: No  Skin Skin rash/lesions?: No Itching?: No  Eyes Blurred vision?: No Double vision?: No  Ears/Nose/Throat Sore throat?: No Sinus problems?:  No  Hematologic/Lymphatic Swollen glands?: No Easy bruising?: No  Cardiovascular Leg swelling?: No Chest pain?: No  Respiratory Cough?: No Shortness of breath?: No  Endocrine Excessive thirst?: No  Musculoskeletal Back pain?: No Joint pain?: No  Neurological Headaches?: No Dizziness?: No  Psychologic Depression?: No Anxiety?: No  Physical Exam: BP (!) 171/93   Pulse 76   Ht 5\' 7"  (1.702 m)   Wt (!) 340 lb (154.2 kg)   LMP  (LMP Unknown) Comment: neg preg test  BMI 53.25 kg/m   Constitutional:  Alert and oriented, No acute distress. HEENT: [place] AT, moist mucus membranes.  Trachea midline, no masses. Cardiovascular: No clubbing, cyanosis, or edema. RRR. Respiratory: Normal respiratory effort, no increased work of breathing. CTAB. GI: Abdomen is soft, nontender, nondistended, no abdominal masses.  Obese.   GU: No CVA tenderness.  Skin: No rashes, bruises or suspicious lesions. Neurologic: Grossly intact, no focal deficits, moving all 4 extremities. Psychiatric: Normal mood and affect.  Laboratory Data: Lab Results  Component Value Date   WBC 10.1 06/13/2016   HGB 13.4 06/13/2016   HCT 38.8 06/13/2016   MCV 87.8 06/13/2016   PLT 170 06/13/2016    Lab Results  Component Value Date   CREATININE 1.14 (H) 06/13/2016   Urinalysis Component     Latest Ref Rng & Units 06/13/2016  pH     5.0 - 8.0 8.0  Protein     NEGATIVE mg/dL NEGATIVE  Nitrite     NEGATIVE NEGATIVE  Color, Urine     YELLOW STRAW (A)  Appearance     CLEAR CLEAR (A)  Glucose     NEGATIVE mg/dL NEGATIVE  Bilirubin Urine     NEGATIVE NEGATIVE  Ketones, ur     NEGATIVE mg/dL NEGATIVE  Specific Gravity, Urine     1.005 - 1.030 1.012  Hgb urine dipstick     NEGATIVE 2+ (A)  Leukocytes, UA     NEGATIVE NEGATIVE  RBC / HPF     0 - 5 RBC/hpf TOO NUMEROUS TO COUNT  WBC, UA     0 - 5 WBC/hpf 6-30  Bacteria, UA     NONE SEEN NONE SEEN  Squamous Epithelial / LPF     NONE SEEN 0-5  (A)     UCx pending-   Pertinent Imaging: CLINICAL DATA:  Left flank pain since yesterday.  EXAM: CT ABDOMEN AND PELVIS WITHOUT CONTRAST  TECHNIQUE: Multidetector CT imaging of the abdomen and pelvis was performed following the standard protocol without IV contrast.  COMPARISON:  None.  FINDINGS: Lower chest: Minimal dependent atelectasis.  Hepatobiliary: The liver is enlarged measuring 20 cm cranial caudal. There is diffusely decreased density consistent with steatosis. Layering density in the gallbladder may be combination of small stones and sludge. No pericholecystic inflammation. No biliary dilatation.  Pancreas: No ductal dilatation or inflammation.  Spleen: Upper normal in size. No focal abnormality. Splenule noted at the hilum.  Adrenals/Urinary Tract: A irregularly-shaped 9 x 8 mm stone in the distal  left ureter just proximal to the ureterovesicular junction with moderate hydroureteronephrosis. Moderate perinephric and periureteric edema. No additional nonobstructing stones in either kidney. The bladder is physiologically distended, no bladder stone. Normal adrenal glands.  Stomach/Bowel: Post gastric bypass with minimal fluid in the excluded gastric remnant. No small bowel dilatation or obstruction. Multifocal colonic diverticulosis without diverticulitis, most prominent in the distal colon, with minimal hepatic flexure involvement. Normal appendix.  Vascular/Lymphatic: No significant vascular findings are present. No enlarged abdominal or pelvic lymph nodes.  Reproductive: Uterus and bilateral adnexa are unremarkable.  Other: Small fat containing umbilical hernia. No intra-abdominal pelvic ascites.  Musculoskeletal: Multilevel degenerative change throughout spine. Moderate degenerative change in the hips, right greater than left. There are no acute or suspicious osseous abnormalities.  IMPRESSION: 1. Obstructing 9 x 8 mm stone in the  distal left ureter with moderate proximal hydroureteronephrosis, perinephric and periureteric edema. The degree of surrounding soft tissue inflammation can be seen in the setting of concurrent urinary tract infection. 2. Hepatic steatosis and hepatomegaly. Probable sludge and small stones in the gallbladder. 3. Colonic diverticulosis without diverticulitis.   Electronically Signed   By: Jeb Levering M.D.   On: 06/13/2016 23:01  CT scan personally reviewed today and with the patient.  Assessment & Plan:  55 year old female with a 9 mm left UVJ stone with significant perinephric stranding and moderate hydroureteronephrosis. She does have a mildly positive UA although this appears to be likely a contaminant, urine culture pending. No other signs or symptoms of infection.  1. Left ureteral calculus We discussed various treatment options including ESWL vs. ureteroscopy, laser lithotripsy, and stent  Vs. Continued medical expulsive therapy. We discussed the risks and benefits of both including bleeding, infection, damage to surrounding structures, efficacy with need for possible further intervention, and need for temporary ureteral stent.  Given the size of the stone, I would recommend proceeding with intervention as it is unlikely to pass spontaneously. Given her habitus, I feel that she is a better candidate for ureteroscopy. She is agreeable with this plan.  Warning symptoms including fever, chills, malaise, or any other abnormality including severe uncontrolled pain were discussed in detail. We discussed there should be a very low threshold to present to the emergency room and she feels like something is wrong.  We'll plan to get her scheduled next week for ureteroscopy with Dr. Pilar Jarvis on Thursday. All her questions were answered today.  2. Hydronephrosis, left Secondary to #1  Hollice Espy, MD  [facility] [address], [place],   [address] [phone]

## 2016-06-20 NOTE — Discharge Instructions (Signed)

## 2016-06-20 NOTE — Anesthesia Postprocedure Evaluation (Signed)
Anesthesia Post Note  Patient: Sharon Holden  Procedure(s) Performed: Procedure(s) (LRB): URETEROSCOPY WITH HOLMIUM LASER LITHOTRIPSY (Left) CYSTOSCOPY WITH STENT PLACEMENT (Left)  Patient location during evaluation: PACU Anesthesia Type: General Level of consciousness: awake and alert Pain management: pain level controlled Vital Signs Assessment: post-procedure vital signs reviewed and stable Respiratory status: spontaneous breathing and respiratory function stable Cardiovascular status: stable Anesthetic complications: no    Last Vitals:  Vitals:   06/20/16 1550 06/20/16 1605  BP: (!) 150/83 (!) 146/87  Pulse: 65 64  Resp: (!) 27 11  Temp:  36.9 C    Last Pain:  Vitals:   06/20/16 1605  TempSrc:   PainSc: 1                  Lindey Renzulli K

## 2016-06-21 ENCOUNTER — Encounter: Payer: Self-pay | Admitting: Urology

## 2016-06-26 ENCOUNTER — Ambulatory Visit (INDEPENDENT_AMBULATORY_CARE_PROVIDER_SITE_OTHER): Payer: BLUE CROSS/BLUE SHIELD | Admitting: Urology

## 2016-06-26 VITALS — BP 143/93 | HR 81 | Ht 68.0 in | Wt 329.7 lb

## 2016-06-26 DIAGNOSIS — N201 Calculus of ureter: Secondary | ICD-10-CM

## 2016-06-26 LAB — MICROSCOPIC EXAMINATION
Bacteria, UA: NONE SEEN
Epithelial Cells (non renal): >10 /hpf — AB (ref 0–10)
RBC, UA: >30 /hpf — AB (ref 0–?)

## 2016-06-26 LAB — URINALYSIS, COMPLETE
Bilirubin, UA: NEGATIVE
Glucose, UA: NEGATIVE
Ketones, UA: NEGATIVE
Nitrite, UA: NEGATIVE
Specific Gravity, UA: 1.02 (ref 1.005–1.030)
Urobilinogen, Ur: 0.2 mg/dL (ref 0.2–1.0)
pH, UA: 7 (ref 5.0–7.5)

## 2016-06-26 MED ORDER — CIPROFLOXACIN HCL 500 MG PO TABS
500.0000 mg | ORAL_TABLET | Freq: Once | ORAL | Status: AC
  Administered 2016-06-26: 500 mg via ORAL

## 2016-06-26 MED ORDER — LIDOCAINE HCL 2 % EX GEL
1.0000 "application " | Freq: Once | CUTANEOUS | Status: AC
  Administered 2016-06-26: 1 via URETHRAL

## 2016-06-26 NOTE — Progress Notes (Signed)
S/p Left URS/HLL/stent 06/20/2016. Here for stent removal. She is on cephalexin.   Marland KitchenCystoscopy/ Stent removal procedure  Patient identification was confirmed, informed consent was obtained, and patient was prepped using Betadine solution.  Lidocaine jelly was administered per urethral meatus.    Procedure: - Flexible cystoscope introduced, without any difficulty.   - Thorough search of the bladder revealed:    normal urethral meatus  Stent seen emanating from LEFT ureteral orifice, grasped with stent graspers, and removed in entirety.  Moderate debris in bladder limiting visualization.  Post-Procedure: - Patient tolerated the procedure well -f/u 1 mo with left renal u/s -cont abx for three more days

## 2016-07-02 LAB — STONE ANALYSIS
Ca Oxalate,Dihydrate: 5 %
Ca Oxalate,Monohydr.: 90 %
Ca phos cry stone ql IR: 5 %
Stone Weight KSTONE: 109 mg

## 2016-07-26 ENCOUNTER — Ambulatory Visit: Payer: BLUE CROSS/BLUE SHIELD

## 2016-07-29 ENCOUNTER — Telehealth: Payer: Self-pay

## 2016-07-29 ENCOUNTER — Ambulatory Visit: Payer: BLUE CROSS/BLUE SHIELD | Admitting: Urology

## 2016-07-29 NOTE — Telephone Encounter (Signed)
I called the patient and gave her the number to call and reschd her RUS. She said she would call today to do this. I asked her to call me back to reschd her follow up appt with [place] once she did this.  Thanks, Sharyn Lull

## 2017-02-03 ENCOUNTER — Emergency Department: Payer: BLUE CROSS/BLUE SHIELD

## 2017-02-03 ENCOUNTER — Emergency Department
Admission: EM | Admit: 2017-02-03 | Discharge: 2017-02-03 | Disposition: A | Discharge status: Home / Self Care | Payer: BLUE CROSS/BLUE SHIELD | Attending: Emergency Medicine | Admitting: Emergency Medicine

## 2017-02-03 ENCOUNTER — Encounter: Payer: Self-pay | Admitting: Emergency Medicine

## 2017-02-03 DIAGNOSIS — I10 Essential (primary) hypertension: Secondary | ICD-10-CM | POA: Insufficient documentation

## 2017-02-03 DIAGNOSIS — R109 Unspecified abdominal pain: Secondary | ICD-10-CM

## 2017-02-03 DIAGNOSIS — Z79899 Other long term (current) drug therapy: Secondary | ICD-10-CM | POA: Diagnosis not present

## 2017-02-03 LAB — URINALYSIS, ROUTINE W REFLEX MICROSCOPIC
Bilirubin Urine: NEGATIVE
Glucose, UA: NEGATIVE mg/dL
Hgb urine dipstick: NEGATIVE
Ketones, ur: NEGATIVE mg/dL
Leukocytes, UA: NEGATIVE
Nitrite: NEGATIVE
Protein, ur: NEGATIVE mg/dL
Specific Gravity, Urine: 1.02 (ref 1.005–1.030)
pH: 6 (ref 5.0–8.0)

## 2017-02-03 LAB — COMPREHENSIVE METABOLIC PANEL
ALT: 11 U/L — ABNORMAL LOW (ref 14–54)
AST: 17 U/L (ref 15–41)
Albumin: 4.2 g/dL (ref 3.5–5.0)
Alkaline Phosphatase: 99 U/L (ref 38–126)
Anion gap: 6 (ref 5–15)
BUN: 12 mg/dL (ref 6–20)
CO2: 28 mmol/L (ref 22–32)
Calcium: 9.2 mg/dL (ref 8.9–10.3)
Chloride: 105 mmol/L (ref 101–111)
Creatinine, Ser: 0.9 mg/dL (ref 0.44–1.00)
GFR calc Af Amer: >60 mL/min (ref >60)
GFR calc non Af Amer: >60 mL/min (ref >60)
Glucose, Bld: 117 mg/dL — ABNORMAL HIGH (ref 65–99)
Potassium: 3.9 mmol/L (ref 3.5–5.1)
Sodium: 139 mmol/L (ref 135–145)
Total Bilirubin: 1.3 mg/dL — ABNORMAL HIGH (ref 0.3–1.2)
Total Protein: 7.4 g/dL (ref 6.5–8.1)

## 2017-02-03 LAB — CBC
HCT: 40.2 % (ref 35.0–47.0)
Hemoglobin: 13.5 g/dL (ref 12.0–16.0)
MCH: 30.6 pg (ref 26.0–34.0)
MCHC: 33.7 g/dL (ref 32.0–36.0)
MCV: 90.9 fL (ref 80.0–100.0)
Platelets: 192 10*3/uL (ref 150–440)
RBC: 4.42 MIL/uL (ref 3.80–5.20)
RDW: 14.1 % (ref 11.5–14.5)
WBC: 6.6 10*3/uL (ref 3.6–11.0)

## 2017-02-03 LAB — LIPASE, BLOOD: Lipase: 30 U/L (ref 11–51)

## 2017-02-03 IMAGING — CT CT RENAL STONE PROTOCOL
3 of 5 series · 17 of 46 positions shown, 19 images · non-contrast
Comparison: [DATE]

CLINICAL DATA: Right-sided flank pain for 1 week

EXAM:
CT ABDOMEN AND PELVIS WITHOUT CONTRAST
TECHNIQUE: Multidetector CT imaging of the abdomen and pelvis was performed
following the standard protocol without IV contrast.

[Series 2: stone full standard · axial · 0.71mm/px · z∈[-892,-527]mm · 12 of 87 slices shown, 14 images]
[im 7/87  soft-tissue]
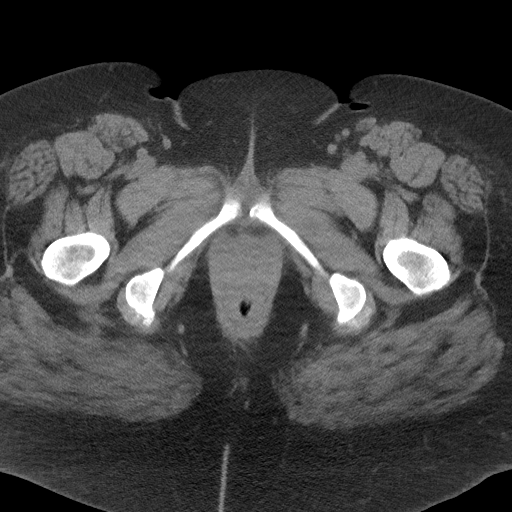
[im 7/87  bone]
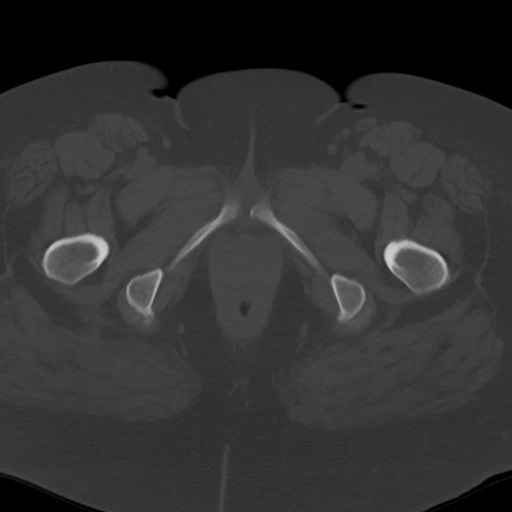
[im 14/87  soft-tissue]
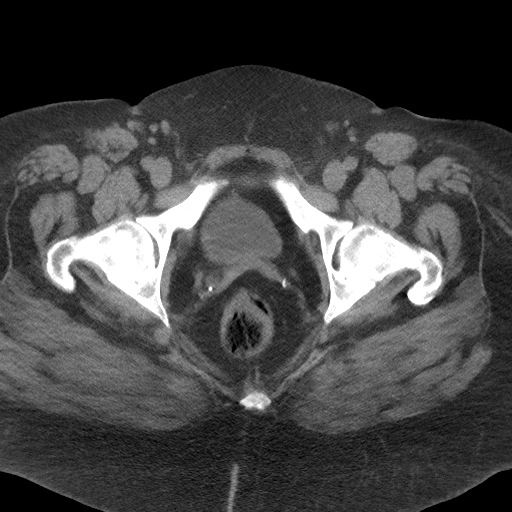
[im 20/87  soft-tissue]
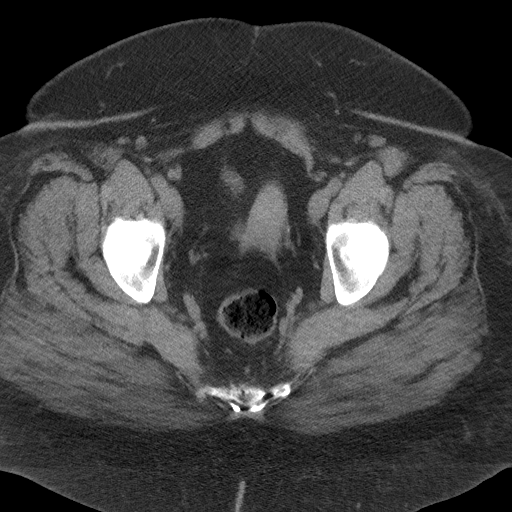
[im 27/87  soft-tissue]
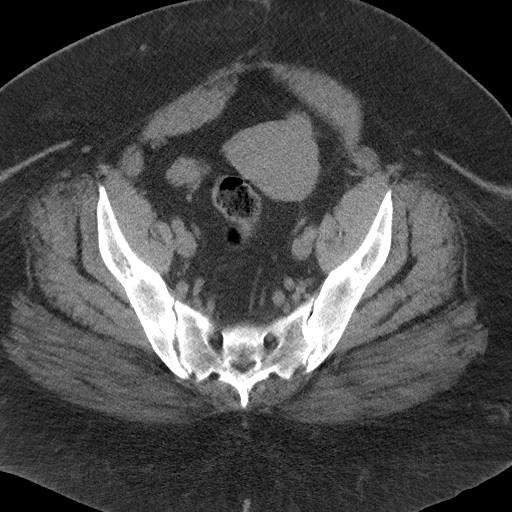
[im 34/87  soft-tissue]
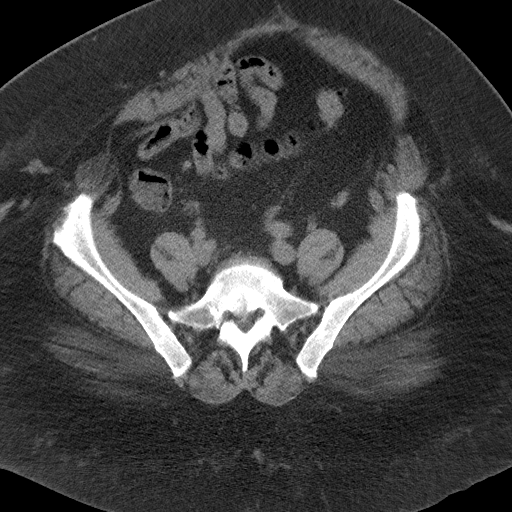
[im 40/87  soft-tissue]
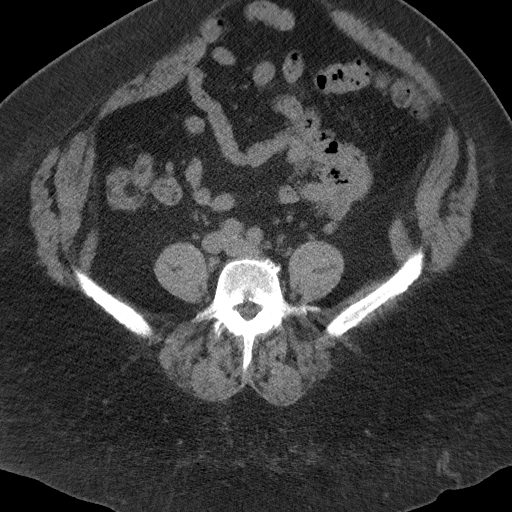
[im 47/87  soft-tissue]
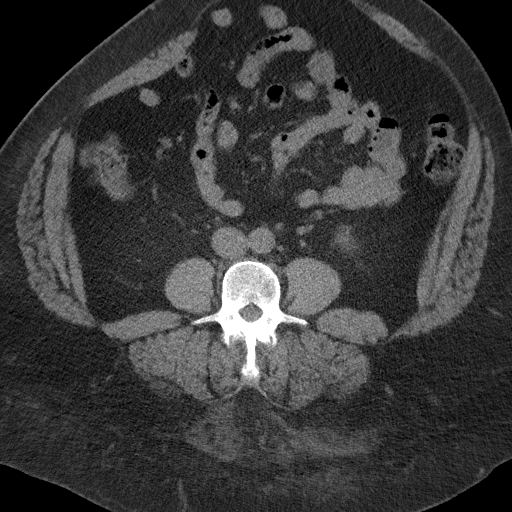
[im 53/87  soft-tissue]
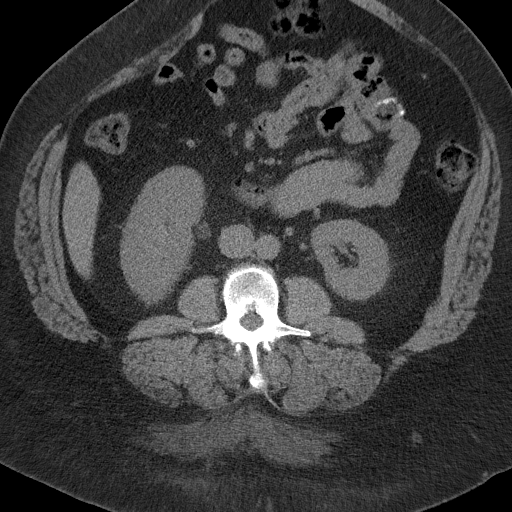
[im 60/87  soft-tissue]
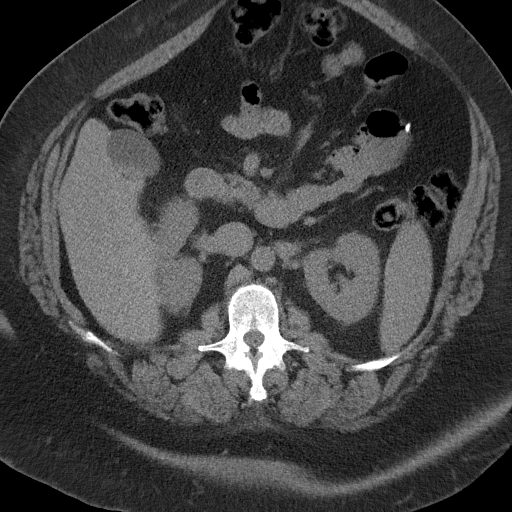
[im 60/87  bone]
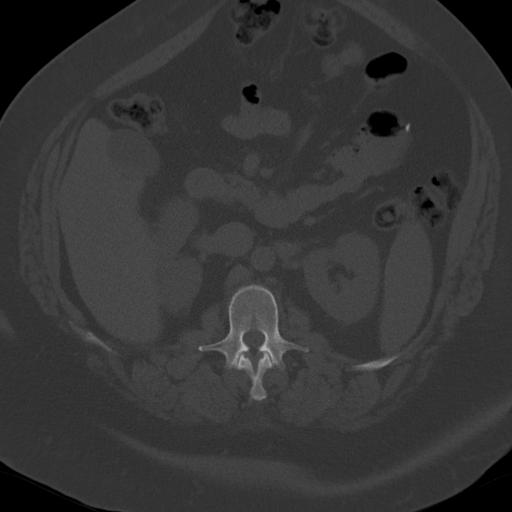
[im 67/87  soft-tissue]
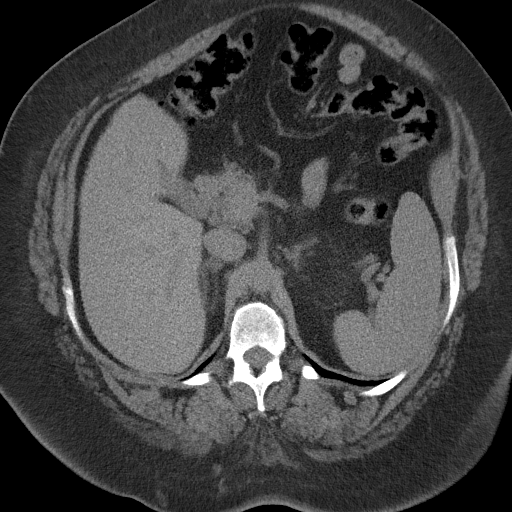
[im 73/87  soft-tissue]
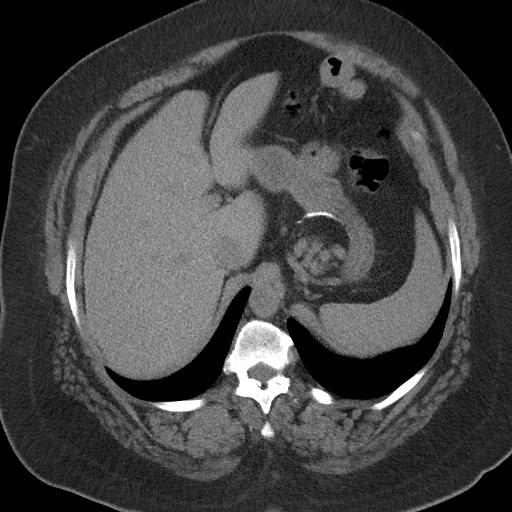
[im 80/87  soft-tissue]
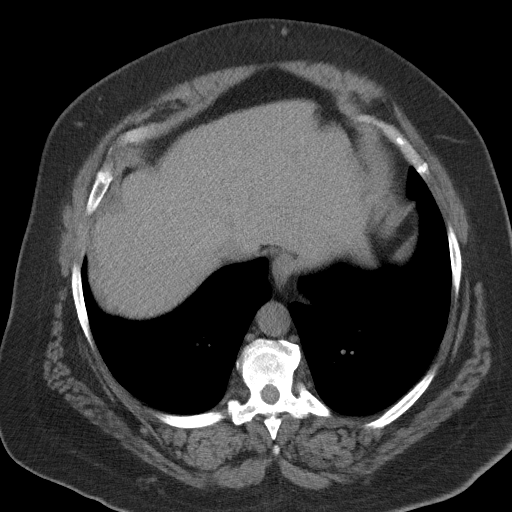

[Series 4: lung bases · axial · 0.71mm/px · z∈[-562,-527]mm · 2 of 22 slices shown]
[im 8/22  bone]
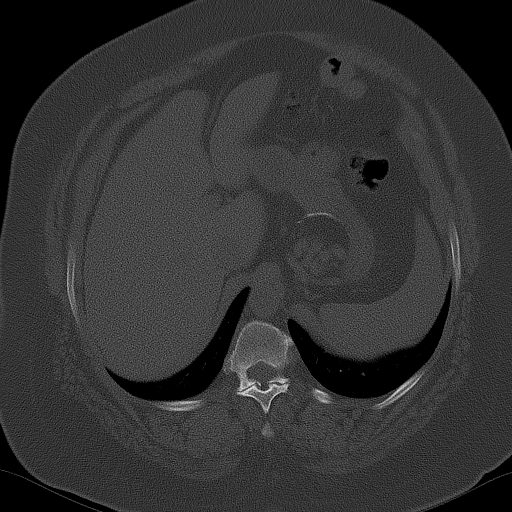
[im 15/22  bone]
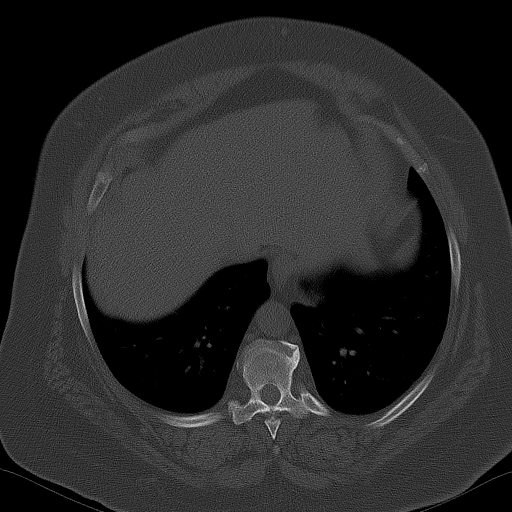

[Series 5: coronal · coronal · 0.86mm/px · 3 of 202 slices shown]
[im 68/202  soft-tissue]
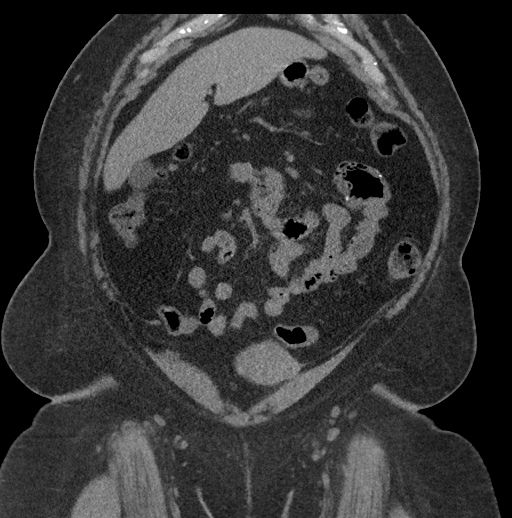
[im 90/202  soft-tissue]
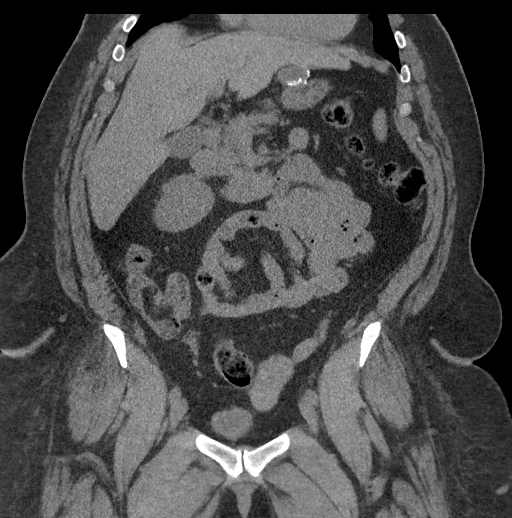
[im 112/202  soft-tissue]
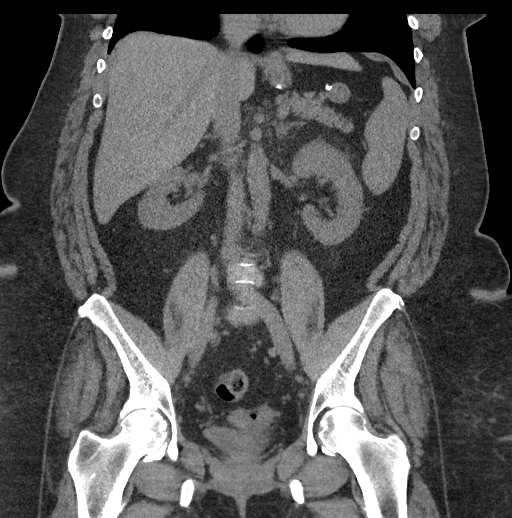

[17 of 46 positions shown; findings below may reference images not displayed]

FINDINGS: Lower chest:  No contributory findings.

Hepatobiliary: No focal liver abnormality.Layering sludge or
calculi. No evidence of biliary inflammation or obstruction.

Pancreas: No acute finding. Smooth 18 mm nodule near the tail is
stable and most consistent with a splenule.

Spleen: Unremarkable.

Adrenals/Urinary Tract: Negative adrenals. No hydronephrosis or
stone. Unremarkable bladder.

Stomach/Bowel: Status post gastric bypass. No obstruction or
inflammation noted. No appendicitis. Mild colonic diverticulosis.

Vascular/Lymphatic: No acute vascular abnormality. No mass or
adenopathy.

Reproductive:Negative

Other: No ascites or pneumoperitoneum.

Lax abdominal wall with diastasis rectus.

Musculoskeletal: Lumbar disc and facet degeneration. Bilateral
foraminal stenosis at L5-S1.
IMPRESSION: 1. No acute finding.  No hydronephrosis or urolithiasis.
2. Layering sludge or calculi in the gallbladder. No evidence of
cholecystitis.
3. Gastric bypass without complicating feature.

## 2017-02-03 MED ORDER — HYDROCODONE-ACETAMINOPHEN 5-325 MG PO TABS: 1.0000 | ORAL_TABLET | ORAL | 0 refills | Status: DC | PRN

## 2017-02-03 MED ORDER — OXYCODONE-ACETAMINOPHEN 5-325 MG PO TABS
2.0000 | ORAL_TABLET | Freq: Once | ORAL | Status: AC
  Administered 2017-02-03: 2 via ORAL
  Filled 2017-02-03: qty 2

## 2017-02-03 MED ORDER — PANTOPRAZOLE SODIUM 20 MG PO TBEC: 20.0000 mg | DELAYED_RELEASE_TABLET | Freq: Every day | ORAL | 1 refills | Status: DC

## 2017-02-03 NOTE — ED Triage Notes (Signed)
Pt with right side flank pain for a week. Pt with hx of kidney stones. Pain is worse today. Denies blood in her urine.

## 2017-02-03 NOTE — ED Notes (Signed)
Electronic signature pad not working at this time. Discharge consent signed and placed in paper chart.

## 2017-02-03 NOTE — ED Notes (Signed)
Patient transported to CT 

## 2017-02-03 NOTE — ED Provider Notes (Signed)
[facility] Emergency Department Provider Note  Time seen: 7:50 AM  I have reviewed the triage vital signs and the nursing notes.   HISTORY  Chief Complaint Flank Pain    HPI Sharon Holden is a 56 y.o. female with a past medical history of hypertension, kidney stones, presents to the emergency department with 1 week of intermittent right flank pain. According to the patient for the past one week she has had right flank pain coming and going. Patient states over the past 24 hours it has been worse and more constant. Has a history of kidney stones 2 in the past had to have a stone retrieval performed last October. Patient denies any fever, vomiting or diarrhea although states some nausea at times. Denies any dysuria, hematuria or frequency. Currently describes the right flank pain as moderate somewhat sharp pain.  Past Medical History:  Diagnosis Date  . Arthritis   . History of kidney stones   . Hypertension   . Kidney stones   . Sleep apnea    can't wear cpap    There are no active problems to display for this patient.   Past Surgical History:  Procedure Laterality Date  . CESAREAN SECTION     x 2  . CYSTOSCOPY WITH STENT PLACEMENT Left 06/20/2016   Procedure: CYSTOSCOPY WITH STENT PLACEMENT;  Surgeon: Nickie Retort, MD;  Location: [facility] ORS;  Service: Urology;  Laterality: Left;  Marland Kitchen GASTRIC BYPASS    . URETEROSCOPY WITH HOLMIUM LASER LITHOTRIPSY Left 06/20/2016   Procedure: URETEROSCOPY WITH HOLMIUM LASER LITHOTRIPSY;  Surgeon: Nickie Retort, MD;  Location: [facility] ORS;  Service: Urology;  Laterality: Left;    Prior to Admission medications   Medication Sig Start Date End Date Taking? Authorizing Provider  cephALEXin (KEFLEX) 500 MG capsule Take 1 capsule (500 mg total) by mouth 3 (three) times daily. 06/20/16   Nickie Retort, MD  docusate sodium (COLACE) 100 MG capsule Take 1 tablet once or twice daily as needed for constipation  while taking narcotic pain medicine Patient not taking: Reported on 06/26/2016 06/14/16   Hinda Kehr, MD  lisinopril-hydrochlorothiazide (PRINZIDE,ZESTORETIC) 20-12.5 MG per tablet Take 1 tablet by mouth daily.    [provider]  meloxicam (MOBIC) 7.5 MG tablet Take 7.5 mg by mouth daily.    [provider]  ondansetron (ZOFRAN ODT) 4 MG disintegrating tablet Allow 1-2 tablets to dissolve in your mouth every 8 hours as needed for nausea/vomiting Patient not taking: Reported on 06/26/2016 06/14/16   Hinda Kehr, MD  oxyCODONE-acetaminophen (ROXICET) 5-325 MG tablet Take 1-2 tablets by mouth every 4 (four) hours as needed for severe pain. 06/20/16   Nickie Retort, MD    No Known Allergies  Family History  Problem Relation Age of Onset  . Bladder Cancer Neg Hx   . Prostate cancer Neg Hx   . Kidney cancer Neg Hx     Social History Social History  Substance Use Topics  . Smoking status: Never Smoker  . Smokeless tobacco: Never Used  . Alcohol use Yes    Review of Systems Constitutional: Negative for fever Cardiovascular: Negative for chest pain. Respiratory: Negative for shortness of breath. Gastrointestinal: Right flank pain. Positive for nausea. Negative for vomiting or diarrhea Genitourinary: Negative for dysuria. Negative for hematuria Musculoskeletal: Some right back pain Skin: Negative for rash. Neurological: Negative for headache All other ROS negative  ____________________________________________   PHYSICAL EXAM:  VITAL SIGNS: ED Triage Vitals [02/03/17 0737]  Enc Vitals Group     BP (!) 169/98     Pulse Rate 83     Resp 20     Temp 98.4 F (36.9 C)     Temp Source Oral     SpO2 100 %     Weight (!) 329 lb (149.2 kg)     Height      Head Circumference      Peak Flow      Pain Score 8     Pain Loc      Pain Edu?      Excl. in [place]?     Constitutional: Alert and oriented. Well appearing and in no distress. Eyes: Normal  exam ENT   Head: Normocephalic and atraumatic   Mouth/Throat: Mucous membranes are moist. Cardiovascular: Normal rate, regular rhythm. No murmur Respiratory: Normal respiratory effort without tachypnea nor retractions. Breath sounds are clear Gastrointestinal: Soft and nontender. No distention.  Obese. Nontender abdominal exam. Musculoskeletal: Nontender with normal range of motion in all extremities.  Neurologic:  Normal speech and language. No gross focal neurologic deficits  Skin:  Skin is warm, dry and intact.  Psychiatric: Mood and affect are normal.   ____________________________________________   RADIOLOGY  CT negative, besides gallbladder sludge.  ____________________________________________   INITIAL IMPRESSION / ASSESSMENT AND PLAN / ED COURSE  Pertinent labs & imaging results that were available during my care of the patient were reviewed by me and considered in my medical decision making (see chart for details).  The patient presents to the emergency department 1 week of intermittent right flank pain. Describes the pain as moderate, sharp, more constant over the past 24 hours. We will check labs, urinalysis, CT renal scan and closely monitor. Symptoms are very suggestive of ureterolithiasis.  CT scan largely negative. Patient's labs are normal. We will discharge with a short course of pain medication as well as Protonix. The patient will follow up with her doctor this week. I discussed return precautions with the patient.  ____________________________________________   FINAL CLINICAL IMPRESSION(S) / ED DIAGNOSES  Abdominal pain    Harvest Dark, MD 02/03/17 [phone]

## 2017-02-04 LAB — URINE CULTURE

## 2017-06-06 DIAGNOSIS — R5382 Chronic fatigue, unspecified: Secondary | ICD-10-CM | POA: Insufficient documentation

## 2017-06-06 DIAGNOSIS — Z9884 Bariatric surgery status: Secondary | ICD-10-CM | POA: Insufficient documentation

## 2017-06-06 DIAGNOSIS — I10 Essential (primary) hypertension: Secondary | ICD-10-CM | POA: Insufficient documentation

## 2017-06-06 DIAGNOSIS — G4733 Obstructive sleep apnea (adult) (pediatric): Secondary | ICD-10-CM | POA: Insufficient documentation

## 2017-06-12 DIAGNOSIS — E509 Vitamin A deficiency, unspecified: Secondary | ICD-10-CM | POA: Insufficient documentation

## 2017-06-12 DIAGNOSIS — R7989 Other specified abnormal findings of blood chemistry: Secondary | ICD-10-CM | POA: Insufficient documentation

## 2017-06-12 DIAGNOSIS — E538 Deficiency of other specified B group vitamins: Secondary | ICD-10-CM | POA: Insufficient documentation

## 2017-06-12 DIAGNOSIS — E559 Vitamin D deficiency, unspecified: Secondary | ICD-10-CM | POA: Insufficient documentation

## 2019-12-06 ENCOUNTER — Ambulatory Visit: Payer: BLUE CROSS/BLUE SHIELD

## 2020-12-25 ENCOUNTER — Other Ambulatory Visit: Payer: Self-pay | Admitting: Family Medicine

## 2020-12-25 ENCOUNTER — Ambulatory Visit
Admission: RE | Admit: 2020-12-25 | Discharge: 2020-12-25 | Disposition: A | Discharge status: Home / Self Care | Payer: BLUE CROSS/BLUE SHIELD | Source: Ambulatory Visit | Attending: Family Medicine | Admitting: Family Medicine

## 2020-12-25 ENCOUNTER — Ambulatory Visit
Admission: RE | Admit: 2020-12-25 | Discharge: 2020-12-25 | Disposition: A | Discharge status: Home / Self Care | Payer: BLUE CROSS/BLUE SHIELD | Attending: Family Medicine | Admitting: Family Medicine

## 2020-12-25 DIAGNOSIS — M25561 Pain in right knee: Secondary | ICD-10-CM

## 2020-12-25 IMAGING — CR DG KNEE COMPLETE 4+V*R*
4 series · 4 of 4 positions shown · non-contrast
Comparison: None.

CLINICAL DATA: Right knee pain over the last several months with
difficulty walking.

EXAM:
RIGHT KNEE - COMPLETE 4+ VIEW

[knee ap (1 of 2)]
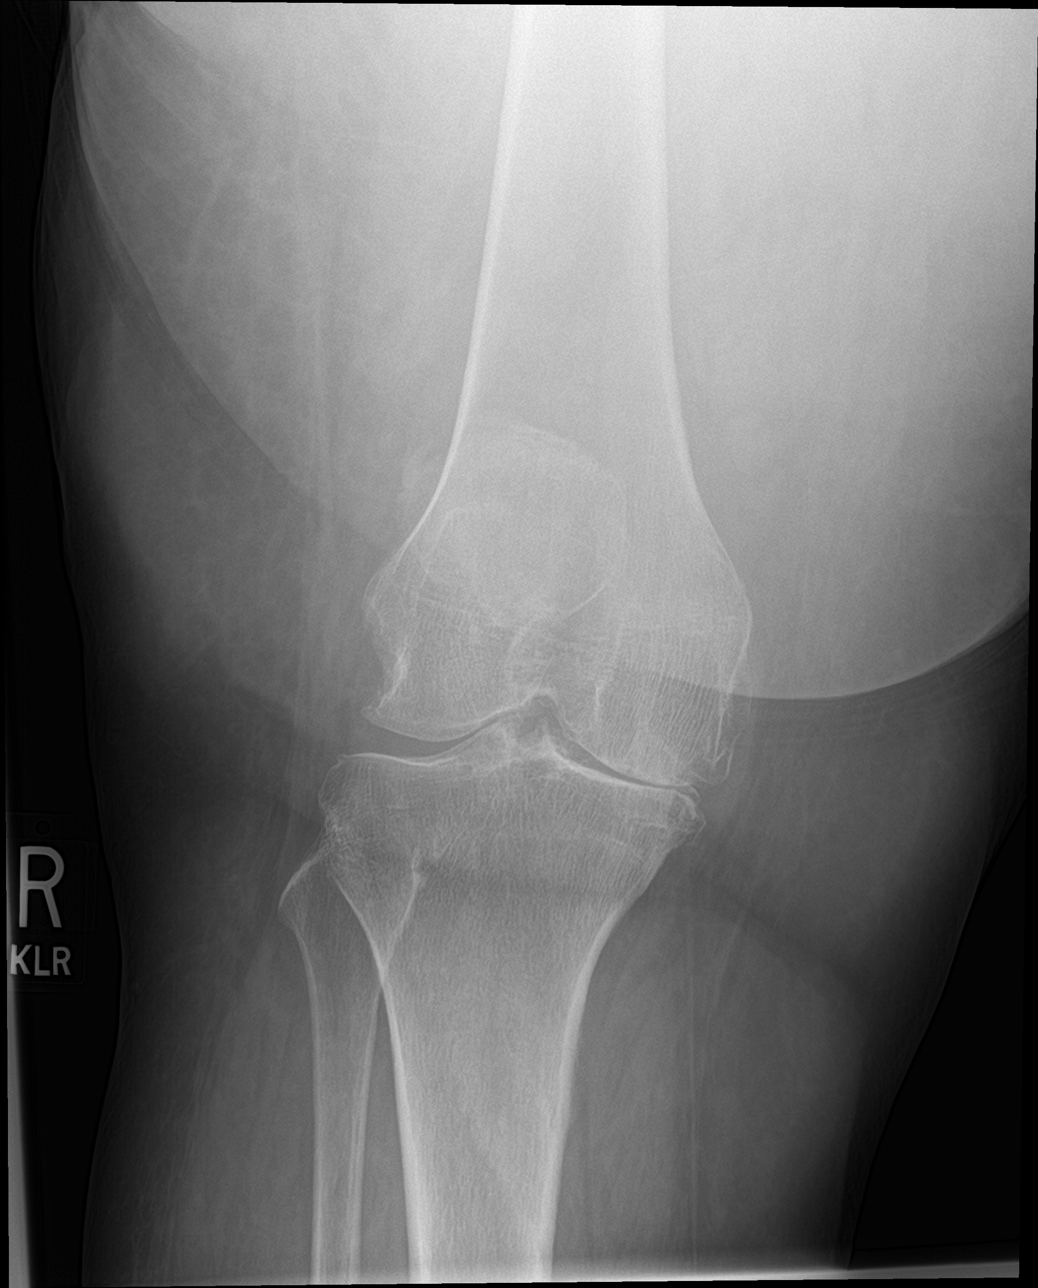

[knee lat]
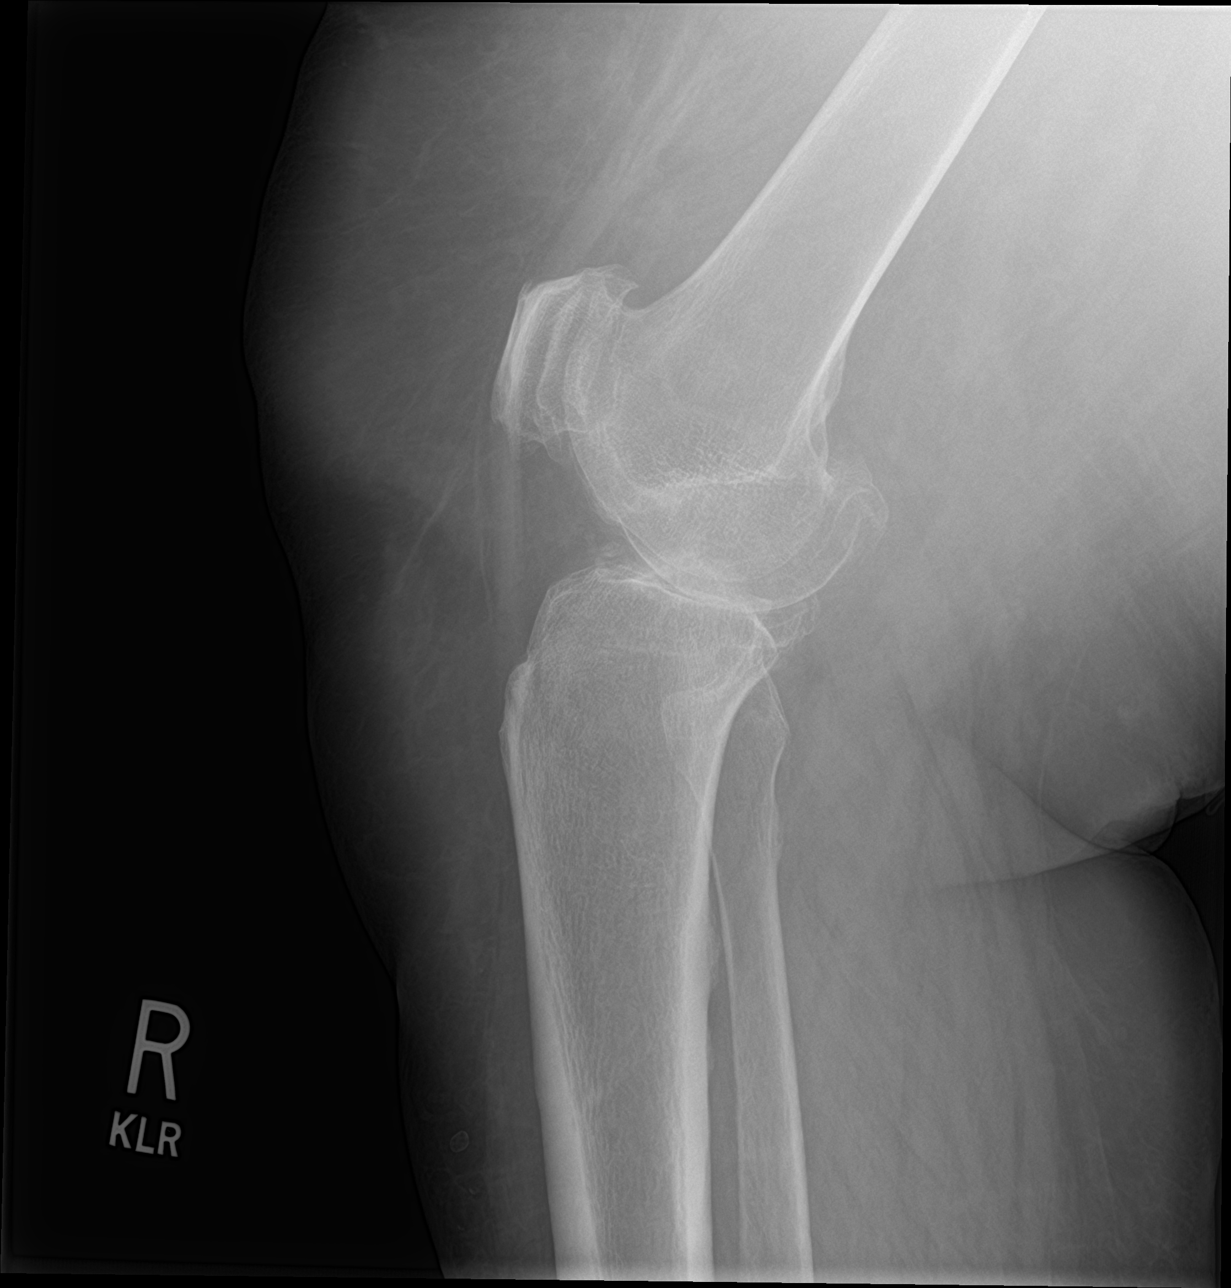

[sunrise]
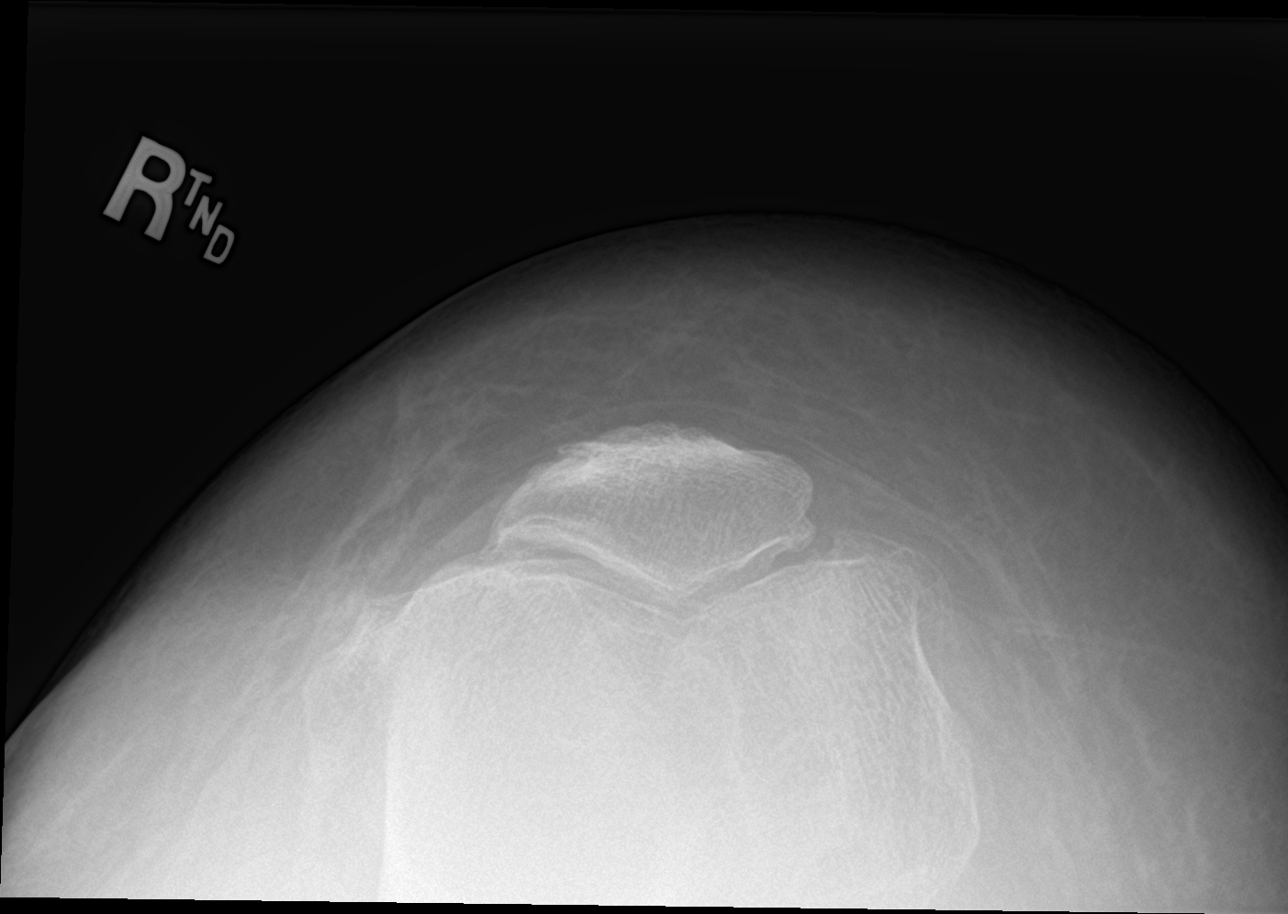

[knee ap (2 of 2)]
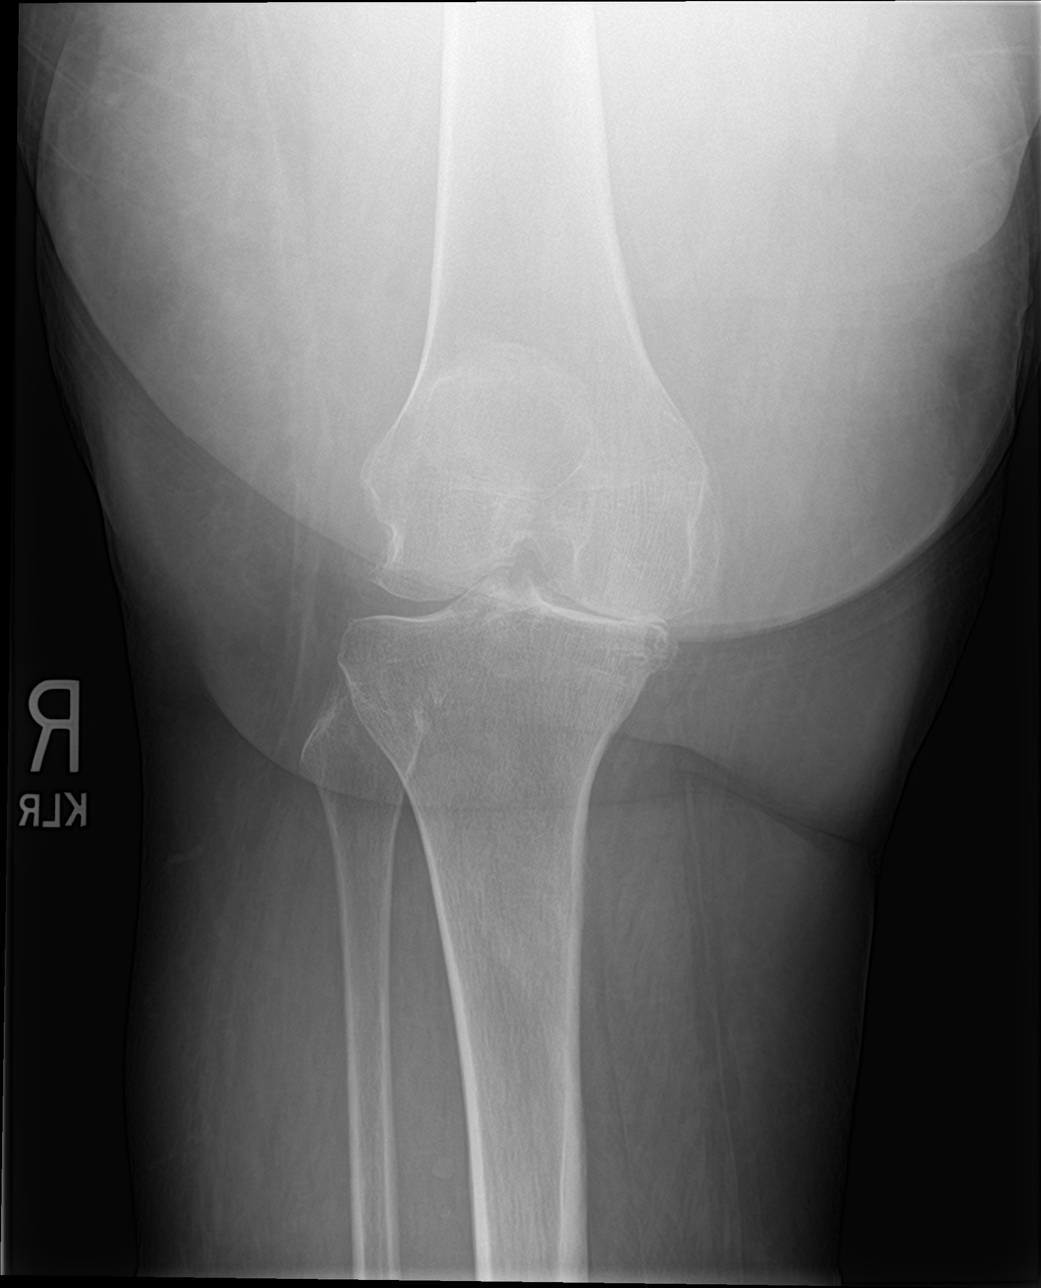

[4 of 4 positions shown; findings below may reference images not displayed]

FINDINGS: Osteoarthritis of the knee, most pronounced in the medial
compartment with near complete loss of joint height and marginal
osteophytes. Considerable degenerative change also at the
patellofemoral joint. Lateral compartment is better preserved. There
is only a tiny amount of joint fluid. No focal bone lesion.
IMPRESSION: Osteoarthritis of the knee, most pronounced in the medial
compartment. Patellofemoral osteoarthritis also present. Small joint
effusion.

## 2021-01-08 ENCOUNTER — Other Ambulatory Visit: Payer: Self-pay | Admitting: Family Medicine

## 2021-01-08 DIAGNOSIS — R2241 Localized swelling, mass and lump, right lower limb: Secondary | ICD-10-CM

## 2021-01-10 ENCOUNTER — Other Ambulatory Visit: Payer: Self-pay

## 2021-01-10 ENCOUNTER — Ambulatory Visit
Admission: RE | Admit: 2021-01-10 | Discharge: 2021-01-10 | Disposition: A | Discharge status: Home / Self Care | Payer: BC Managed Care – PPO | Source: Ambulatory Visit | Attending: Family Medicine | Admitting: Family Medicine

## 2021-01-10 DIAGNOSIS — R2241 Localized swelling, mass and lump, right lower limb: Secondary | ICD-10-CM | POA: Diagnosis not present

## 2021-01-10 IMAGING — US US EXTREM LOW*R* LIMITED
1 series · 12 of 12 positions shown · non-contrast
Comparison: None.

CLINICAL DATA: Mass along the medial aspect of the right calf for 1
year.

EXAM:
ULTRASOUND RIGHT LOWER EXTREMITY LIMITED
TECHNIQUE: Ultrasound examination of the lower extremity soft tissues was
performed in the area of clinical concern.

[Series 1: us soft tissue lower extremity limited right (non- · 12 acquisitions, 12 frames shown]
[im 1/12]
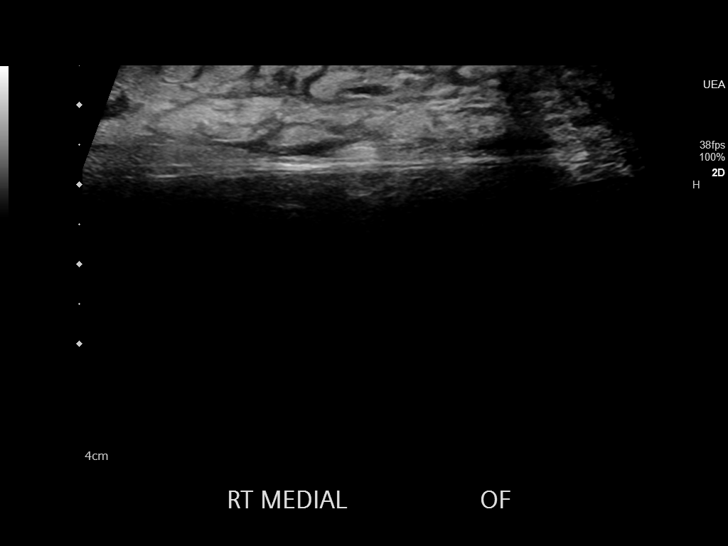
[im 2/12]
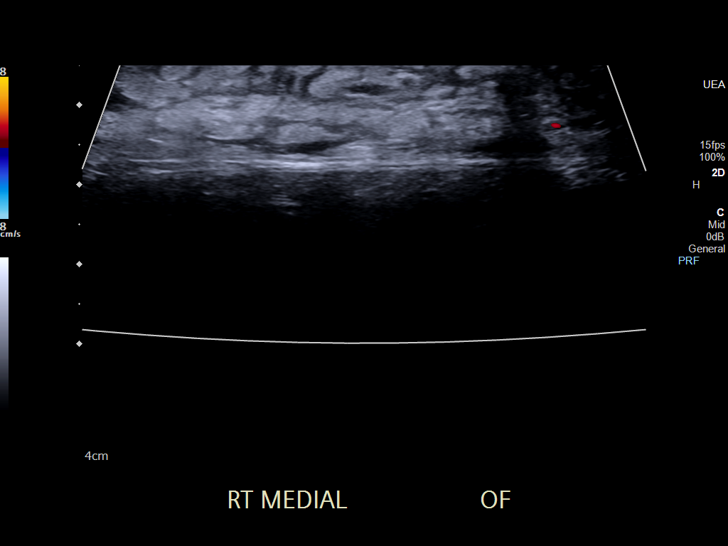
[im 3/12]
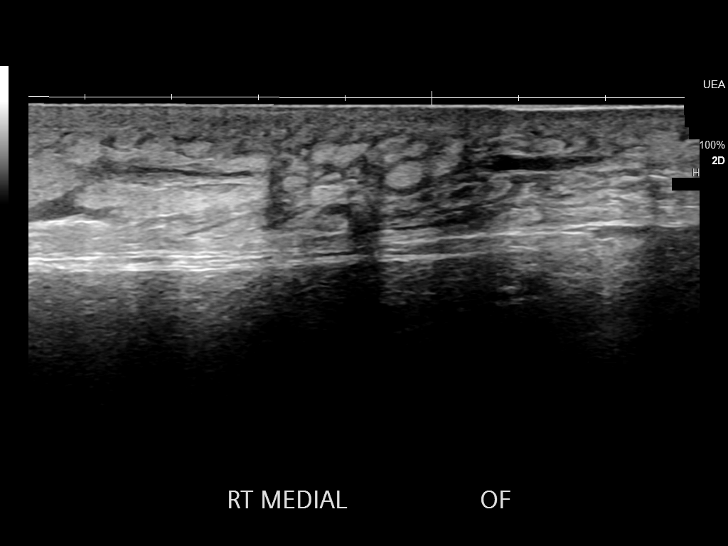
[im 4/12]
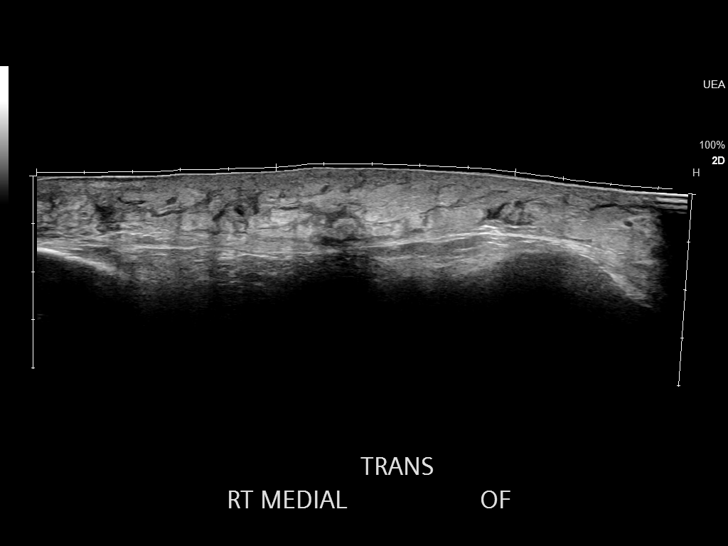
[im 5/12]
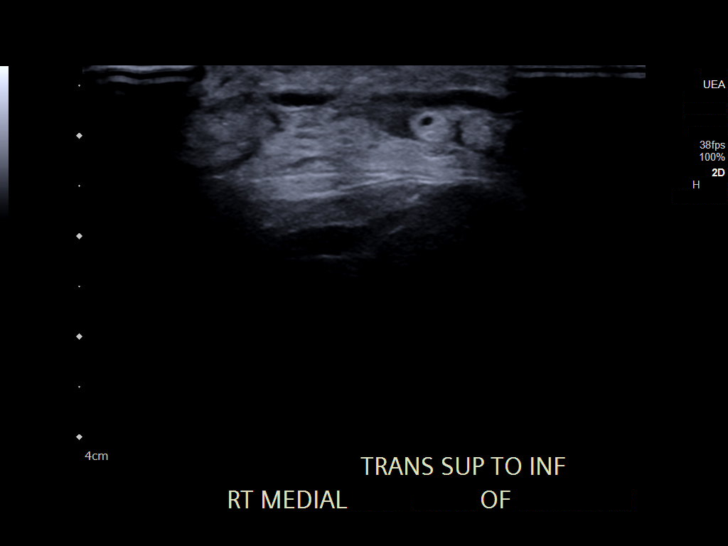
[im 6/12]
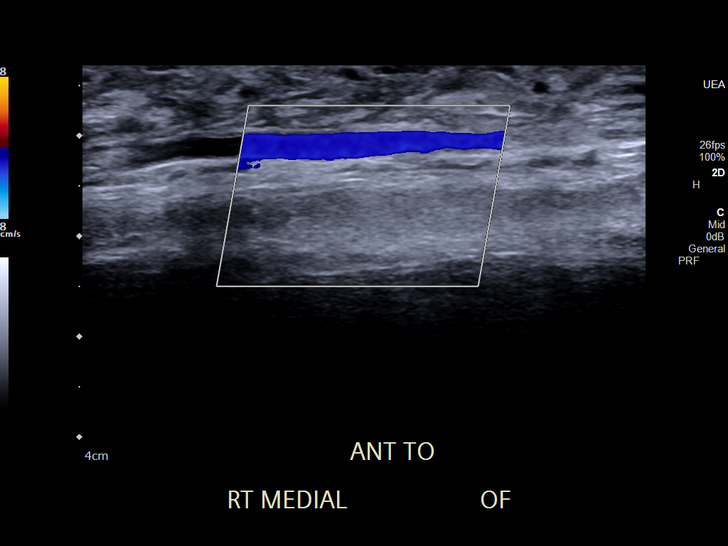
[im 7/12]
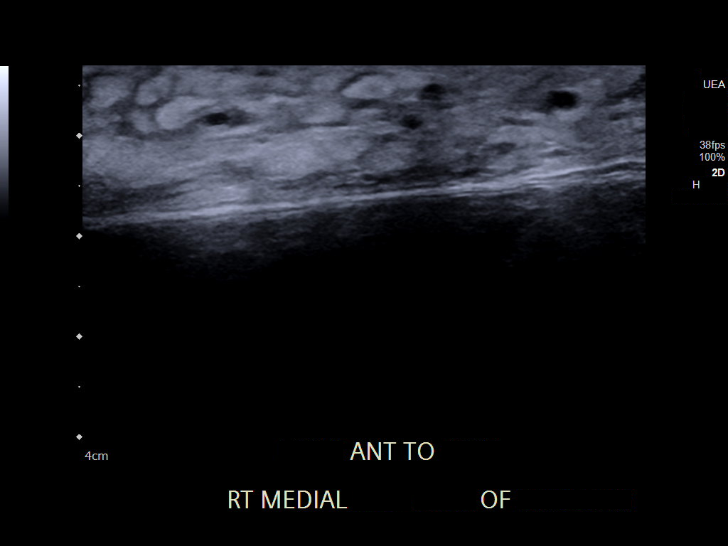
[im 8/12]
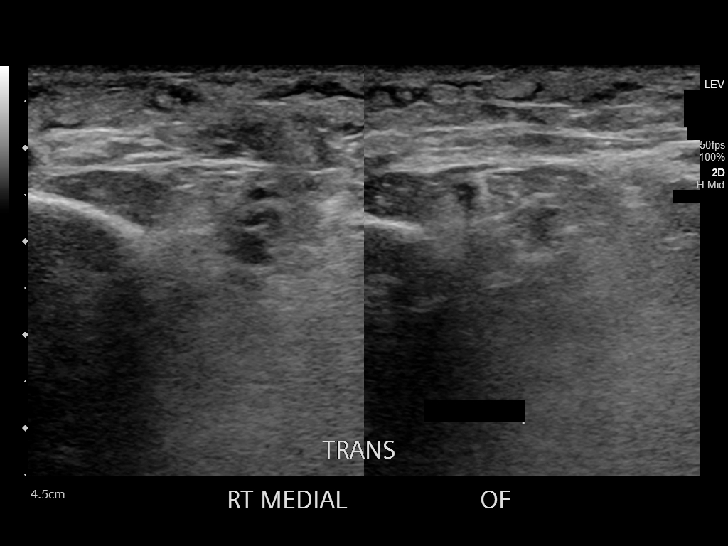
[im 9/12]
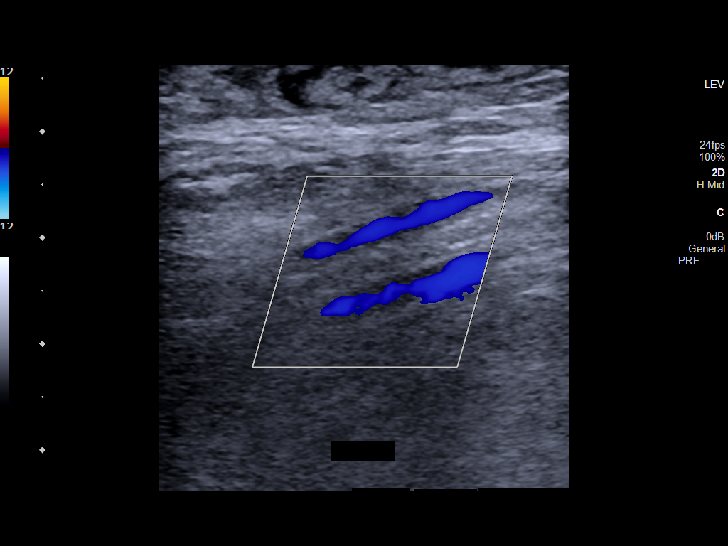
[im 10/12]
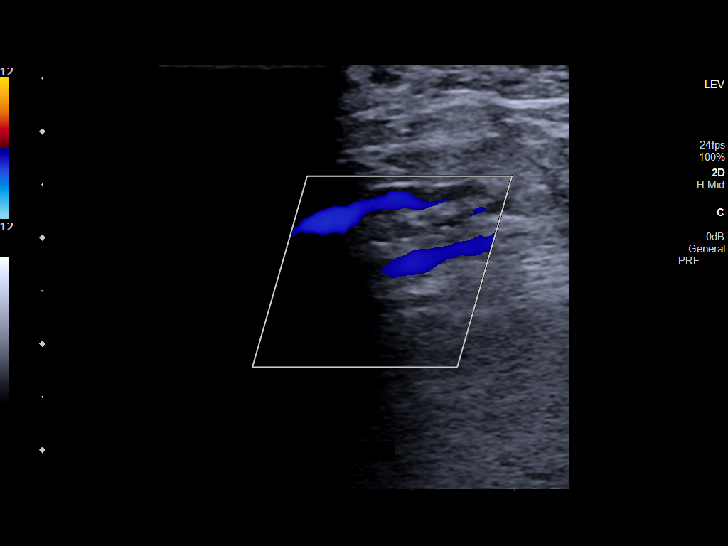
[im 11/12]
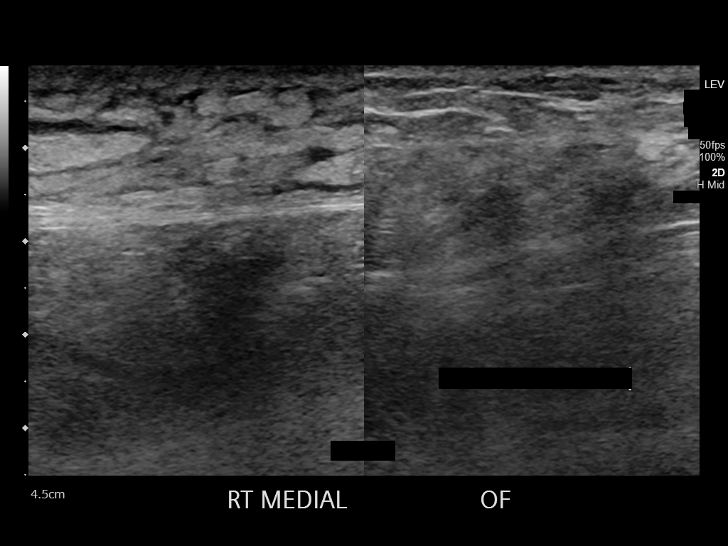
[im 12/12]
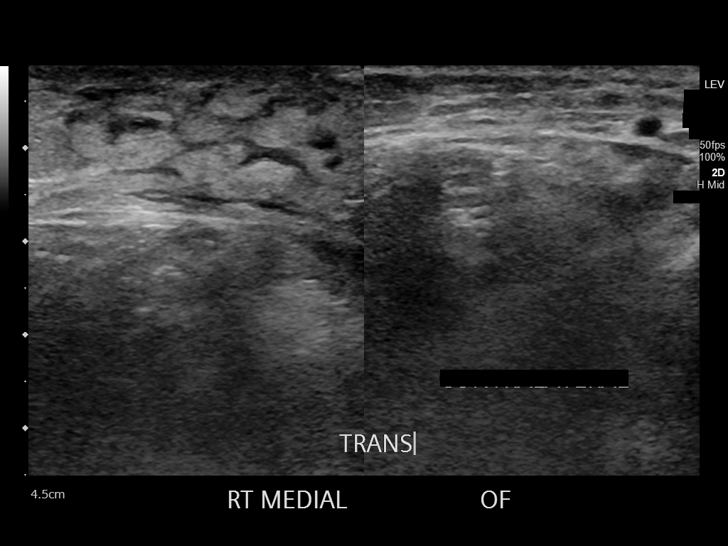

[12 of 12 positions shown; findings below may reference images not displayed]

FINDINGS: There is subcutaneous soft tissue edema with increased echogenicity
of the subcutaneous fat consistent with inflammation. No mass. No
defined fluid collection to suggest an abscess.
IMPRESSION: 1. Right medial calf soft tissue edema/inflammation corresponding to
the indurated/swollen area of the calf noted clinically. No defined
mass and no fluid collection to suggest an abscess.

## 2021-01-21 ENCOUNTER — Emergency Department
Admission: EM | Admit: 2021-01-21 | Discharge: 2021-01-21 | Disposition: A | Discharge status: Home / Self Care | Payer: BC Managed Care – PPO | Attending: Emergency Medicine | Admitting: Emergency Medicine

## 2021-01-21 ENCOUNTER — Emergency Department: Payer: BC Managed Care – PPO

## 2021-01-21 ENCOUNTER — Other Ambulatory Visit: Payer: Self-pay

## 2021-01-21 ENCOUNTER — Encounter: Payer: Self-pay | Admitting: Emergency Medicine

## 2021-01-21 DIAGNOSIS — J189 Pneumonia, unspecified organism: Secondary | ICD-10-CM

## 2021-01-21 DIAGNOSIS — R509 Fever, unspecified: Secondary | ICD-10-CM | POA: Diagnosis present

## 2021-01-21 DIAGNOSIS — I10 Essential (primary) hypertension: Secondary | ICD-10-CM | POA: Diagnosis not present

## 2021-01-21 DIAGNOSIS — J181 Lobar pneumonia, unspecified organism: Secondary | ICD-10-CM | POA: Insufficient documentation

## 2021-01-21 DIAGNOSIS — Z79899 Other long term (current) drug therapy: Secondary | ICD-10-CM | POA: Diagnosis not present

## 2021-01-21 IMAGING — DX DG CHEST 1V PORT
1 series · 1 of 1 positions shown · non-contrast
Comparison: [DATE].

CLINICAL DATA: Cough, fever.

EXAM:
PORTABLE CHEST 1 VIEW

[chest ap]
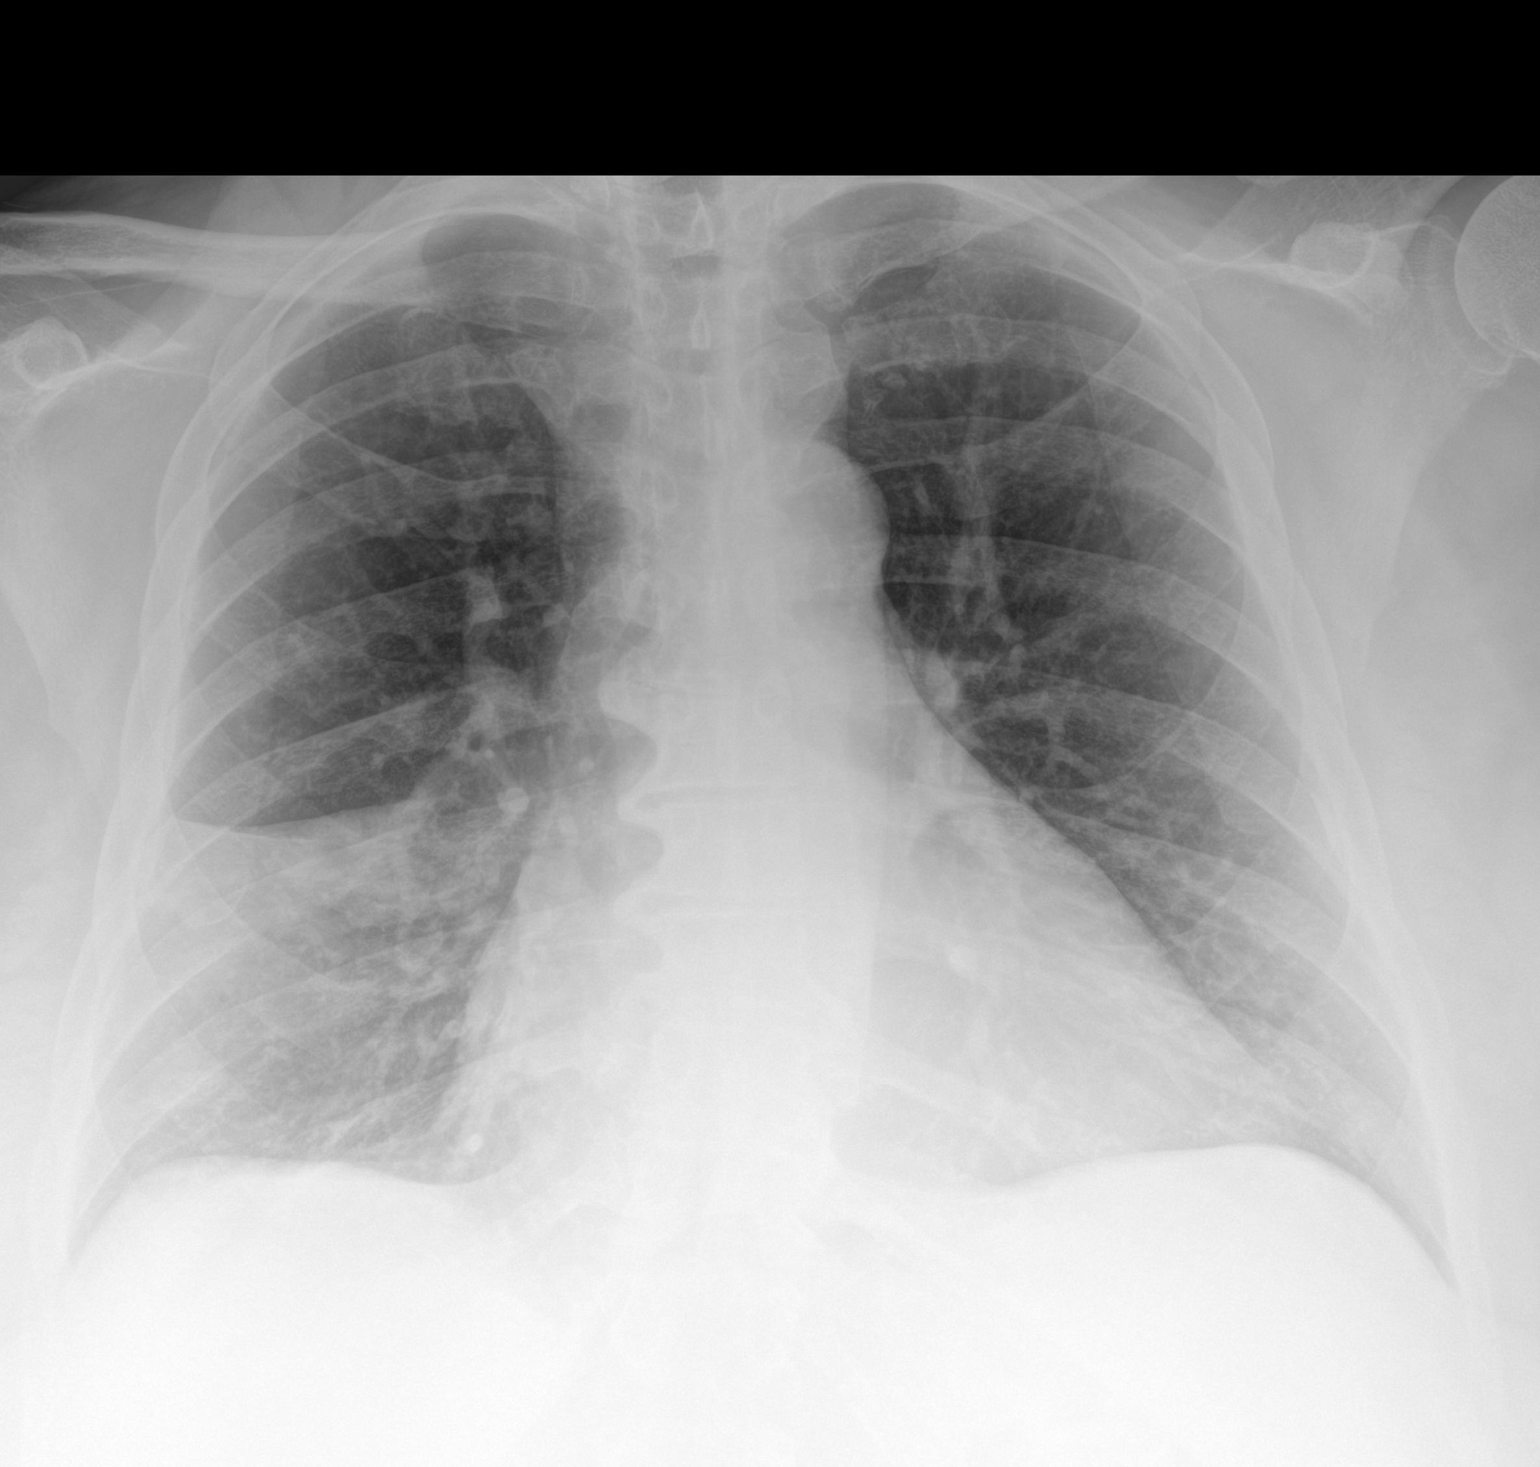

[1 of 1 positions shown; findings below may reference images not displayed]

FINDINGS: The heart size and mediastinal contours are within normal limits.
Left lung is clear. Mild right middle lobe opacity is noted
concerning for possible pneumonia. The visualized skeletal
structures are unremarkable.
IMPRESSION: Right middle lobe opacity is noted concerning for pneumonia.
Followup PA and lateral chest X-ray is recommended in 3-4 weeks
following trial of antibiotic therapy to ensure resolution and
exclude underlying malignancy.

## 2021-01-21 MED ORDER — AZITHROMYCIN 250 MG PO TABS: 250.0000 mg | ORAL_TABLET | Freq: Every day | ORAL | 0 refills | Status: AC

## 2021-01-21 MED ORDER — CEFDINIR 300 MG PO CAPS
300.0000 mg | ORAL_CAPSULE | Freq: Two times a day (BID) | ORAL | Status: DC
  Administered 2021-01-21: 300 mg via ORAL
  Filled 2021-01-21: qty 1

## 2021-01-21 MED ORDER — AZITHROMYCIN 500 MG PO TABS
500.0000 mg | ORAL_TABLET | Freq: Once | ORAL | Status: AC
  Administered 2021-01-21: 500 mg via ORAL
  Filled 2021-01-21: qty 1

## 2021-01-21 MED ORDER — BENZONATATE 100 MG PO CAPS: ORAL_CAPSULE | ORAL | 0 refills | Status: DC

## 2021-01-21 MED ORDER — CEFDINIR 300 MG PO CAPS: 300.0000 mg | ORAL_CAPSULE | Freq: Two times a day (BID) | ORAL | 0 refills | Status: AC

## 2021-01-21 NOTE — ED Triage Notes (Signed)
Pt to ED via POV for fever, sore throat, nausea, and cough. Pt states that she had a negative at home COVID test.

## 2021-01-21 NOTE — Discharge Instructions (Signed)
You are being treated for a pneumonia.  Take the 2 antibiotics as directed, until every single pill is gone! Follow-up with your primary provider for routine re-evaluation and repeat chest x-ray in 3 to 4 weeks.  Return to the emergency department for worsening chest pain, shortness of breath, or fevers.

## 2021-01-21 NOTE — ED Provider Notes (Signed)
[facility] Emergency Department Provider Note  ____________________________________________   Event Date/Time   First MD Initiated Contact with Patient 01/21/21 [phone]     (approximate)  I have reviewed the triage vital signs and the nursing notes.   HISTORY  Chief Complaint Sore Throat and Fever  HPI Sharon Holden is a 60 y.o. female With the below medical history, presents herself to the ED for evaluation of fevers (Tmax 101.71F), sore throat, cough, diarrhea, and nausea.  Symptoms began on Wednesday and presented to [facility] for a COVID test, which was negative by report yesterday. She notes the cough became productive this morning.  She denies any recent sick contacts or other high risk exposures. She has been taking Tylenol and leftover amoxicillin sporadically. She has been vaccinated, but not boosted against Covid. She has not had a flu vaccine.   Past Medical History:  Diagnosis Date  . Arthritis   . History of kidney stones   . Hypertension   . Kidney stones   . Sleep apnea    can't wear cpap    There are no problems to display for this patient.   Past Surgical History:  Procedure Laterality Date  . CESAREAN SECTION     x 2  . CYSTOSCOPY WITH STENT PLACEMENT Left 06/20/2016   Procedure: CYSTOSCOPY WITH STENT PLACEMENT;  Surgeon: Nickie Retort, MD;  Location: [facility] ORS;  Service: Urology;  Laterality: Left;  Marland Kitchen GASTRIC BYPASS    . URETEROSCOPY WITH HOLMIUM LASER LITHOTRIPSY Left 06/20/2016   Procedure: URETEROSCOPY WITH HOLMIUM LASER LITHOTRIPSY;  Surgeon: Nickie Retort, MD;  Location: [facility] ORS;  Service: Urology;  Laterality: Left;    Prior to Admission medications   Medication Sig Start Date End Date Taking? Authorizing Provider  azithromycin (ZITHROMAX Z-PAK) 250 MG tablet Take 1 tablet (250 mg total) by mouth daily for 4 days. 01/22/21 01/26/21 Yes Lunetta Marina, Dannielle Karvonen, PA-C  benzonatate (TESSALON PERLES) 100 MG capsule Take 1-2  tabs TID prn cough 01/21/21  Yes Tmya Wigington, Dannielle Karvonen, PA-C  cefdinir (OMNICEF) 300 MG capsule Take 1 capsule (300 mg total) by mouth 2 (two) times daily for 7 days. 01/22/21 01/29/21 Yes Loriel Diehl, Dannielle Karvonen, PA-C  lisinopril-hydrochlorothiazide (PRINZIDE,ZESTORETIC) 20-12.5 MG per tablet Take 1 tablet by mouth daily.    [provider]  meloxicam (MOBIC) 7.5 MG tablet Take 7.5 mg by mouth daily.    [provider]  pantoprazole (PROTONIX) 20 MG tablet Take 1 tablet (20 mg total) by mouth daily. 02/03/17 02/03/18  Harvest Dark, MD    Allergies Patient has no known allergies.  Family History  Problem Relation Age of Onset  . Bladder Cancer Neg Hx   . Prostate cancer Neg Hx   . Kidney cancer Neg Hx     Social History Social History   Tobacco Use  . Smoking status: Never Smoker  . Smokeless tobacco: Never Used  Substance Use Topics  . Alcohol use: Yes  . Drug use: No    Review of Systems  Constitutional: reports fevers/chills Eyes: No visual changes. ENT: Positive for sore throat. Cardiovascular: Denies chest pain. Respiratory: Denies shortness of breath. Reports productive cough Gastrointestinal: No abdominal pain.  Reports nausea, no vomiting.  No diarrhea.  No constipation. Genitourinary: Negative for dysuria. Musculoskeletal: Negative for back pain. Skin: Negative for rash. Neurological: Negative for headaches, focal weakness or numbness. ___________________________________________   PHYSICAL EXAM:  VITAL SIGNS: ED Triage Vitals  Enc Vitals Group  BP 01/21/21 1359 (!) 151/103     Pulse Rate 01/21/21 1359 100     Resp 01/21/21 1359 16     Temp 01/21/21 1359 99.1 F (37.3 C)     Temp Source 01/21/21 1359 Oral     SpO2 01/21/21 1359 94 %     Weight 01/21/21 1400 (!) 328 lb 7.8 oz (149 kg)     Height 01/21/21 1400 5\' 8"  (1.727 m)     Head Circumference --      Peak Flow --      Pain Score 01/21/21 1400 8     Pain Loc --      Pain  Edu? --      Excl. in [place]? --     Constitutional: Alert and oriented. Well appearing and in no acute distress. Eyes: Conjunctivae are normal. PERRL. EOMI. Head: Atraumatic. Nose: No congestion/rhinnorhea. Mouth/Throat: Mucous membranes are moist.  Oropharynx non-erythematous. Neck: No stridor.   Cardiovascular: Normal rate, regular rhythm. Grossly normal heart sounds.  Good peripheral circulation. Respiratory: Normal respiratory effort.  No retractions. Lungs CTAB. Gastrointestinal: Soft and nontender. No distention. No abdominal bruits. No CVA tenderness. Musculoskeletal: No lower extremity tenderness nor edema.  No joint effusions. Neurologic:  Normal speech and language. No gross focal neurologic deficits are appreciated. No gait instability. Skin:  Skin is warm, dry and intact. No rash noted. Psychiatric: Mood and affect are normal. Speech and behavior are normal.  ____________________________________________   LABS (all labs ordered are listed, but only abnormal results are displayed)  Labs Reviewed - No data to display ____________________________________________  EKG  ____________________________________________  RADIOLOGY I, Melvenia Needles, personally viewed and evaluated these images (plain radiographs) as part of my medical decision making, as well as reviewing the written report by the radiologist.  ED MD interpretation:  Agree with report  Official radiology report(s): DG Chest Portable 1 View  Result Date: 01/21/2021 CLINICAL DATA:  Cough, fever. EXAM: PORTABLE CHEST 1 VIEW COMPARISON:  October 05, 2014. FINDINGS: The heart size and mediastinal contours are within normal limits. Left lung is clear. Mild right middle lobe opacity is noted concerning for possible pneumonia. The visualized skeletal structures are unremarkable. IMPRESSION: Right middle lobe opacity is noted concerning for pneumonia. Followup PA and lateral chest X-ray is recommended in 3-4 weeks  following trial of antibiotic therapy to ensure resolution and exclude underlying malignancy. Electronically Signed   By: Marijo Conception M.D.   On: 01/21/2021 15:55   ____________________________________________   PROCEDURES  Procedure(s) performed (including Critical Care):  Procedures  Azithromycin 5 mg p.o. Omnicef 300 mg p.o. ____________________________________________   INITIAL IMPRESSION / ASSESSMENT AND PLAN / ED COURSE  As part of my medical decision making, I reviewed the following data within the [place] reviewed as noted and Notes from prior ED visits  DDX: CAP, bronchitis, viral URI  Patient with ED evaluation of intermittent fevers and cough.  Patient was evaluated for complaint in the ED, and found to have a right middle lobe pneumonia.  She will be started empirically on azithromycin and cefdinir at this time. P.o. also provided for cough relief.  She will follow-up with primary provider return to the ED for any worsening symptoms as discussed. ____________________________________________   FINAL CLINICAL IMPRESSION(S) / ED DIAGNOSES  Final diagnoses:  Community acquired pneumonia of right middle lobe of lung     ED Discharge Orders         Ordered  azithromycin (ZITHROMAX Z-PAK) 250 MG tablet  Daily        01/21/21 1617    cefdinir (OMNICEF) 300 MG capsule  2 times daily        01/21/21 1617    benzonatate (TESSALON PERLES) 100 MG capsule        01/21/21 1617          *Please note:  Razia Screws Suderman was evaluated in Emergency Department on 01/22/2021 for the symptoms described in the history of present illness. She was evaluated in the context of the global COVID-19 pandemic, which necessitated consideration that the patient might be at risk for infection with the SARS-CoV-2 virus that causes COVID-19. Institutional protocols and algorithms that pertain to the evaluation of patients at risk for COVID-19 are in a state of  rapid change based on information released by regulatory bodies including the CDC and federal and state organizations. These policies and algorithms were followed during the patient's care in the ED.  Some ED evaluations and interventions may be delayed as a result of limited staffing during and the pandemic.*   Note:  This document was prepared using Dragon voice recognition software and may include unintentional dictation errors.    Melvenia Needles, PA-C 01/22/21 0016    Nance Pear, MD 01/29/21 [phone]

## 2021-01-21 NOTE — ED Notes (Signed)
Pt has been provided with discharge instructions. Pt denies any questions or concerns at this time. Pt verbalizes understanding for follow up care and d/c.  VSS.  Pt left department with all belongings.  

## 2021-03-12 NOTE — Progress Notes (Signed)
Patient: Sharon Holden  Service Category: E/M  Provider: Gaspar Cola, MD  DOB: 06-06-1961  DOS: 03/14/2021  Referring Provider: Marguerita Merles, MD  MRN: [id number]  Setting: Ambulatory outpatient  PCP: Marguerita Merles, MD  Type: New Patient  Specialty: Interventional Pain Management    Location: Office  Delivery: Face-to-face     Primary Reason(s) for Visit: Encounter for initial evaluation of one or more chronic problems (new to examiner) potentially causing chronic pain, and posing a threat to normal musculoskeletal function. (Level of risk: High) CC: Back Pain (lower)  HPI  Ms. Boulais is a 60 y.o. year old, female patient, who comes for the first time to our practice referred by Marguerita Merles, MD for our initial evaluation of her chronic pain. She has B12 deficiency; Chronic fatigue; Elevated PTHrP level; Hypertension; Morbid obesity with BMI of 50.0-59.9, adult ([place]); Obstructive sleep apnea; Status post bariatric surgery; Vitamin A deficiency; Vitamin D deficiency; Chronic pain syndrome; Pharmacologic therapy; Disorder of skeletal system; Problems influencing health status; Chronic knee pain (Bilateral) (R>L); DDD (degenerative disc disease), thoracic; DDD (degenerative disc disease), lumbosacral; Chronic lower extremity pain (Bilateral) (R>L); Chronic low back pain (1ry area of Pain) (Bilateral) (R>L) w/o sciatica; and Long term current use of non-steroidal anti-inflammatories (NSAID) on their problem list. Today she comes in for evaluation of her Back Pain (lower)  Pain Assessment: Location: Lower Back Radiating: occasionally down back of right upper leg Onset: More than a month ago Duration: Chronic pain Quality: Sharp Severity: 5 /10 (subjective, self-reported pain score)  Effect on ADL:   Timing: Intermittent Modifying factors: sitting BP: (!) 158/95  HR: 77  Onset and Duration: Gradual and Present longer than 3 months10 years Cause of pain: Unknown Severity: Getting  worse, NAS-11 at its worse: 10/10, NAS-11 at its best: 5/10, NAS-11 now: 5/10, and NAS-11 on the average: 5/10 Timing: Not influenced by the time of the day and During activity or exercise Aggravating Factors: Bending, Motion, Prolonged standing, Walking, Walking uphill, and Walking downhill Alleviating Factors: Lying down, Resting, and Sitting Associated Problems: Inability to control bladder (urine), Spasms, and Swelling Quality of Pain: Aching Previous Examinations or Tests: The patient denies none listed Previous Treatments: The patient denies none listed  According to the patient the primary area of pain is that of the lower back (Bilateral) (R>L) she denies any prior surgeries, physical therapy, nerve blocks, or recent x-rays.  The patient's secondary area pain is that of the lower extremities (Bilateral) (R>L).  Currently she is having pain only on the right lower extremity and this pain runs through the back of the leg down to the knee.  She denies any numbness or weakness.  No pain below the knee.  Her pain pattern appears to be more compatible with referred pain from the lumbar region than a radiculopathy.  The patient denies any surgeries, nerve blocks, or physical therapy for the lower extremities and she also denies any recent x-rays.  The patient's third area pain is that of the knees (Bilateral) (R>L).  She denies any knee surgeries, physical therapy, but she does admit to having had 1 injection into the right knee that was done by a physician at the [facility] and it did provide her with some relief of the pain for approximately 1 month.  She denies any recent x-rays of the knees.  The patient's fourth area of pain is that of her right hand.  She indicates that she has had pain in  that hand for the past 4 months with some swelling that seems to affect all of the fingers except for her thumb.  In addition to the above, the patient has a history that is significant for severe morbid  obesity with a BMI of 54.03 kg/m.  She had a bariatric surgery on 2012 and she indicated that she lost 100 pounds which later she regained.  She also indicates having urinary and occasional fecal incontinence for many years.  Today I took the time to provide the patient with information regarding my pain practice. The patient was informed that my practice is divided into two sections: an interventional pain management section, as well as a completely separate and distinct medication management section. I explained that I have procedure days for my interventional therapies, and evaluation days for follow-ups and medication management. Because of the amount of documentation required during both, they are kept separated. This means that there is the possibility that she may be scheduled for a procedure on one day, and medication management the next. I have also informed her that because of staffing and facility limitations, I no longer take patients for medication management only. To illustrate the reasons for this, I gave the patient the example of surgeons, and how inappropriate it would be to refer a patient to his/her care, just to write for the post-surgical antibiotics on a surgery done by a different surgeon.   Because interventional pain management is my board-certified specialty, the patient was informed that joining my practice means that they are open to any and all interventional therapies. I made it clear that this does not mean that they will be forced to have any procedures done. What this means is that I believe interventional therapies to be essential part of the diagnosis and proper management of chronic pain conditions. Therefore, patients not interested in these interventional alternatives will be better served under the care of a different practitioner.  The patient was also made aware of my Comprehensive Pain Management Safety Guidelines where by joining my practice, they limit all of their  nerve blocks and joint injections to those done by our practice, for as long as we are retained to manage their care.   Historic Controlled Substance Pharmacotherapy Review  PMP and historical list of controlled substances: None Current opioid analgesics: None MME/day: 0 mg/day  Historical Monitoring: The patient  reports no history of drug use. List of all UDS Test(s): No results found for: MDMA, COCAINSCRNUR, [place], [place], CANNABQUANT, THCU, [place] List of other Serum/Urine Drug Screening Test(s):  No results found for: AMPHSCRSER, BARBSCRSER, BENZOSCRSER, COCAINSCRSER, COCAINSCRNUR, PCPSCRSER, PCPQUANT, THCSCRSER, THCU, CANNABQUANT, OPIATESCRSER, OXYSCRSER, PROPOXSCRSER, ETH Historical Background Evaluation: [place] PMP: PDMP reviewed during this encounter. Online review of the past 23-monthperiod conducted.             PMP NARX Score Report:  Narcotic: 000 Sedative: 000 Stimulant: 000  Department of public safety, offender search: (Editor, commissioningInformation) Non-contributory Risk Assessment Profile: Aberrant behavior: None observed or detected today Risk factors for fatal opioid overdose: None identified today PMP NARX Overdose Risk Score: 000 Fatal overdose hazard ratio (HR): Calculation deferred Non-fatal overdose hazard ratio (HR): Calculation deferred Risk of opioid abuse or dependence: 0.7-3.0% with doses ? 36 MME/day and 6.1-26% with doses ? 120 MME/day. Substance use disorder (SUD) risk level: See below Personal History of Substance Abuse (SUD-Substance use disorder):  Alcohol: Negative  Illegal Drugs: Negative  Rx Drugs: Negative  ORT Risk Level calculation: Moderate Risk  Opioid Risk Tool - 03/14/21 1313       Family History of Substance Abuse   Alcohol Positive Female    Illegal Drugs Positive Female    Rx Drugs Positive Female or Female      Personal History of Substance Abuse   Alcohol Negative    Illegal Drugs Negative    Rx Drugs Negative      Age   Age  between 56-45 years  No      History of Preadolescent Sexual Abuse   History of Preadolescent Sexual Abuse Negative or Female      Psychological Disease   Psychological Disease Negative    Depression Negative      Total Score   Opioid Risk Tool Scoring 7    Opioid Risk Interpretation Moderate Risk            ORT Scoring interpretation table:  Score <3 = Low Risk for SUD  Score between 4-7 = Moderate Risk for SUD  Score >8 = High Risk for Opioid Abuse   PHQ-2 Depression Scale:  Total score: 0  PHQ-2 Scoring interpretation table: (Score and probability of major depressive disorder)  Score 0 = No depression  Score 1 = 15.4% Probability  Score 2 = 21.1% Probability  Score 3 = 38.4% Probability  Score 4 = 45.5% Probability  Score 5 = 56.4% Probability  Score 6 = 78.6% Probability   PHQ-9 Depression Scale:  Total score: 0  PHQ-9 Scoring interpretation table:  Score 0-4 = No depression  Score 5-9 = Mild depression  Score 10-14 = Moderate depression  Score 15-19 = Moderately severe depression  Score 20-27 = Severe depression (2.4 times higher risk of SUD and 2.89 times higher risk of overuse)   Pharmacologic Plan: As per protocol, I have not taken over any controlled substance management, pending the results of ordered tests and/or consults.            Initial impression: Pending review of available data and ordered tests.  Meds   Current Outpatient Medications:    etodolac (LODINE) 400 MG tablet, Take 400 mg by mouth 2 (two) times daily., Disp: , Rfl:    furosemide (LASIX) 20 MG tablet, 1 BY MOUTH EVERY MORNING FOR FLUID RETENTION AND HIGH BLOOD PRESSURE, Disp: , Rfl:    lisinopril-hydrochlorothiazide (PRINZIDE,ZESTORETIC) 20-12.5 MG per tablet, Take 1 tablet by mouth daily., Disp: , Rfl:    pantoprazole (PROTONIX) 20 MG tablet, Take 1 tablet (20 mg total) by mouth daily., Disp: 30 tablet, Rfl: 1  Imaging Review  Knee Imaging: Knee-R DG 4 views: Results for orders  placed during the hospital encounter of 12/25/20 DG Knee Complete 4 Views Right  Narrative CLINICAL DATA:  Right knee pain over the last several months with difficulty walking.  EXAM: RIGHT KNEE - COMPLETE 4+ VIEW  COMPARISON:  None.  FINDINGS: Osteoarthritis of the knee, most pronounced in the medial compartment with near complete loss of joint height and marginal osteophytes. Considerable degenerative change also at the patellofemoral joint. Lateral compartment is better preserved. There is only a tiny amount of joint fluid. No focal bone lesion.  IMPRESSION: Osteoarthritis of the knee, most pronounced in the medial compartment. Patellofemoral osteoarthritis also present. Small joint effusion.   Electronically Signed By: Nelson Chimes M.D. On: 12/26/2020 14:33  Foot Imaging: Foot-L DG Complete: Results for orders placed in visit on 04/07/14 DG Foot Complete Left  Narrative 2 views of the skeletally mature left foot. The study includes  a lateral as well as a calcaneal axial projection.  In the lateral projection there is a small retrocalcaneal exostosis as well as a plantar calcaneal heel spur. There is a small Haglund's deformity noted. No acute fractures noted. Small areas of calcification within the soft tissues on the anterior aspect of the leg.  On the calcaneal axial projection no acute fracture.  Complexity Note: Imaging results reviewed. Results shared with Ms. Borelli, using Layman's terms.                        ROS  Cardiovascular: High blood pressure Pulmonary or Respiratory: Snoring  and Temporary stoppage of breathing during sleep Neurological: Incontinence:  Urinary and Fecal Psychological-Psychiatric: No reported psychological or psychiatric signs or symptoms such as difficulty sleeping, anxiety, depression, delusions or hallucinations (schizophrenial), mood swings (bipolar disorders) or suicidal ideations or attempts Gastrointestinal: Irregular,  infrequent bowel movements (Constipation) Genitourinary: Passing kidney stones and Recurrent Urinary Tract infections Hematological: No reported hematological signs or symptoms such as prolonged bleeding, low or poor functioning platelets, bruising or bleeding easily, hereditary bleeding problems, low energy levels due to low hemoglobin or being anemic Endocrine: No reported endocrine signs or symptoms such as high or low blood sugar, rapid heart rate due to high thyroid levels, obesity or weight gain due to slow thyroid or thyroid disease Rheumatologic: Rheumatoid arthritis Musculoskeletal: Negative for myasthenia gravis, muscular dystrophy, multiple sclerosis or malignant hyperthermia Work History: Working full time  Allergies  Ms. Nilsson has No Known Allergies.  Laboratory Chemistry Profile   Renal Lab Results  Component Value Date   BUN 12 02/03/2017   CREATININE 0.90 02/03/2017   GFRAA >60 02/03/2017   GFRNONAA >60 02/03/2017   SPECGRAV 1.020 06/26/2016   PHUR 7.0 06/26/2016   PROTEINUR NEGATIVE 02/03/2017     Electrolytes Lab Results  Component Value Date   NA 139 02/03/2017   K 3.9 02/03/2017   CL 105 02/03/2017   CALCIUM 9.2 02/03/2017     Hepatic Lab Results  Component Value Date   AST 17 02/03/2017   ALT 11 (L) 02/03/2017   ALBUMIN 4.2 02/03/2017   ALKPHOS 99 02/03/2017   LIPASE 30 02/03/2017     ID Lab Results  Component Value Date   PREGTESTUR NEGATIVE 06/13/2016     Bone No results found for: VD25OH, VD125OH2TOT, NG2952WU1, LK4401UU7, 25OHVITD1, 25OHVITD2, 25OHVITD3, TESTOFREE, TESTOSTERONE   Endocrine Lab Results  Component Value Date   GLUCOSE 117 (H) 02/03/2017   GLUCOSEU NEGATIVE 02/03/2017     Neuropathy No results found for: VITAMINB12, FOLATE, HGBA1C, HIV   CNS No results found for: COLORCSF, APPEARCSF, RBCCOUNTCSF, WBCCSF, POLYSCSF, LYMPHSCSF, EOSCSF, PROTEINCSF, GLUCCSF, JCVIRUS, CSFOLI, IGGCSF, LABACHR, ACETBL, LABACHR, ACETBL    Inflammation (CRP: Acute  ESR: Chronic) No results found for: CRP, ESRSEDRATE, LATICACIDVEN   Rheumatology No results found for: RF, ANA, LABURIC, URICUR, LYMEIGGIGMAB, LYMEABIGMQN, HLAB27   Coagulation Lab Results  Component Value Date   PLT 192 02/03/2017   DDIMER  02/10/2007    0.47        AT THE INHOUSE ESTABLISHED CUTOFF VALUE OF 0.48 ug/mL FEU, THIS ASSAY HAS BEEN DOCUMENTED IN THE LITERATURE TO HAVE     Cardiovascular Lab Results  Component Value Date   HGB 13.5 02/03/2017   HCT 40.2 02/03/2017     Screening Lab Results  Component Value Date   PREGTESTUR NEGATIVE 06/13/2016     Cancer No results found for: CEA, CA125, LABCA2  Allergens No results found for: ALMOND, APPLE, ASPARAGUS, AVOCADO, BANANA, BARLEY, BASIL, BAYLEAF, GREENBEAN, LIMABEAN, WHITEBEAN, BEEFIGE, REDBEET, BLUEBERRY, BROCCOLI, CABBAGE, MELON, CARROT, CASEIN, CASHEWNUT, CAULIFLOWER, CELERY     Note: Lab results reviewed.  PFSH  Drug: Ms. Shadden  reports no history of drug use. Alcohol:  reports current alcohol use. Tobacco:  reports that she has never smoked. She has never used smokeless tobacco. Medical:  has a past medical history of Arthritis, History of kidney stones, Hypertension, Kidney stones, and Sleep apnea. Family: family history is not on file.  Past Surgical History:  Procedure Laterality Date   CESAREAN SECTION     x 2   CYSTOSCOPY WITH STENT PLACEMENT Left 06/20/2016   Procedure: CYSTOSCOPY WITH STENT PLACEMENT;  Surgeon: Nickie Retort, MD;  Location: [facility];  Service: Urology;  Laterality: Left;   GASTRIC BYPASS     URETEROSCOPY WITH HOLMIUM LASER LITHOTRIPSY Left 06/20/2016   Procedure: URETEROSCOPY WITH HOLMIUM LASER LITHOTRIPSY;  Surgeon: Nickie Retort, MD;  Location: [facility];  Service: Urology;  Laterality: Left;   Active Ambulatory Problems    Diagnosis Date Noted   B12 deficiency 06/12/2017   Chronic fatigue 06/06/2017   Elevated PTHrP level  06/12/2017   Hypertension 06/06/2017   Morbid obesity with BMI of 50.0-59.9, adult ([place]) 06/06/2017   Obstructive sleep apnea 06/06/2017   Status post bariatric surgery 06/06/2017   Vitamin A deficiency 06/12/2017   Vitamin D deficiency 06/12/2017   Chronic pain syndrome 03/13/2021   Pharmacologic therapy 03/13/2021   Disorder of skeletal system 03/13/2021   Problems influencing health status 03/13/2021   Chronic knee pain (Bilateral) (R>L) 03/13/2021   DDD (degenerative disc disease), 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   DDD (degenerative disc disease), lumbosacral 03/14/2021   Chronic lower extremity pain (Bilateral) (R>L) 03/14/2021   Chronic low back pain (1ry area of Pain) (Bilateral) (R>L) w/o sciatica 03/14/2021   Long term current use of non-steroidal anti-inflammatories (NSAID) 03/14/2021   Resolved Ambulatory Problems    Diagnosis Date Noted   No Resolved Ambulatory Problems   Past Medical History:  Diagnosis Date   Arthritis    History of kidney stones    Kidney stones    Sleep apnea    Constitutional Exam  General appearance: Well nourished, well developed, and well hydrated. In no apparent acute distress Vitals:   03/14/21 1308  BP: (!) 158/95  Pulse: 77  Resp: 16  Temp: (!) 96.9 F (36.1 C)  TempSrc: Temporal  SpO2: 100%  Weight: (!) 345 lb (156.5 kg)  Height: _0  (1.702 m)   BMI Assessment: Estimated body mass index is 54.03 kg/m as calculated from the following:   Height as of this encounter: _1  (1.702 m).   Weight as of this encounter: 345 lb (156.5 kg).  BMI interpretation table: BMI level Category Range association with higher incidence of chronic pain  <18 kg/m2 Underweight   18.5-24.9 kg/m2 Ideal body weight   25-29.9 kg/m2 Overweight Increased incidence by 20%  30-34.9 kg/m2 Obese (Class I) Increased incidence by 68%  35-39.9 kg/m2 Severe obesity (Class II) Increased incidence by 136%  >40 kg/m2 Extreme obesity (Class III) Increased incidence by 254%    Patient's current BMI Ideal Body weight  Body mass index is 54.03 kg/m. Ideal body weight: 61.6 kg (135 lb 12.9 oz) Adjusted ideal body weight: 99.6 kg (219 lb 7.7 oz)   BMI Readings from Last 4 Encounters:  03/14/21 54.03 kg/m  01/21/21 49.95 kg/m  02/03/17 50.02 kg/m  06/26/16 50.13 kg/m   Wt Readings from Last 4 Encounters:  03/14/21 (!) 345 lb (156.5 kg)  01/21/21 (!) 328 lb 7.8 oz (149 kg)  02/03/17 (!) 329 lb (149.2 kg)  06/26/16 (!) 329 lb 11.2 oz (149.6 kg)    Psych/Mental status: Alert, oriented x 3 (person, place, & time)       Eyes: PERLA Respiratory: No evidence of acute respiratory distress  Assessment  Primary Diagnosis & Pertinent Problem List: The primary encounter diagnosis was Chronic pain syndrome. Diagnoses of Chronic low back pain (1ry area of Pain) (Bilateral) (R>L) w/o sciatica, Chronic lower extremity pain (Bilateral) (R>L), Chronic knee pain (Bilateral) (R>L), DDD (degenerative disc disease), lumbosacral, DDD (degenerative disc disease), thoracic, B12 deficiency, Vitamin D deficiency, Pharmacologic therapy, Long term current use of non-steroidal anti-inflammatories (NSAID), Disorder of skeletal system, Problems influencing health status, and Morbid obesity with BMI of 50.0-59.9, adult ([place]) were also pertinent to this visit.  Visit Diagnosis (New problems to examiner): 1. Chronic pain syndrome   2. Chronic low back pain (1ry area of Pain) (Bilateral) (R>L) w/o sciatica   3. Chronic lower extremity pain (Bilateral) (R>L)   4. Chronic knee pain (Bilateral) (R>L)   5. DDD (degenerative disc disease), lumbosacral   6. DDD (degenerative disc disease), thoracic   7. B12 deficiency   8. Vitamin D deficiency   9. Pharmacologic therapy   10. Long term current use of non-steroidal anti-inflammatories (NSAID)   11. Disorder of skeletal system   12. Problems influencing health status   13. Morbid obesity with BMI of 50.0-59.9, adult ([place])    Plan of Care  (Initial workup plan)  Note: Ms. Ilg was reminded that as per protocol, today's visit has been an evaluation only. We have not taken over the patient's controlled substance management.  Problem-specific plan: No problem-specific Assessment & Plan notes found for this encounter. Lab Orders  Compliance Drug Analysis, Ur  Comp. Metabolic Panel (12)  Magnesium  Vitamin B12  Sedimentation rate  25-Hydroxy vitamin D Lcms D2+D3  C-reactive protein   Imaging Orders  DG Lumbar Spine Complete W/Bend  DG Knee Complete 4 Views Left  DG Knee Complete 4 Views Right   Referral Orders  No referral(s) requested today   Procedure Orders    No procedure(s) ordered today   Pharmacotherapy (current): Medications ordered:  No orders of the defined types were placed in this encounter.  Medications administered during this visit: Caryl Fate. Weida had no medications administered during this visit.   Pharmacological management options:  Opioid Analgesics: The patient was informed that there is no guarantee that she would be a candidate for opioid analgesics. The decision will be made following CDC guidelines. This decision will be based on the results of diagnostic studies, as well as Ms. Stroupe's risk profile.   Membrane stabilizer: To be determined at a later time  Muscle relaxant: To be determined at a later time  NSAID: To be determined at a later time  Other analgesic(s): To be determined at a later time   Interventional management options: Ms. Ju was informed that there is no guarantee that she would be a candidate for interventional therapies. The decision will be based on the results of diagnostic studies, as well as Ms. Calef's risk profile.  Procedure(s) under consideration:  Pending results of ordered studies   Interventional Therapies  Risk  Complexity Considerations:   Estimated body mass index is 54.03 kg/m as calculated from the following:   Height as of  this encounter:  _0  (1.702 m).   Weight as of this encounter: 345 lb (156.5 kg). WNL   Planned  Pending:   Pending further evaluation   Under consideration:   Diagnostic bilateral lumbar facet block    Completed:   None at this time   Therapeutic  Palliative (PRN) options:   None established   Provider-requested follow-up: Return for evaluation day (40 min) 2nd Visit "Plan of Care".  No future appointments.   Note by: Gaspar Cola, MD Date: 03/14/2021; Time: 1:57 PM

## 2021-03-13 DIAGNOSIS — Z79899 Other long term (current) drug therapy: Secondary | ICD-10-CM | POA: Insufficient documentation

## 2021-03-13 DIAGNOSIS — M25562 Pain in left knee: Secondary | ICD-10-CM | POA: Insufficient documentation

## 2021-03-13 DIAGNOSIS — G8929 Other chronic pain: Secondary | ICD-10-CM | POA: Insufficient documentation

## 2021-03-13 DIAGNOSIS — M899 Disorder of bone, unspecified: Secondary | ICD-10-CM | POA: Insufficient documentation

## 2021-03-13 DIAGNOSIS — Z789 Other specified health status: Secondary | ICD-10-CM | POA: Insufficient documentation

## 2021-03-13 DIAGNOSIS — G894 Chronic pain syndrome: Secondary | ICD-10-CM | POA: Insufficient documentation

## 2021-03-13 NOTE — Patient Instructions (Signed)
______________________________________________________________________________________________  Body mass index (BMI)  Body mass index (BMI) is a common tool for deciding whether a person has an appropriate body weight.  It measures a persons weight in relation to their height.   According to the [facility] of health (NIH): A BMI of less than 18.5 means that a person is underweight. A BMI of between 18.5 and 24.9 is ideal. A BMI of between 25 and 29.9 is overweight. A BMI over 30 indicates obesity.  Weight Management Required  URGENT: Your weight has been found to be adversely affecting your health.  Dear Sharon Holden:  Your current Estimated body mass index is 49.95 kg/m as calculated from the following:   Height as of 01/21/21: _0  (1.727 m).   Weight as of 01/21/21: 328 lb 7.8 oz (149 kg).  Please use the table below to identify your weight category and associated incidence of chronic pain, secondary to your weight.  Body Mass Index (BMI) Classification BMI level (kg/m2) Category Associated incidence of chronic pain  <18  Underweight   18.5-24.9 Ideal body weight   25-29.9 Overweight  20%  30-34.9 Obese (Class I)  68%  35-39.9 Severe obesity (Class II)  136%  >40 Extreme obesity (Class III)  254%   In addition: You will be considered "Morbidly Obese", if your BMI is above 30 and you have one or more of the following conditions which are known to be caused and/or directly associated with obesity: 1.    Type 2 Diabetes (Which in turn can lead to cardiovascular diseases (CVD), stroke, peripheral vascular diseases (PVD), retinopathy, nephropathy, and neuropathy) 2.    Cardiovascular Disease (High Blood Pressure; Congestive Heart Failure; High Cholesterol; Coronary Artery Disease; Angina; or History of Heart Attacks) 3.    Breathing problems (Asthma; obesity-hypoventilation syndrome; obstructive sleep apnea; chronic inflammatory airway disease; reactive airway disease; or  shortness of breath) 4.    Chronic kidney disease 5.    Liver disease (nonalcoholic fatty liver disease) 6.    High blood pressure 7.    Acid reflux (gastroesophageal reflux disease; heartburn) 8.    Osteoarthritis (OA) (with any of the following: hip pain; knee pain; and/or low back pain) 9.    Low back pain (Lumbar Facet Syndrome; and/or Degenerative Disc Disease) 10.  Hip pain (Osteoarthritis of hip) (For every 1 lbs of added body weight, there is a 2 lbs increase in pressure inside of each hip articulation. 1:2 mechanical relationship) 11.  Knee pain (Osteoarthritis of knee) (For every 1 lbs of added body weight, there is a 4 lbs increase in pressure inside of each knee articulation. 1:4 mechanical relationship) (patients with a BMI>30 kg/m2 were 6.8 times more likely to develop knee OA than normal-weight individuals) 12.  Cancer: Epidemiological studies have shown that obesity is a risk factor for: post-menopausal breast cancer; cancers of the endometrium, colon and kidney cancer; malignant adenomas of the oesophagus. Obese subjects have an approximately 1.5-3.5-fold increased risk of developing these cancers compared with normal-weight subjects, and it has been estimated that between 15 and 45% of these cancers can be attributed to overweight. More recent studies suggest that obesity may also increase the risk of other types of cancer, including pancreatic, hepatic and gallbladder cancer. (Ref: Obesity and cancer. Pischon T, Nthlings U, [employer] H. Proc Nutr Soc. 2008 May;67(2):128-45. doi: 54.6568/[id number].) The International Agency for Research on Cancer (IARC) has identified 13 cancers associated with overweight and obesity: meningioma, multiple myeloma, adenocarcinoma of the esophagus, and cancers of  the thyroid, postmenopausal breast cancer, gallbladder, stomach, liver, pancreas, kidney, ovaries, uterus, colon and rectal (colorectal) cancers. 34 percent of all cancers diagnosed in women  and 24 percent of those diagnosed in men are associated with overweight and obesity.  Recommendation: At this point it is urgent that you take a step back and concentrate in loosing weight. Dedicate 100% of your efforts on this task. Nothing else will improve your health more than bringing your weight down and your BMI to less than 30. If you are here, you probably have chronic pain. We know that most chronic pain patients have difficulty exercising secondary to their pain. For this reason, you must rely on proper nutrition and diet in order to lose the weight. If your BMI is above 40, you should seriously consider bariatric surgery. A realistic goal is to lose 10% of your body weight over a period of 12 months.  Be honest to yourself, if over time you have unsuccessfully tried to lose weight, then it is time for you to seek professional help and to enter a medically supervised weight management program, and/or undergo bariatric surgery. Stop procrastinating.   Pain management considerations:  1.    Pharmacological Problems: Be advised that the use of opioid analgesics (oxycodone; hydrocodone; morphine; methadone; codeine; and all of their derivatives) have been associated with decreased metabolism and weight gain.  For this reason, should we see that you are unable to lose weight while taking these medications, it may become necessary for [place] to taper down and indefinitely discontinue them.  2.    Technical Problems: The incidence of successful interventional therapies decreases as the patient's BMI increases. It is much more difficult to accomplish a safe and effective interventional therapy on a patient with a BMI above 35. 3.    Radiation Exposure Problems: The x-rays machine, used to accomplish injection therapies, will automatically increase their x-ray output in order to capture an appropriate bone image. This means that radiation exposure increases exponentially with the patient's BMI. (The higher the  BMI, the higher the radiation exposure.) Although the level of radiation used at a given time is still safe to the patient, it is not for the physician and/or assisting staff. Unfortunately, radiation exposure is accumulative. Because physicians and the staff have to do procedures and be exposed on a daily basis, this can result in health problems such as cancer and radiation burns. Radiation exposure to the staff is monitored by the radiation batches that they wear. The exposure levels are reported back to the staff on a quarterly basis. Depending on levels of exposure, physicians and staff may be obligated by law to decrease this exposure. This means that they have the right and obligation to refuse providing therapies where they may be overexposed to radiation. For this reason, physicians may decline to offer therapies such as radiofrequency ablation or implants to patients with a BMI above 40. 4.    Current Trends: Be advised that the current trend is to no longer offer certain therapies to patients with a BMI equal to, or above 35, due to increase perioperative risks, increased technical procedural difficulties, and excessive radiation exposure to healthcare personnel.  ______________________________________________________________________________________________

## 2021-03-14 ENCOUNTER — Other Ambulatory Visit: Payer: Self-pay

## 2021-03-14 ENCOUNTER — Other Ambulatory Visit: Payer: Self-pay | Admitting: Pain Medicine

## 2021-03-14 ENCOUNTER — Ambulatory Visit: Payer: BC Managed Care – PPO | Attending: Pain Medicine | Admitting: Pain Medicine

## 2021-03-14 ENCOUNTER — Other Ambulatory Visit
Admission: RE | Admit: 2021-03-14 | Discharge: 2021-03-14 | Disposition: A | Discharge status: Home / Self Care | Payer: BC Managed Care – PPO | Attending: Pain Medicine | Admitting: Pain Medicine

## 2021-03-14 VITALS — BP 158/95 | HR 77 | Temp 96.9°F | Resp 16 | Ht 67.0 in | Wt 345.0 lb

## 2021-03-14 DIAGNOSIS — G894 Chronic pain syndrome: Secondary | ICD-10-CM

## 2021-03-14 DIAGNOSIS — M545 Low back pain, unspecified: Secondary | ICD-10-CM

## 2021-03-14 DIAGNOSIS — M25561 Pain in right knee: Secondary | ICD-10-CM | POA: Diagnosis not present

## 2021-03-14 DIAGNOSIS — Z791 Long term (current) use of non-steroidal anti-inflammatories (NSAID): Secondary | ICD-10-CM | POA: Insufficient documentation

## 2021-03-14 DIAGNOSIS — G8929 Other chronic pain: Secondary | ICD-10-CM | POA: Insufficient documentation

## 2021-03-14 DIAGNOSIS — Z79899 Other long term (current) drug therapy: Secondary | ICD-10-CM | POA: Insufficient documentation

## 2021-03-14 DIAGNOSIS — E538 Deficiency of other specified B group vitamins: Secondary | ICD-10-CM | POA: Diagnosis not present

## 2021-03-14 DIAGNOSIS — M79604 Pain in right leg: Secondary | ICD-10-CM | POA: Diagnosis not present

## 2021-03-14 DIAGNOSIS — Z789 Other specified health status: Secondary | ICD-10-CM | POA: Diagnosis not present

## 2021-03-14 DIAGNOSIS — M25562 Pain in left knee: Secondary | ICD-10-CM

## 2021-03-14 DIAGNOSIS — M79605 Pain in left leg: Secondary | ICD-10-CM

## 2021-03-14 DIAGNOSIS — E559 Vitamin D deficiency, unspecified: Secondary | ICD-10-CM | POA: Diagnosis not present

## 2021-03-14 DIAGNOSIS — Z6841 Body Mass Index (BMI) 40.0 and over, adult: Secondary | ICD-10-CM

## 2021-03-14 DIAGNOSIS — M5137 Other intervertebral disc degeneration, lumbosacral region: Secondary | ICD-10-CM

## 2021-03-14 DIAGNOSIS — M899 Disorder of bone, unspecified: Secondary | ICD-10-CM

## 2021-03-14 DIAGNOSIS — M5134 Other intervertebral disc degeneration, thoracic region: Secondary | ICD-10-CM

## 2021-03-14 DIAGNOSIS — M1711 Unilateral primary osteoarthritis, right knee: Secondary | ICD-10-CM | POA: Insufficient documentation

## 2021-03-14 LAB — MAGNESIUM: Magnesium: 1.9 mg/dL (ref 1.7–2.4)

## 2021-03-14 LAB — COMPREHENSIVE METABOLIC PANEL
ALT: 13 U/L (ref 0–44)
AST: 13 U/L — ABNORMAL LOW (ref 15–41)
Albumin: 4.2 g/dL (ref 3.5–5.0)
Alkaline Phosphatase: 95 U/L (ref 38–126)
Anion gap: 8 (ref 5–15)
BUN: 12 mg/dL (ref 6–20)
CO2: 27 mmol/L (ref 22–32)
Calcium: 9.3 mg/dL (ref 8.9–10.3)
Chloride: 104 mmol/L (ref 98–111)
Creatinine, Ser: 0.98 mg/dL (ref 0.44–1.00)
GFR, Estimated: >60 mL/min (ref >60)
Glucose, Bld: 110 mg/dL — ABNORMAL HIGH (ref 70–99)
Potassium: 3.8 mmol/L (ref 3.5–5.1)
Sodium: 139 mmol/L (ref 135–145)
Total Bilirubin: 1.4 mg/dL — ABNORMAL HIGH (ref 0.3–1.2)
Total Protein: 7.4 g/dL (ref 6.5–8.1)

## 2021-03-14 LAB — VITAMIN B12: Vitamin B-12: 95 pg/mL — ABNORMAL LOW (ref 180–914)

## 2021-03-14 LAB — C-REACTIVE PROTEIN: CRP: 0.6 mg/dL (ref <1.0)

## 2021-03-14 LAB — VITAMIN D 25 HYDROXY (VIT D DEFICIENCY, FRACTURES): Vit D, 25-Hydroxy: 8.44 ng/mL — ABNORMAL LOW (ref 30–100)

## 2021-03-14 LAB — SEDIMENTATION RATE: Sed Rate: 7 mm/hr (ref 0–30)

## 2021-03-14 NOTE — Progress Notes (Signed)
Safety precautions to be maintained throughout the outpatient stay will include: orient to surroundings, keep bed in low position, maintain call bell within reach at all times, provide assistance with transfer out of bed and ambulation.  

## 2021-03-22 LAB — COMPLIANCE DRUG ANALYSIS, UR

## 2021-04-30 NOTE — Progress Notes (Signed)
PROVIDER NOTE: Information contained herein reflects review and annotations entered in association with encounter. Interpretation of such information and data should be left to medically-trained personnel. Information provided to patient can be located elsewhere in the medical record under "Patient Instructions". Document created using STT-dictation technology, any transcriptional errors that may result from process are unintentional.    Patient: Sharon Holden  Service Category: E/M  Provider: Gaspar Cola, MD  DOB: 1961/05/21  DOS: 05/02/2021  Specialty: Interventional Pain Management  MRN: [id number]  Setting: Ambulatory outpatient  PCP: Marguerita Merles, MD  Type: Established Patient    Referring Provider: Marguerita Merles, MD  Location: Office  Delivery: Face-to-face     Primary Reason(s) for Visit: Encounter for evaluation before starting new chronic pain management plan of care (Level of risk: moderate) CC: Back Pain (low), Hand Pain (right), and Knee Pain (right)  HPI  Sharon Holden is a 60 y.o. year old, female patient, who comes today for a follow-up evaluation to review the test results and decide on a treatment plan. She has B12 deficiency; Chronic fatigue; Elevated PTHrP level; Hypertension; Morbid obesity with BMI of 50.0-59.9, adult ([place]); Obstructive sleep apnea; Status post bariatric surgery; Vitamin A deficiency; Vitamin D deficiency; Chronic pain syndrome; Pharmacologic therapy; Disorder of skeletal system; Problems influencing health status; Chronic knee pain (3ry area of Pain) (Bilateral) (R>L); DDD (degenerative disc disease), thoracic; DDD (degenerative disc disease), lumbosacral; Chronic lower extremity pain (2ry area of Pain) (Bilateral) (R>L); Chronic low back pain (1ry area of Pain) (Bilateral) (R>L) w/o sciatica; Long term current use of non-steroidal anti-inflammatories (NSAID); Osteoarthritis of knee (Right); and Osteoarthritis of patellofemoral joint (Right) on their problem  list. Her primarily concern today is the Back Pain (low), Hand Pain (right), and Knee Pain (right)  Pain Assessment: Location: Lower Back Radiating: deies Onset: More than a month ago Duration: Chronic pain Quality: Sharp Severity: 8 /10 (subjective, self-reported pain score)  Effect on ADL: hurts with movement Timing: Constant Modifying factors: sitting BP: (!) 153/87  HR: 72  Sharon Holden comes in today for a follow-up visit after her initial evaluation on 03/14/2021. Today we went over the results of her tests. These were explained in "Layman's terms". During today's appointment we went over my diagnostic impression, as well as the proposed treatment plan.  According to the patient the primary area of pain is that of the lower back (Bilateral) (R>L) she denies any prior surgeries, physical therapy, nerve blocks, or recent x-rays.   The patient's secondary area pain is that of the lower extremities (Bilateral) (R>L).  Currently she is having pain only on the right lower extremity and this pain runs through the back of the leg down to the knee.  She denies any numbness or weakness.  No pain below the knee.  Her pain pattern appears to be more compatible with referred pain from the lumbar region than a radiculopathy.  The patient denies any surgeries, nerve blocks, or physical therapy for the lower extremities and she also denies any recent x-rays.   The patient's third area pain is that of the knees (Bilateral) (R>L).  She denies any knee surgeries, physical therapy, but she does admit to having had 1 injection into the right knee that was done by a physician at the [facility] clinic and it did provide her with some relief of the pain for approximately 1 month.  She denies any recent x-rays of the knees.   The patient's fourth area of pain is  that of her right hand.  She indicates that she has had pain in that hand for the past 4 months with some swelling that seems to affect all of the fingers  except for her thumb.   In addition to the above, the patient has a history that is significant for severe morbid obesity with a BMI of 54.03 kg/m.  She had a bariatric surgery on 2012 and she indicated that she lost 100 pounds which later she regained.  She also indicates having urinary and occasional fecal incontinence for many years.   Unfortunately, in reviewing the chart it would appear that the patient forgot to have her x-rays done.  These will be ordered today.  I will be scheduling her to return for a right knee intra-articular injection using Zilretta.  Even though the patient previously had indicated that her low back pain was worse than the knee pain, today she indicates that she rather have the knee injection done first.  In addition, today we will be sending a referral for medical weight management and I will be sending prescriptions to the pharmacy to start treatment on her vitamin B12 and vitamin D deficiencies.  Controlled Substance Pharmacotherapy Assessment REMS (Risk Evaluation and Mitigation Strategy)  Analgesic: None MME/day: 0 mg/day   Pill Count: None expected due to no prior prescriptions written by our practice. No notes on file Pharmacokinetics: Liberation and absorption (onset of action): WNL Distribution (time to peak effect): WNL Metabolism and excretion (duration of action): WNL         Pharmacodynamics: Desired effects: Analgesia: Ms. Brodbeck reports >50% benefit. Functional ability: Patient reports that medication allows her to accomplish basic ADLs Clinically meaningful improvement in function (CMIF): Sustained CMIF goals met Perceived effectiveness: Described as relatively effective, allowing for increase in activities of daily living (ADL) Undesirable effects: Side-effects or Adverse reactions: None reported Monitoring: [place] PMP: PDMP reviewed during this encounter. Online review of the past 3-month period previously conducted. Not applicable at this point  since we have not taken over the patient's medication management yet. List of other Serum/Urine Drug Screening Test(s):  No results found for: AMPHSCRSER, BARBSCRSER, BENZOSCRSER, COCAINSCRSER, COCAINSCRNUR, PCPSCRSER, THCSCRSER, THCU, CANNABQUANT, [place], [place], [place], [place] List of all UDS test(s) done:  Lab Results  Component Value Date   SUMMARY Note 03/14/2021   Last UDS on record: Summary  Date Value Ref Range Status  03/14/2021 Note  Final    Comment:    ==================================================================== Compliance Drug Analysis, Ur ==================================================================== Test                             Result       Flag       Units    NO DRUGS DETECTED. ==================================================================== Test                      Result    Flag   Units      Ref Range   Creatinine              183              mg/dL      >=20 ==================================================================== Declared Medications:  The flagging and interpretation on this report are based on the  following declared medications.  Unexpected results may arise from  inaccuracies in the declared medications.   **Note: The testing scope of this panel does not include the  following reported  medications:   Etodolac  Furosemide  Hydrochlorothiazide (Zestoretic)  Lisinopril (Zestoretic)  Pantoprazole ==================================================================== For clinical consultation, please call [phone]. ====================================================================    UDS interpretation: No unexpected findings.          Medication Assessment Form: Not applicable. No opioids. Treatment compliance: Not applicable Risk Assessment Profile: Aberrant behavior: See initial evaluations. None observed or detected today Comorbid factors increasing risk of overdose: See initial evaluation. No  additional risks detected today Opioid risk tool (ORT):  Opioid Risk  03/14/2021  Alcohol 1  Illegal Drugs 2  Rx Drugs 4  Alcohol 0  Illegal Drugs 0  Rx Drugs 0  Age between 16-45 years  0  History of Preadolescent Sexual Abuse 0  Psychological Disease 0  Depression 0  Opioid Risk Tool Scoring 7  Opioid Risk Interpretation Moderate Risk    ORT Scoring interpretation table:  Score <3 = Low Risk for SUD  Score between 4-7 = Moderate Risk for SUD  Score >8 = High Risk for Opioid Abuse   Risk of substance use disorder (SUD): Low  Risk Mitigation Strategies:  Patient opioid safety counseling: No controlled substances prescribed. Patient-Prescriber Agreement (PPA): No agreement signed.  Controlled substance notification to other providers: Not applicable  Pharmacologic Plan: No opioid analgesic prescribed.             Laboratory Chemistry Profile   Renal Lab Results  Component Value Date   BUN 12 03/14/2021   CREATININE 0.98 03/14/2021   GFRAA >60 02/03/2017   GFRNONAA >60 03/14/2021   SPECGRAV 1.020 06/26/2016   PHUR 7.0 06/26/2016   PROTEINUR NEGATIVE 02/03/2017     Electrolytes Lab Results  Component Value Date   NA 139 03/14/2021   K 3.8 03/14/2021   CL 104 03/14/2021   CALCIUM 9.3 03/14/2021   MG 1.9 03/14/2021     Hepatic Lab Results  Component Value Date   AST 13 (L) 03/14/2021   ALT 13 03/14/2021   ALBUMIN 4.2 03/14/2021   ALKPHOS 95 03/14/2021   LIPASE 30 02/03/2017     ID Lab Results  Component Value Date   PREGTESTUR NEGATIVE 06/13/2016     Bone Lab Results  Component Value Date   VD25OH 8.44 (L) 03/14/2021     Endocrine Lab Results  Component Value Date   GLUCOSE 110 (H) 03/14/2021   GLUCOSEU NEGATIVE 02/03/2017     Neuropathy Lab Results  Component Value Date   VITAMINB12 95 (L) 03/14/2021     CNS No results found for: COLORCSF, APPEARCSF, RBCCOUNTCSF, WBCCSF, POLYSCSF, LYMPHSCSF, EOSCSF, PROTEINCSF, GLUCCSF, JCVIRUS,  CSFOLI, IGGCSF, LABACHR, ACETBL, LABACHR, ACETBL   Inflammation (CRP: Acute  ESR: Chronic) Lab Results  Component Value Date   CRP 0.6 03/14/2021   ESRSEDRATE 7 03/14/2021     Rheumatology No results found for: RF, ANA, LABURIC, URICUR, LYMEIGGIGMAB, LYMEABIGMQN, HLAB27   Coagulation Lab Results  Component Value Date   PLT 192 02/03/2017   DDIMER  02/10/2007    0.47        AT THE INHOUSE ESTABLISHED CUTOFF VALUE OF 0.48 ug/mL FEU, THIS ASSAY HAS BEEN DOCUMENTED IN THE LITERATURE TO HAVE     Cardiovascular Lab Results  Component Value Date   HGB 13.5 02/03/2017   HCT 40.2 02/03/2017     Screening Lab Results  Component Value Date   PREGTESTUR NEGATIVE 06/13/2016     Cancer No results found for: CEA, CA125, LABCA2   Allergens No results found for: ALMOND, APPLE, ASPARAGUS, AVOCADO,  BANANA, BARLEY, BASIL, BAYLEAF, GREENBEAN, LIMABEAN, WHITEBEAN, BEEFIGE, REDBEET, BLUEBERRY, BROCCOLI, CABBAGE, MELON, CARROT, CASEIN, CASHEWNUT, CAULIFLOWER, CELERY     Note: Lab results reviewed.  Recent Diagnostic Imaging Review  Knee Imaging: Knee-R DG 4 views: Results for orders placed during the hospital encounter of 12/25/20 DG Knee Complete 4 Views Right  Narrative CLINICAL DATA:  Right knee pain over the last several months with difficulty walking.  EXAM: RIGHT KNEE - COMPLETE 4+ VIEW  COMPARISON:  None.  FINDINGS: Osteoarthritis of the knee, most pronounced in the medial compartment with near complete loss of joint height and marginal osteophytes. Considerable degenerative change also at the patellofemoral joint. Lateral compartment is better preserved. There is only a tiny amount of joint fluid. No focal bone lesion.  IMPRESSION: Osteoarthritis of the knee, most pronounced in the medial compartment. Patellofemoral osteoarthritis also present. Small joint effusion.   Electronically Signed By: Nelson Chimes M.D. On: 12/26/2020 14:33  Complexity Note: Imaging  results reviewed. Results shared with Ms. Petitti, using Layman's terms.                        Meds   Current Outpatient Medications:    Cholecalciferol (VITAMIN D3) 125 MCG (5000 UT) CAPS, Take 1 capsule (5,000 Units total) by mouth daily with breakfast. Take along with calcium and magnesium., Disp: 30 capsule, Rfl: 2   Cyanocobalamin (VITAMIN B-12) 5000 MCG SUBL, Place 1 tablet (5,000 mcg total) under the tongue daily., Disp: 30 tablet, Rfl: 0   [START ON 05/03/2021] ergocalciferol (VITAMIN D2) 1.25 MG (50000 UT) capsule, Take 1 capsule (50,000 Units total) by mouth 2 (two) times a week. X 6 weeks., Disp: 12 capsule, Rfl: 0   furosemide (LASIX) 20 MG tablet, 1 BY MOUTH EVERY MORNING FOR FLUID RETENTION AND HIGH BLOOD PRESSURE, Disp: , Rfl:    lisinopril-hydrochlorothiazide (PRINZIDE,ZESTORETIC) 20-12.5 MG per tablet, Take 1 tablet by mouth daily., Disp: , Rfl:    etodolac (LODINE) 400 MG tablet, Take 400 mg by mouth 2 (two) times daily., Disp: , Rfl:    meloxicam (MOBIC) 15 MG tablet, Take 15 mg by mouth daily. (Patient not taking: Reported on 05/02/2021), Disp: , Rfl:    pantoprazole (PROTONIX) 20 MG tablet, Take 1 tablet (20 mg total) by mouth daily. (Patient not taking: Reported on 05/02/2021), Disp: 30 tablet, Rfl: 1  ROS  Constitutional: Denies any fever or chills Gastrointestinal: No reported hemesis, hematochezia, vomiting, or acute GI distress Musculoskeletal: Denies any acute onset joint swelling, redness, loss of ROM, or weakness Neurological: No reported episodes of acute onset apraxia, aphasia, dysarthria, agnosia, amnesia, paralysis, loss of coordination, or loss of consciousness  Allergies  Ms. Couey has No Known Allergies.  PFSH  Drug: Ms. Hagerty  reports no history of drug use. Alcohol:  reports current alcohol use. Tobacco:  reports that she has never smoked. She has never used smokeless tobacco. Medical:  has a past medical history of Arthritis, History of kidney stones,  Hypertension, Kidney stones, and Sleep apnea. Surgical: Ms. Fryman  has a past surgical history that includes Gastric bypass; Cesarean section; Ureteroscopy with holmium laser lithotripsy (Left, 06/20/2016); and Cystoscopy with stent placement (Left, 06/20/2016). Family: family history is not on file.  Constitutional Exam  General appearance: Well nourished, well developed, and well hydrated. In no apparent acute distress Vitals:   05/02/21 1305  BP: (!) 153/87  Pulse: 72  Resp: 18  Temp: (!) 97.3 F (36.3 C)  SpO2: 100%  Weight: (!) 345 lb (156.5 kg)  Height: $Remove'5\' 8"'SiDUnWz$  (1.727 m)   BMI Assessment: Estimated body mass index is 52.46 kg/m as calculated from the following:   Height as of this encounter: $RemoveBeforeD'5\' 8"'krhNcUfCFQNChh$  (1.727 m).   Weight as of this encounter: 345 lb (156.5 kg).  BMI interpretation table: BMI level Category Range association with higher incidence of chronic pain  <18 kg/m2 Underweight   18.5-24.9 kg/m2 Ideal body weight   25-29.9 kg/m2 Overweight Increased incidence by 20%  30-34.9 kg/m2 Obese (Class I) Increased incidence by 68%  35-39.9 kg/m2 Severe obesity (Class II) Increased incidence by 136%  >40 kg/m2 Extreme obesity (Class III) Increased incidence by 254%   Patient's current BMI Ideal Body weight  Body mass index is 52.46 kg/m. Ideal body weight: 63.9 kg (140 lb 14 oz) Adjusted ideal body weight: 100.9 kg (222 lb 8.4 oz)   BMI Readings from Last 4 Encounters:  05/02/21 52.46 kg/m  03/14/21 54.03 kg/m  01/21/21 49.95 kg/m  02/03/17 50.02 kg/m   Wt Readings from Last 4 Encounters:  05/02/21 (!) 345 lb (156.5 kg)  03/14/21 (!) 345 lb (156.5 kg)  01/21/21 (!) 328 lb 7.8 oz (149 kg)  02/03/17 (!) 329 lb (149.2 kg)    Psych/Mental status: Alert, oriented x 3 (person, place, & time)       Eyes: PERLA Respiratory: No evidence of acute respiratory distress  Assessment & Plan  Primary Diagnosis & Pertinent Problem List: The primary encounter diagnosis was  Chronic pain syndrome. Diagnoses of Chronic low back pain (1ry area of Pain) (Bilateral) (R>L) w/o sciatica, DDD (degenerative disc disease), lumbosacral, DDD (degenerative disc disease), thoracic, Chronic lower extremity pain (2ry area of Pain) (Bilateral) (R>L), Chronic knee pain (3ry area of Pain) (Bilateral) (R>L), Osteoarthritis of patellofemoral joint (Right), Osteoarthritis of knee (Right), Chronic fatigue, Vitamin D deficiency, B12 deficiency, and Morbid obesity with BMI of 50.0-59.9, adult ([place]) were also pertinent to this visit.  Visit Diagnosis: 1. Chronic pain syndrome   2. Chronic low back pain (1ry area of Pain) (Bilateral) (R>L) w/o sciatica   3. DDD (degenerative disc disease), lumbosacral   4. DDD (degenerative disc disease), thoracic   5. Chronic lower extremity pain (2ry area of Pain) (Bilateral) (R>L)   6. Chronic knee pain (3ry area of Pain) (Bilateral) (R>L)   7. Osteoarthritis of patellofemoral joint (Right)   8. Osteoarthritis of knee (Right)   9. Chronic fatigue   10. Vitamin D deficiency   11. B12 deficiency   12. Morbid obesity with BMI of 50.0-59.9, adult ([place])    Problems updated and reviewed during this visit: Problem  Osteoarthritis of knee (Right)    Plan of Care  Pharmacotherapy (Medications Ordered): Meds ordered this encounter  Medications   ergocalciferol (VITAMIN D2) 1.25 MG (50000 UT) capsule    Sig: Take 1 capsule (50,000 Units total) by mouth 2 (two) times a week. X 6 weeks.    Dispense:  12 capsule    Refill:  0    Fill one day early if pharmacy is closed on scheduled refill date. May substitute for generic, or similar, if available.   Cholecalciferol (VITAMIN D3) 125 MCG (5000 UT) CAPS    Sig: Take 1 capsule (5,000 Units total) by mouth daily with breakfast. Take along with calcium and magnesium.    Dispense:  30 capsule    Refill:  2    Fill 1 day early if pharmacy is closed on scheduled refill date. Generic permitted. Do  not send renewal  requests.   Cyanocobalamin (VITAMIN B-12) 5000 MCG SUBL    Sig: Place 1 tablet (5,000 mcg total) under the tongue daily.    Dispense:  30 tablet    Refill:  0    Fill 1 day early if pharmacy is closed on scheduled refill date. Generic permitted. Do not send renewal requests.   Procedure Orders         KNEE INJECTION     Lab Orders  No laboratory test(s) ordered today   Imaging Orders         DG Lumbar Spine Complete W/Bend         DG Si Joints         DG Knee Complete 4 Views Right         DG Knee Complete 4 Views Left     Referral Orders         Amb Ref to Medical Weight Management         Amb Ref to Medical Weight Management      Pharmacological management options:  Opioid Analgesics: I will not be prescribing any opioids at this time Membrane stabilizer: None prescribed at this time Muscle relaxant: None prescribed at this time NSAID: None prescribed at this time Other analgesic(s): None prescribed at this time      Interventional Therapies  Risk  Complexity Considerations:   Estimated body mass index is 52.46 kg/m as calculated from the following:   Height as of this encounter: $RemoveBeforeD'5\' 8"'VsuQdHYdbbGdGT$  (1.727 m).   Weight as of this encounter: 345 lb (156.5 kg). WNL   Planned  Pending:   Diagnostic/therapeutic right IA ER steroid (Zilretta) knee injection #1 under fluoroscopic guidance    Under consideration:   Diagnostic bilateral lumbar facet block #1  Therapeutic right IA Zilretta knee injection #1  Therapeutic right IA Monovisc knee injection #1    Completed:   None at this time   Therapeutic  Palliative (PRN) options:   None established     Provider-requested follow-up: Return for (Clinic) procedure:, (NS) (R) IA ER Steroid Knee inj. #1 under fluoroscopic guidance. Recent Visits Date Type Provider Dept  03/14/21 Office Visit Milinda Pointer, MD [facility]  Showing recent visits within past 90 days and meeting all other requirements Today's  Visits Date Type Provider Dept  05/02/21 Office Visit Milinda Pointer, MD [facility]  Showing today's visits and meeting all other requirements Future Appointments Date Type Provider Dept  05/22/21 Appointment Milinda Pointer, MD [facility]  Showing future appointments within next 90 days and meeting all other requirements Primary Care Physician: Marguerita Merles, MD Note by: Gaspar Cola, MD Date: 05/02/2021; Time: 1:43 PM

## 2021-05-02 ENCOUNTER — Ambulatory Visit
Admission: RE | Admit: 2021-05-02 | Discharge: 2021-05-02 | Disposition: A | Discharge status: Home / Self Care | Payer: BLUE CROSS/BLUE SHIELD | Source: Ambulatory Visit | Attending: Pain Medicine | Admitting: Pain Medicine

## 2021-05-02 ENCOUNTER — Ambulatory Visit (HOSPITAL_BASED_OUTPATIENT_CLINIC_OR_DEPARTMENT_OTHER): Payer: BLUE CROSS/BLUE SHIELD | Admitting: Pain Medicine

## 2021-05-02 ENCOUNTER — Encounter: Payer: Self-pay | Admitting: Pain Medicine

## 2021-05-02 ENCOUNTER — Other Ambulatory Visit: Payer: Self-pay

## 2021-05-02 VITALS — BP 153/87 | HR 72 | Temp 97.3°F | Resp 18 | Ht 68.0 in | Wt 345.0 lb

## 2021-05-02 DIAGNOSIS — M25562 Pain in left knee: Secondary | ICD-10-CM

## 2021-05-02 DIAGNOSIS — G8929 Other chronic pain: Secondary | ICD-10-CM | POA: Insufficient documentation

## 2021-05-02 DIAGNOSIS — M1711 Unilateral primary osteoarthritis, right knee: Secondary | ICD-10-CM | POA: Insufficient documentation

## 2021-05-02 DIAGNOSIS — M25561 Pain in right knee: Secondary | ICD-10-CM

## 2021-05-02 DIAGNOSIS — M545 Low back pain, unspecified: Secondary | ICD-10-CM

## 2021-05-02 DIAGNOSIS — M5134 Other intervertebral disc degeneration, thoracic region: Secondary | ICD-10-CM | POA: Insufficient documentation

## 2021-05-02 DIAGNOSIS — E538 Deficiency of other specified B group vitamins: Secondary | ICD-10-CM | POA: Insufficient documentation

## 2021-05-02 DIAGNOSIS — E559 Vitamin D deficiency, unspecified: Secondary | ICD-10-CM

## 2021-05-02 DIAGNOSIS — G894 Chronic pain syndrome: Secondary | ICD-10-CM | POA: Diagnosis not present

## 2021-05-02 DIAGNOSIS — Z6841 Body Mass Index (BMI) 40.0 and over, adult: Secondary | ICD-10-CM | POA: Insufficient documentation

## 2021-05-02 DIAGNOSIS — M51379 Other intervertebral disc degeneration, lumbosacral region without mention of lumbar back pain or lower extremity pain: Secondary | ICD-10-CM

## 2021-05-02 DIAGNOSIS — M79604 Pain in right leg: Secondary | ICD-10-CM | POA: Insufficient documentation

## 2021-05-02 DIAGNOSIS — M79605 Pain in left leg: Secondary | ICD-10-CM

## 2021-05-02 DIAGNOSIS — M5137 Other intervertebral disc degeneration, lumbosacral region: Secondary | ICD-10-CM

## 2021-05-02 DIAGNOSIS — R5382 Chronic fatigue, unspecified: Secondary | ICD-10-CM | POA: Insufficient documentation

## 2021-05-02 IMAGING — CR DG KNEE COMPLETE 4+V*R*
4 series · 4 of 4 positions shown · non-contrast
Comparison: None.

CLINICAL DATA: Chronic knee pain

EXAM:
RIGHT KNEE - COMPLETE 4+ VIEW; LEFT KNEE - COMPLETE 4+ VIEW

[knee ap]
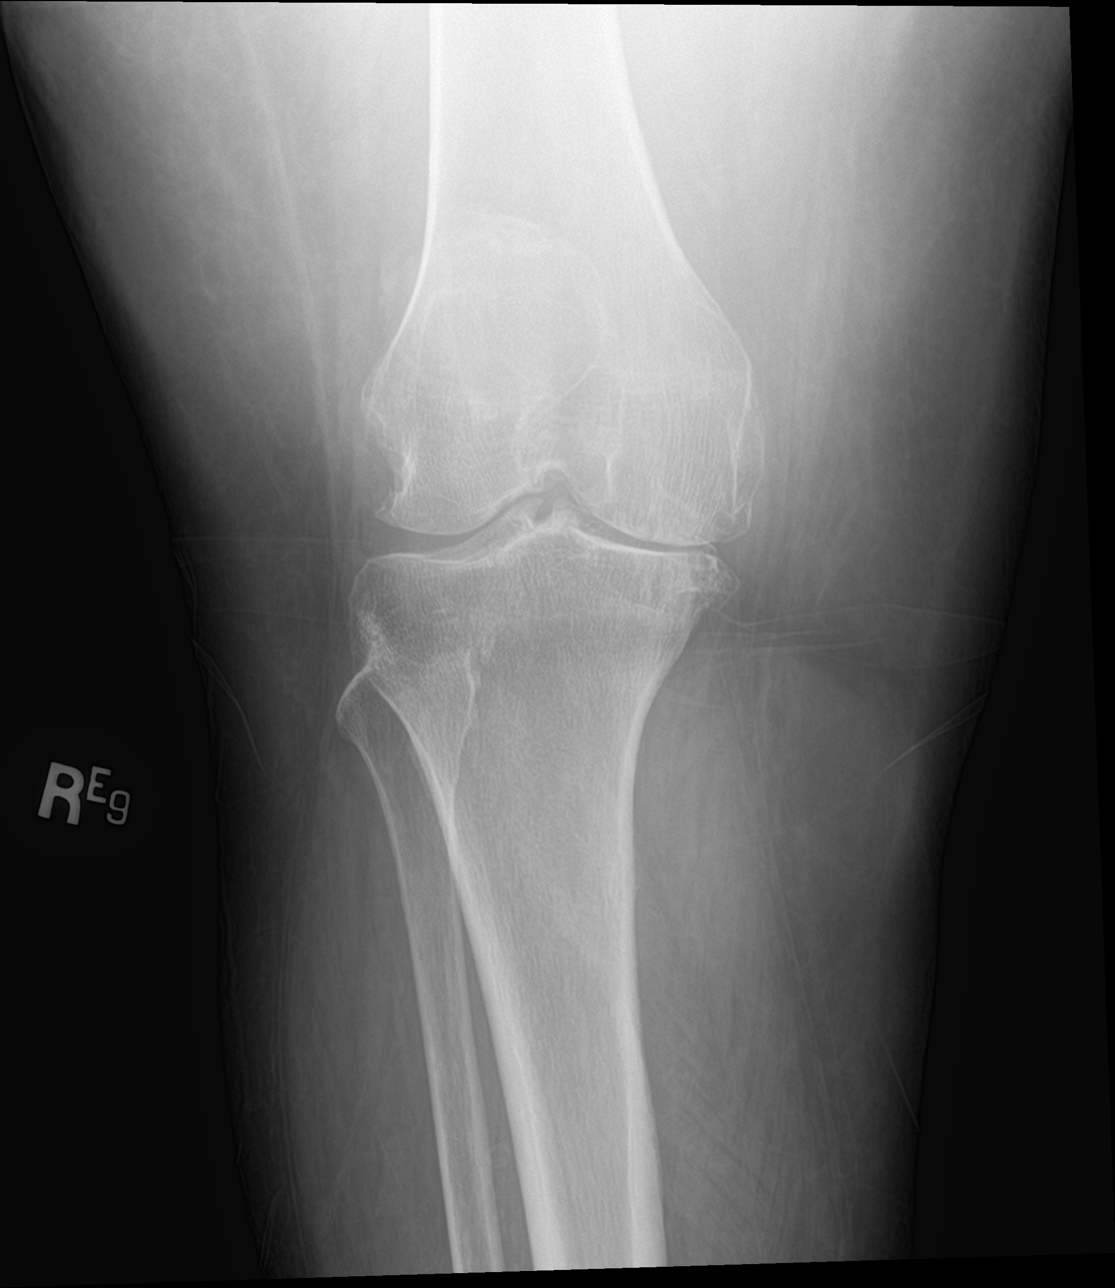

[knee obl (1 of 2)]
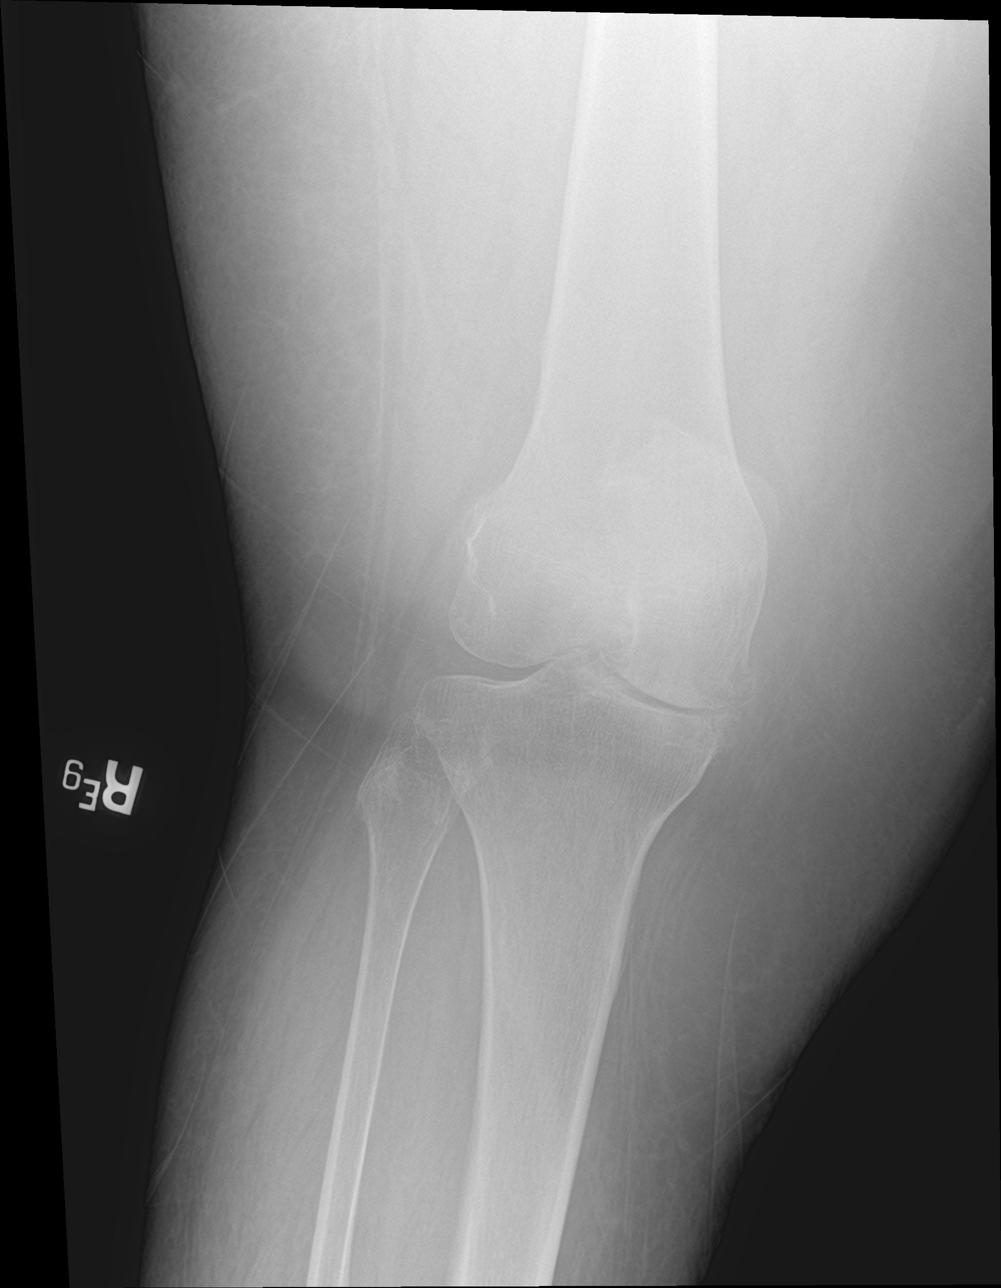

[knee obl (2 of 2)]
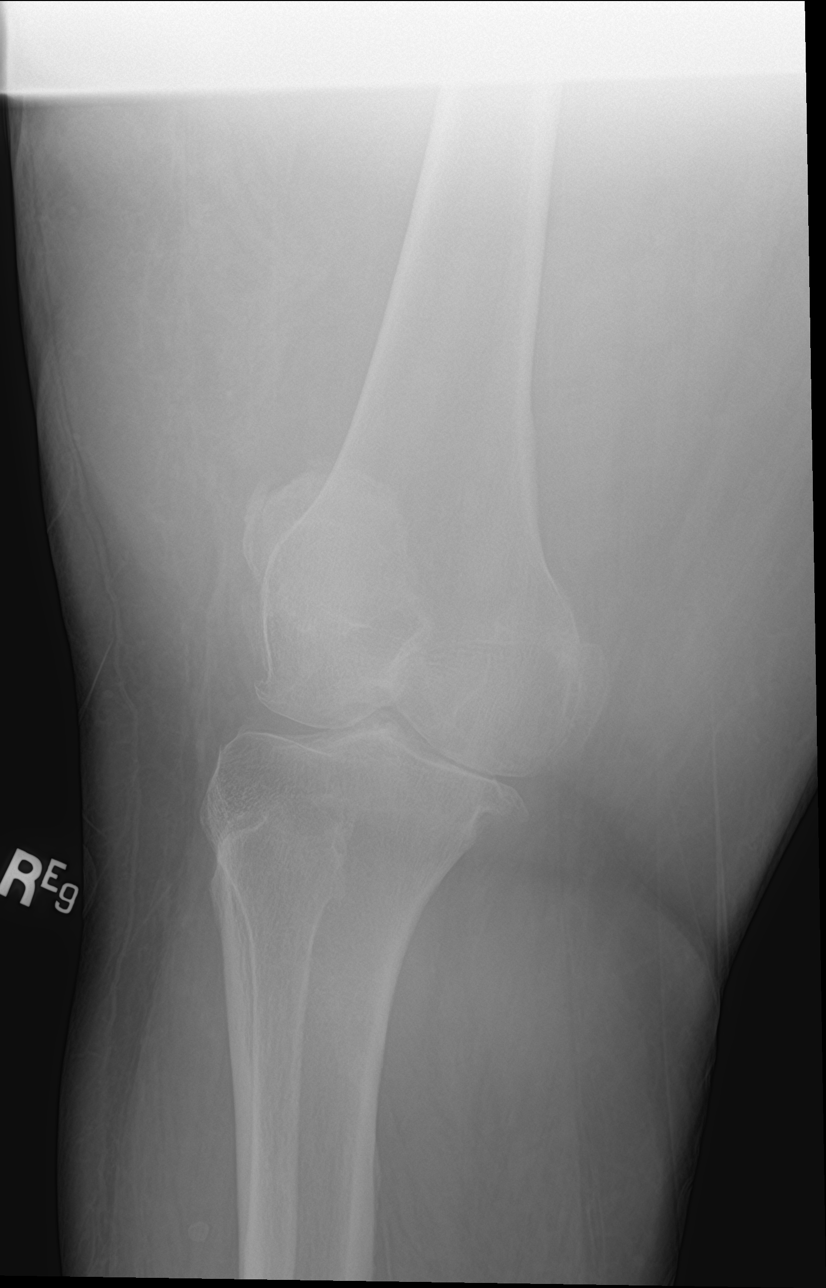

[knee lat]
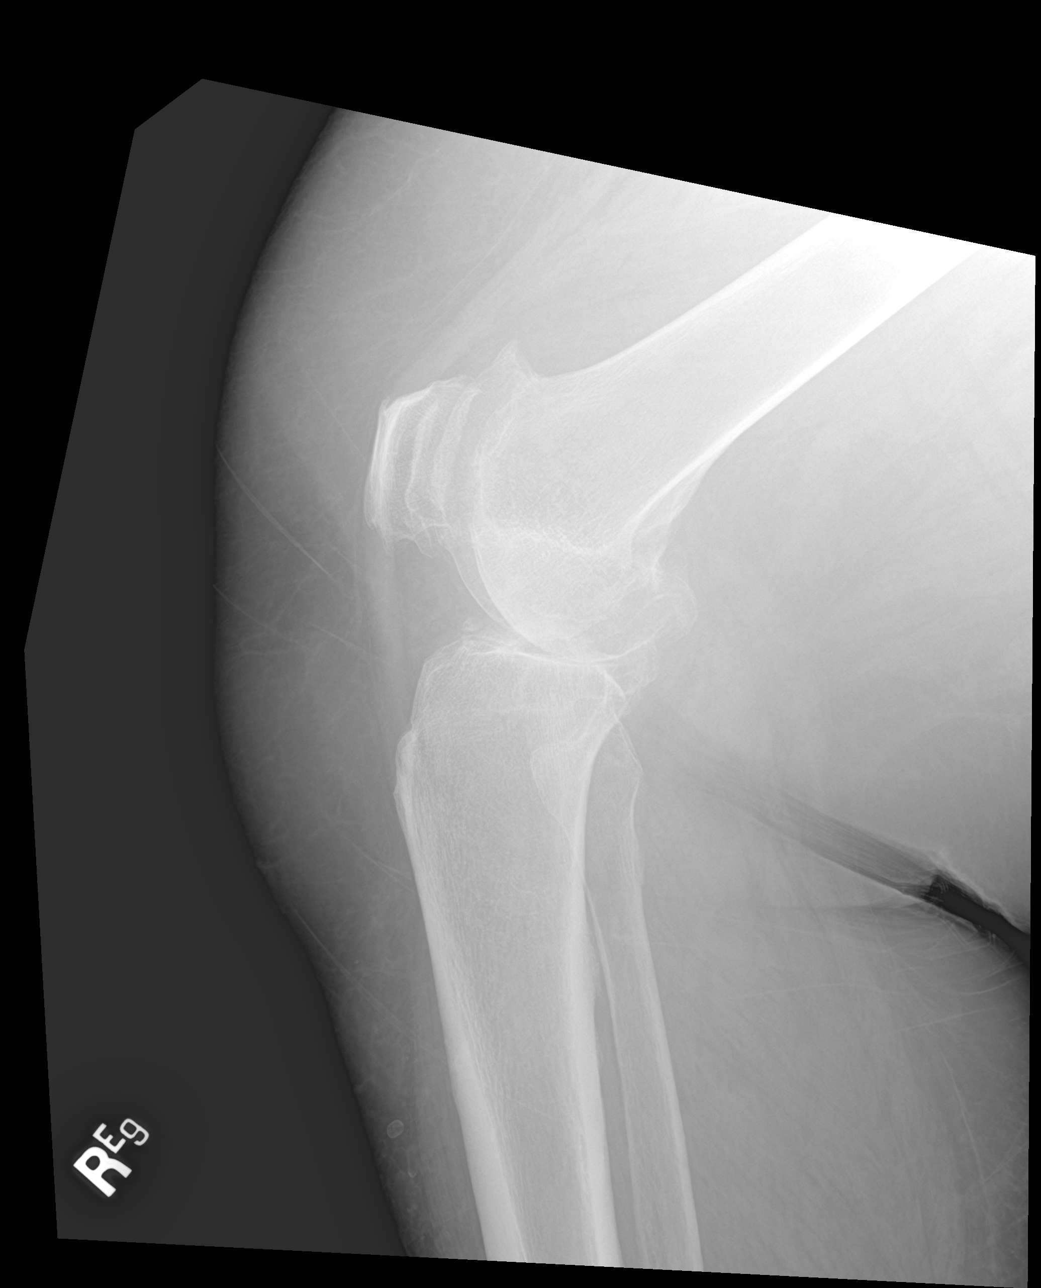

[4 of 4 positions shown; findings below may reference images not displayed]

FINDINGS: No fracture or dislocation of the bilateral knees. There is moderate
to severe tricompartmental arthrosis of the bilateral knees,
somewhat worse on the right, and worst in the medial compartment of
the right knee and bilateral patellofemoral compartments. No knee
joint effusion. Soft tissues are unremarkable.
IMPRESSION: 1. No fracture or dislocation of the bilateral knees.
2. There is moderate to severe tricompartmental arthrosis of the
bilateral knees, somewhat worse on the right, and worst in the
medial compartment of the right knee and bilateral patellofemoral
compartments.

## 2021-05-02 IMAGING — CR DG LUMBAR SPINE COMPLETE W/ BEND
7 series · 7 of 7 positions shown · non-contrast
Comparison: [DATE]

CLINICAL DATA: Chronic low back pain

EXAM:
LUMBAR SPINE - COMPLETE WITH BENDING VIEWS

[l-spine ap]
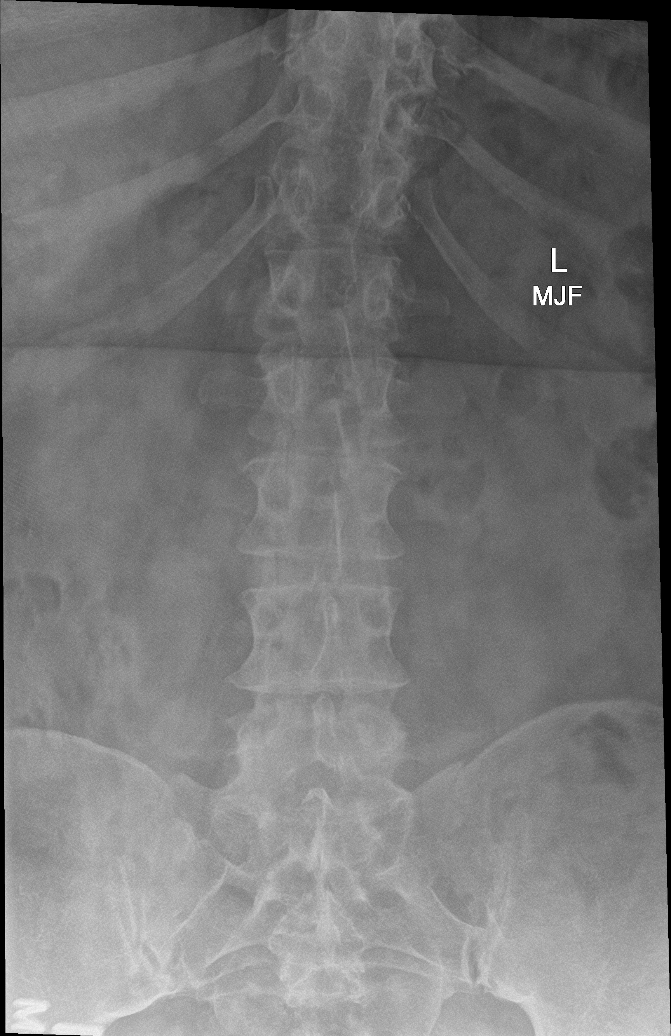

[l-spine obl (1 of 2)]
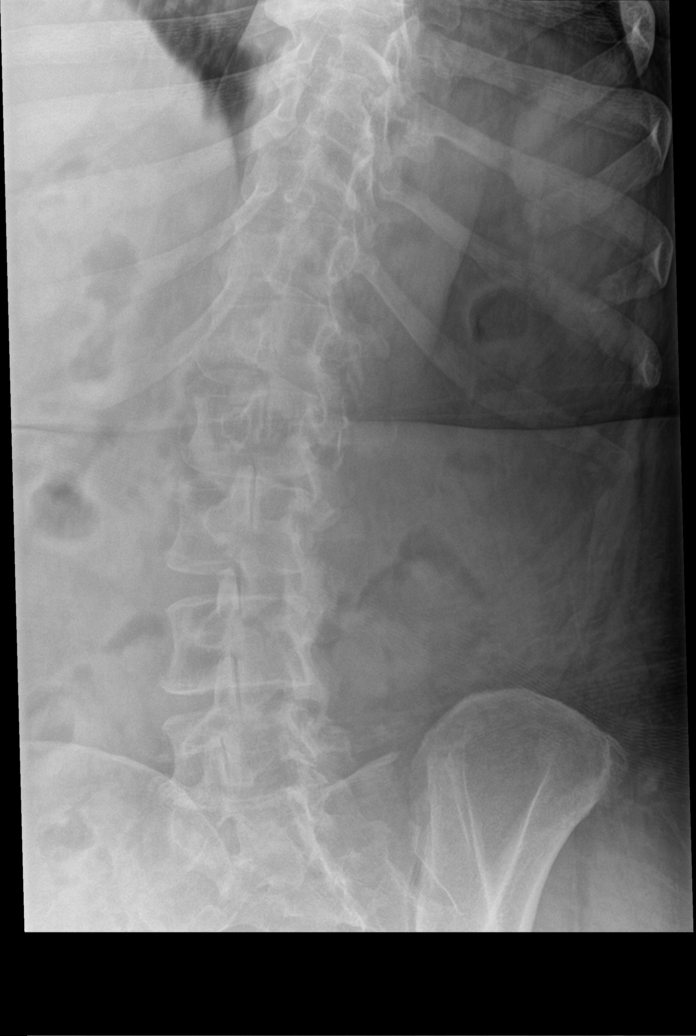

[l-spine obl (2 of 2)]
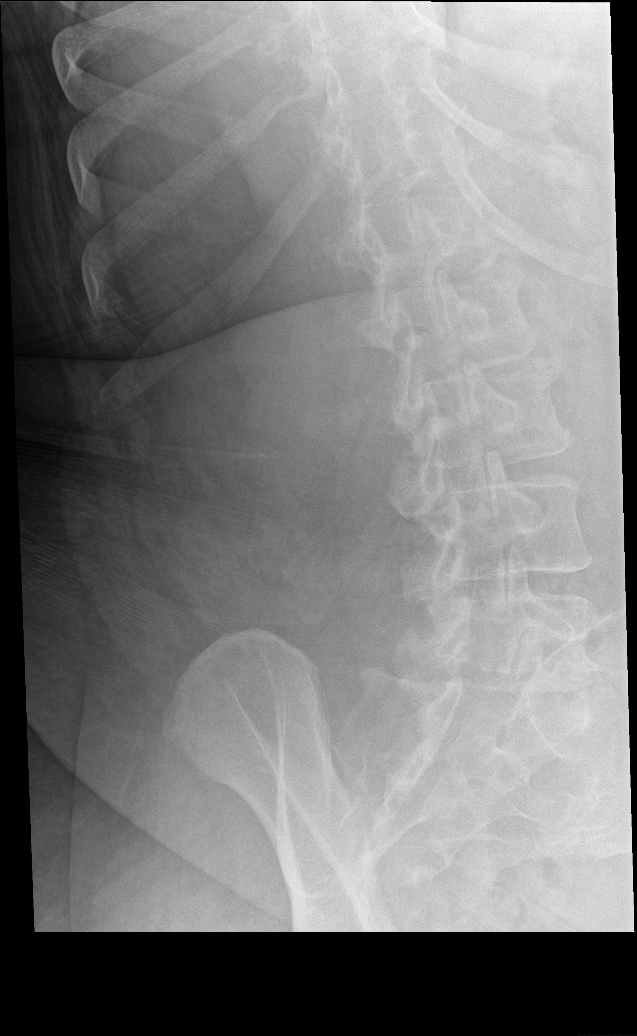

[l-spine lat]
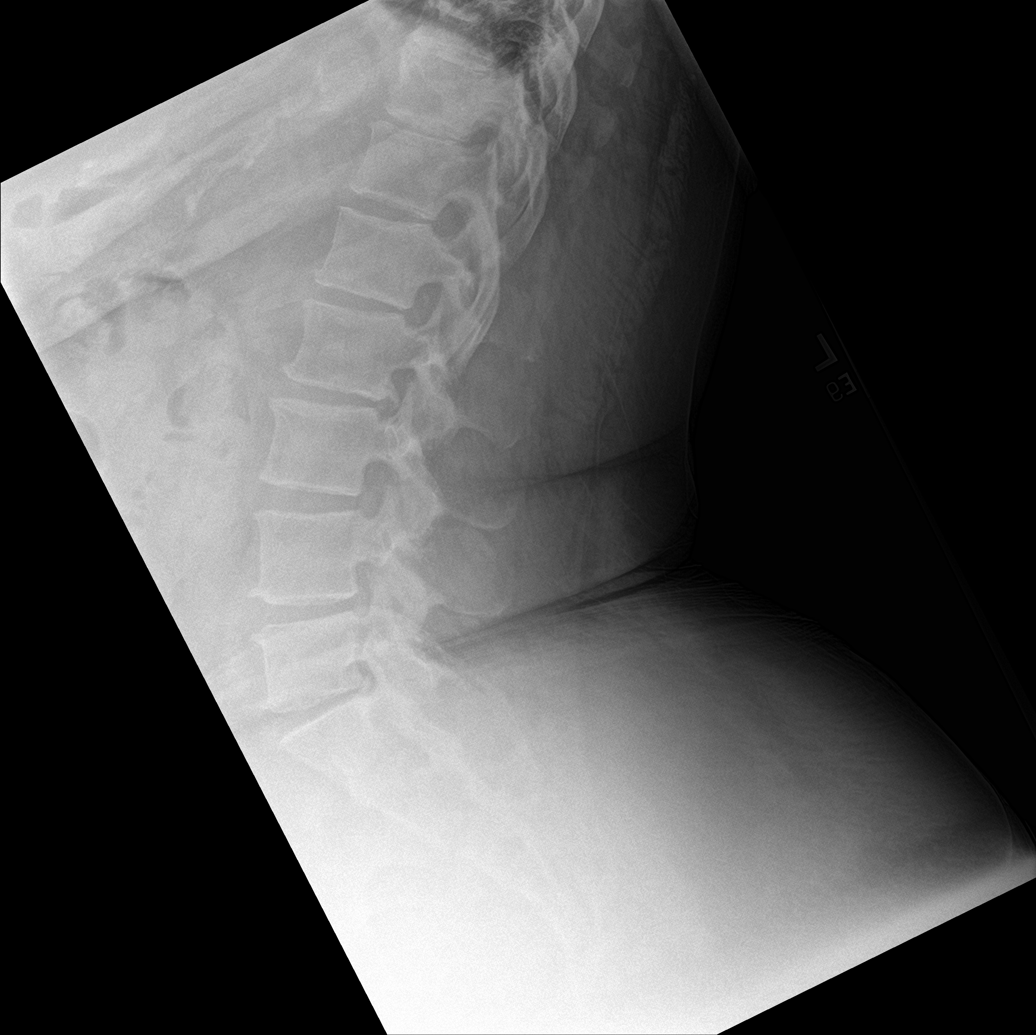

[l-spine flex]
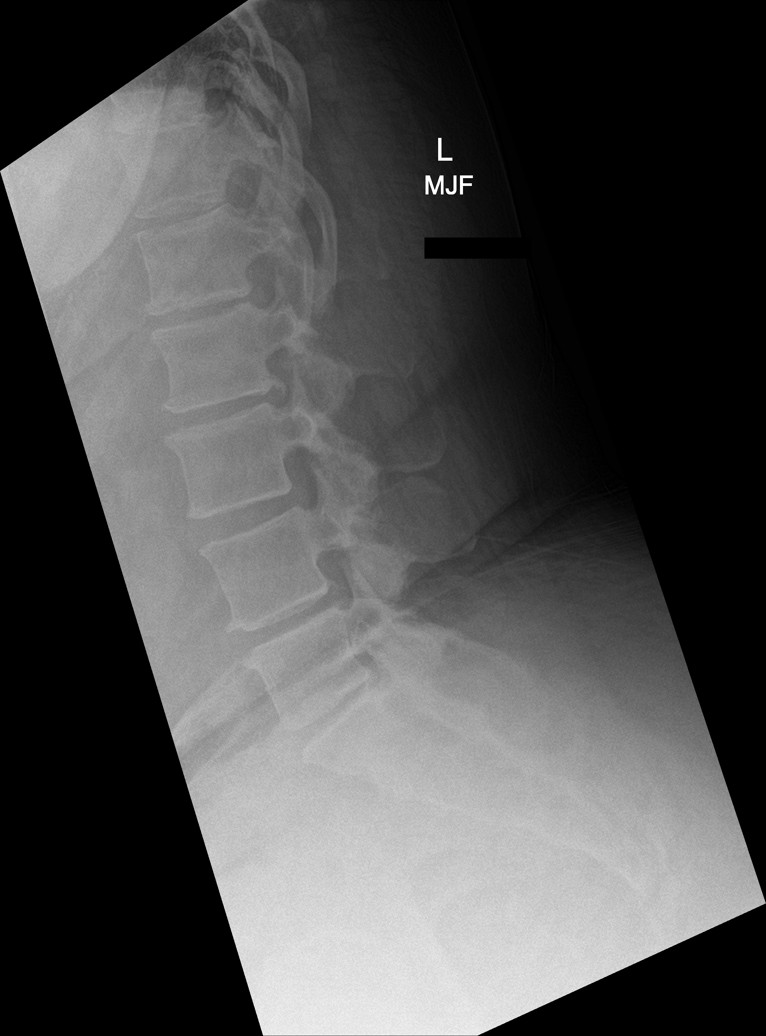

[l-spine ext]
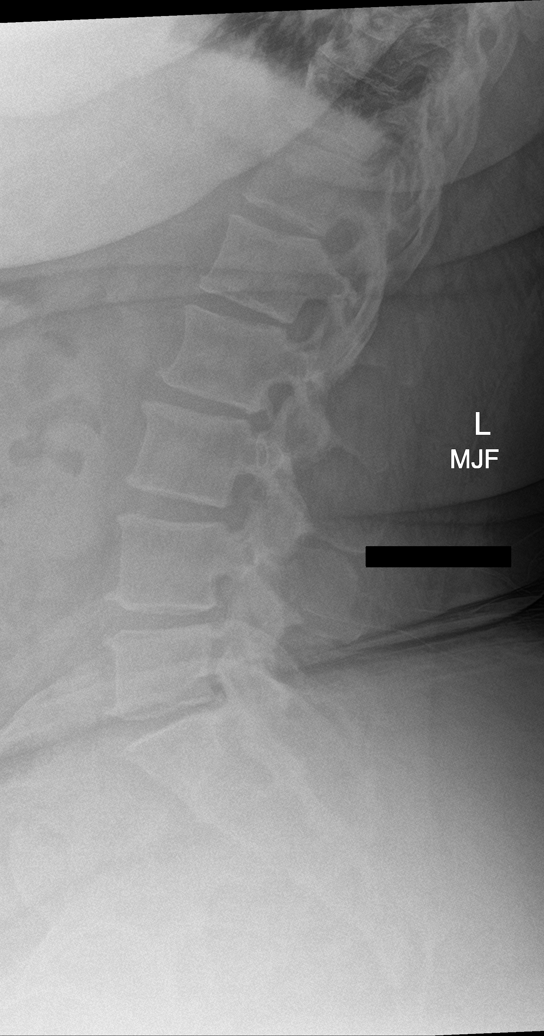

[l-spine spot]
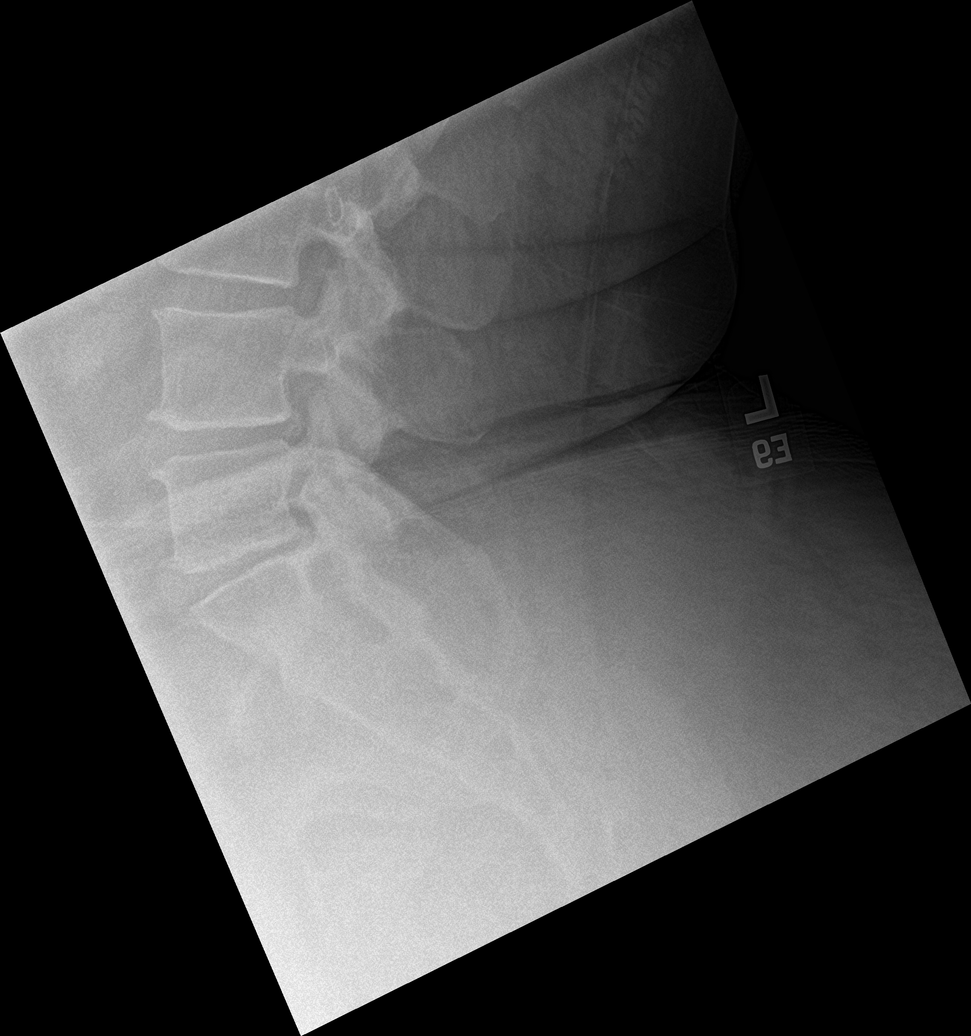

[7 of 7 positions shown; findings below may reference images not displayed]

FINDINGS: Normal lumbar lordosis. No acute fracture or listhesis of the lumbar
spine. There is mild intervertebral disc space narrowing and
endplate remodeling at L1-2, L2-3, L4-5, and L5-S1 in keeping with
changes of mild degenerative disc disease, progressive at L1-L3
since prior examination. Oblique views demonstrate no evidence of
pars defect. Flexion and extension positioning demonstrates no
abnormal angulatory or translatory motion.
IMPRESSION: No acute fracture or listhesis.

Mild multilevel degenerative disc disease, progressive at L1-L3.

## 2021-05-02 IMAGING — CR DG SI JOINTS 3+V
3 series · 3 of 3 positions shown · non-contrast
Comparison: None.

CLINICAL DATA: Sacroiliac joint pain

EXAM:
BILATERAL SACROILIAC JOINTS - 3+ VIEW

[si joints ap]
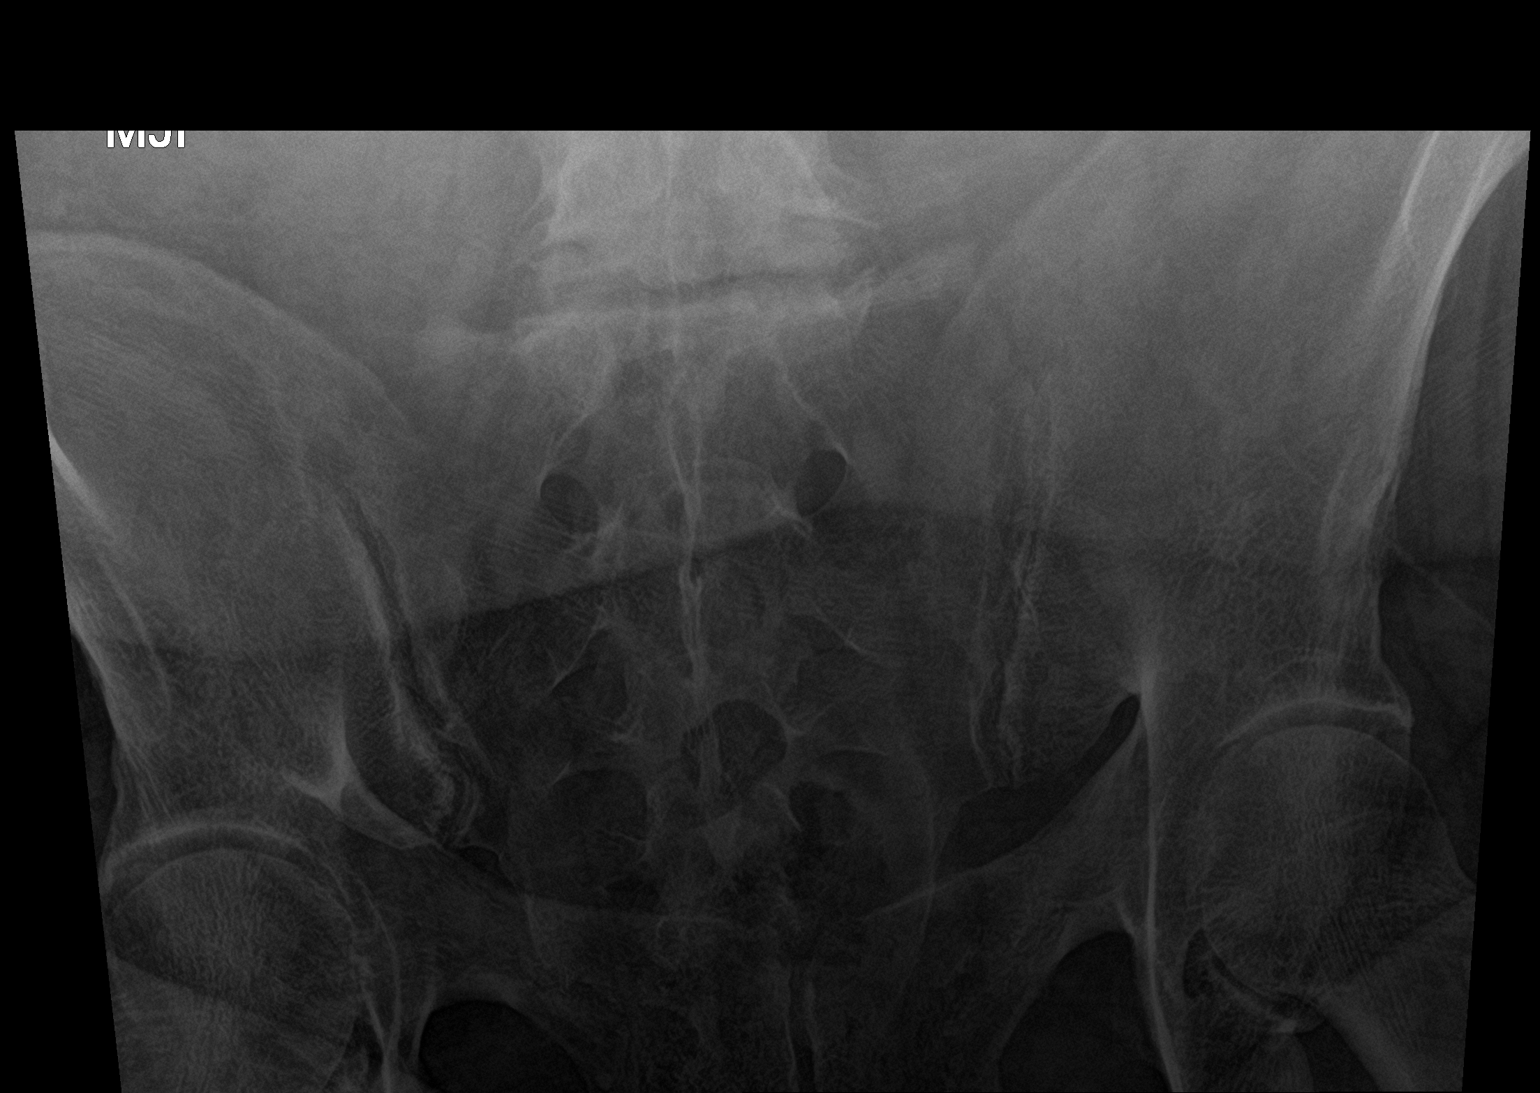

[si joints obl (1 of 2)]
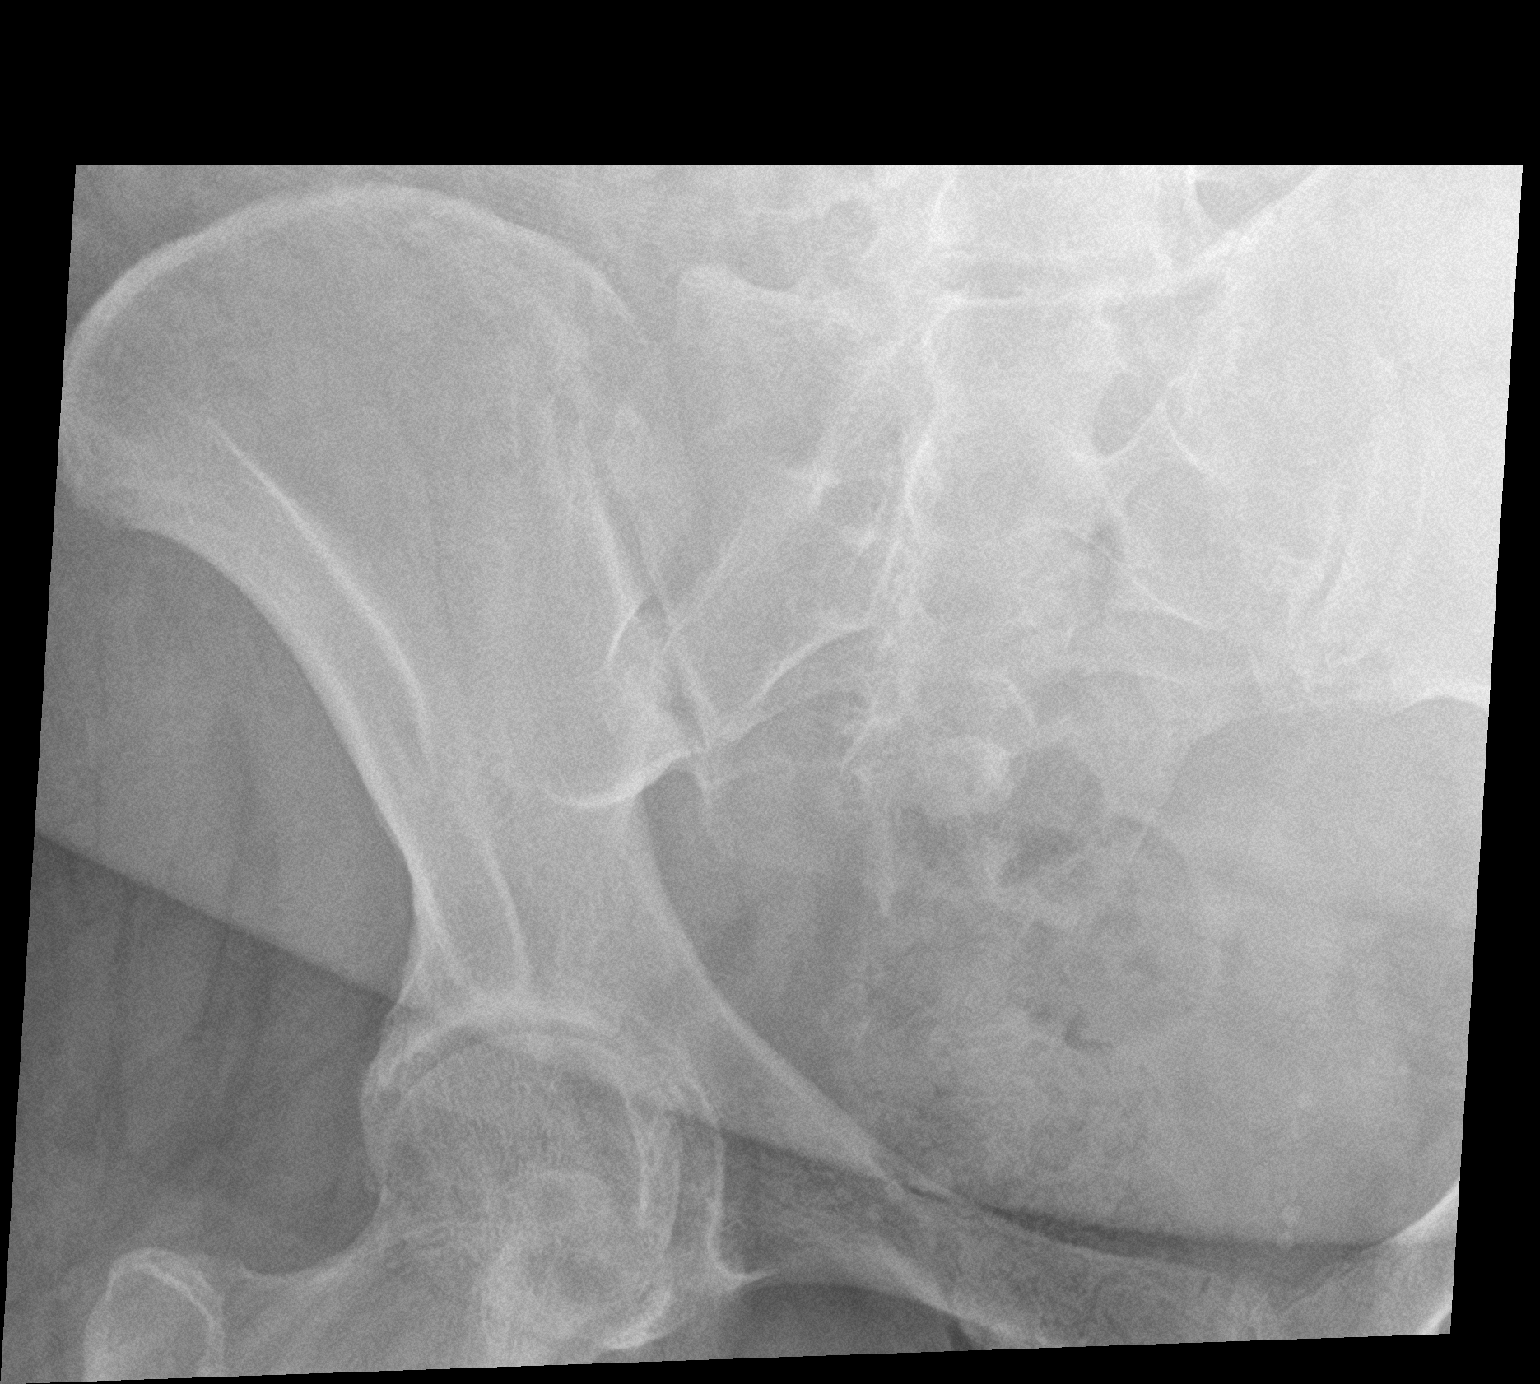

[si joints obl (2 of 2)]
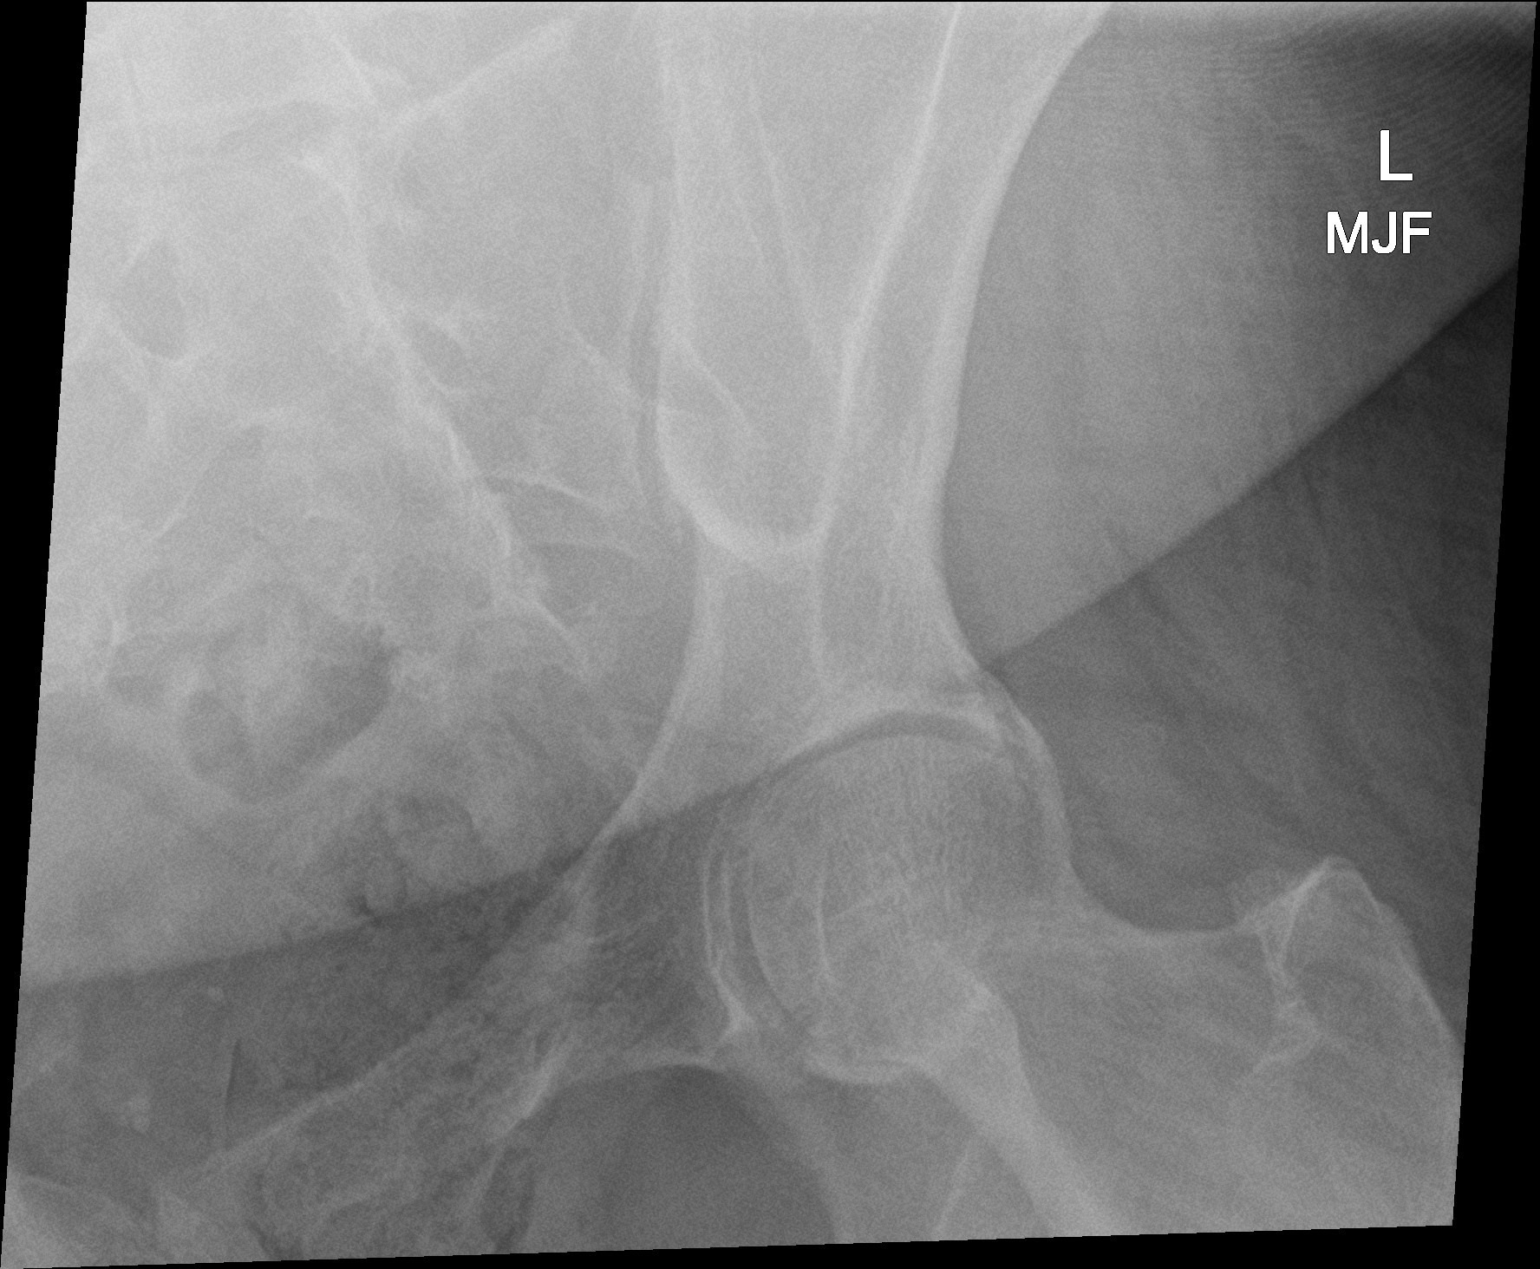

[3 of 3 positions shown; findings below may reference images not displayed]

FINDINGS: The sacroiliac joint spaces are maintained and there is no evidence
of arthropathy. No other bone abnormalities are seen.
IMPRESSION: Negative.

## 2021-05-02 IMAGING — CR DG KNEE COMPLETE 4+V*L*
4 series · 4 of 4 positions shown · non-contrast
Comparison: None.

CLINICAL DATA: Chronic knee pain

EXAM:
RIGHT KNEE - COMPLETE 4+ VIEW; LEFT KNEE - COMPLETE 4+ VIEW

[knee ap]
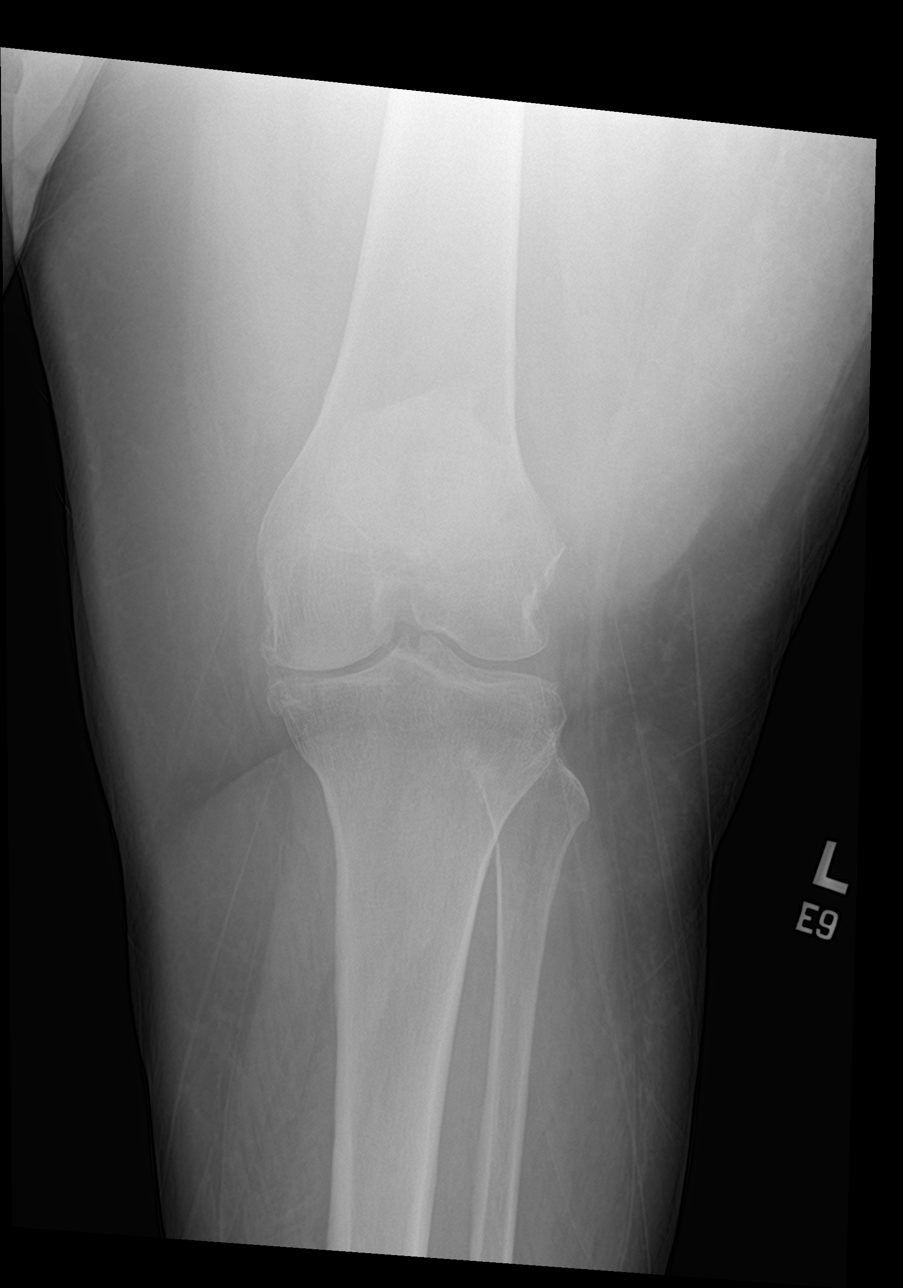

[knee obl (1 of 2)]
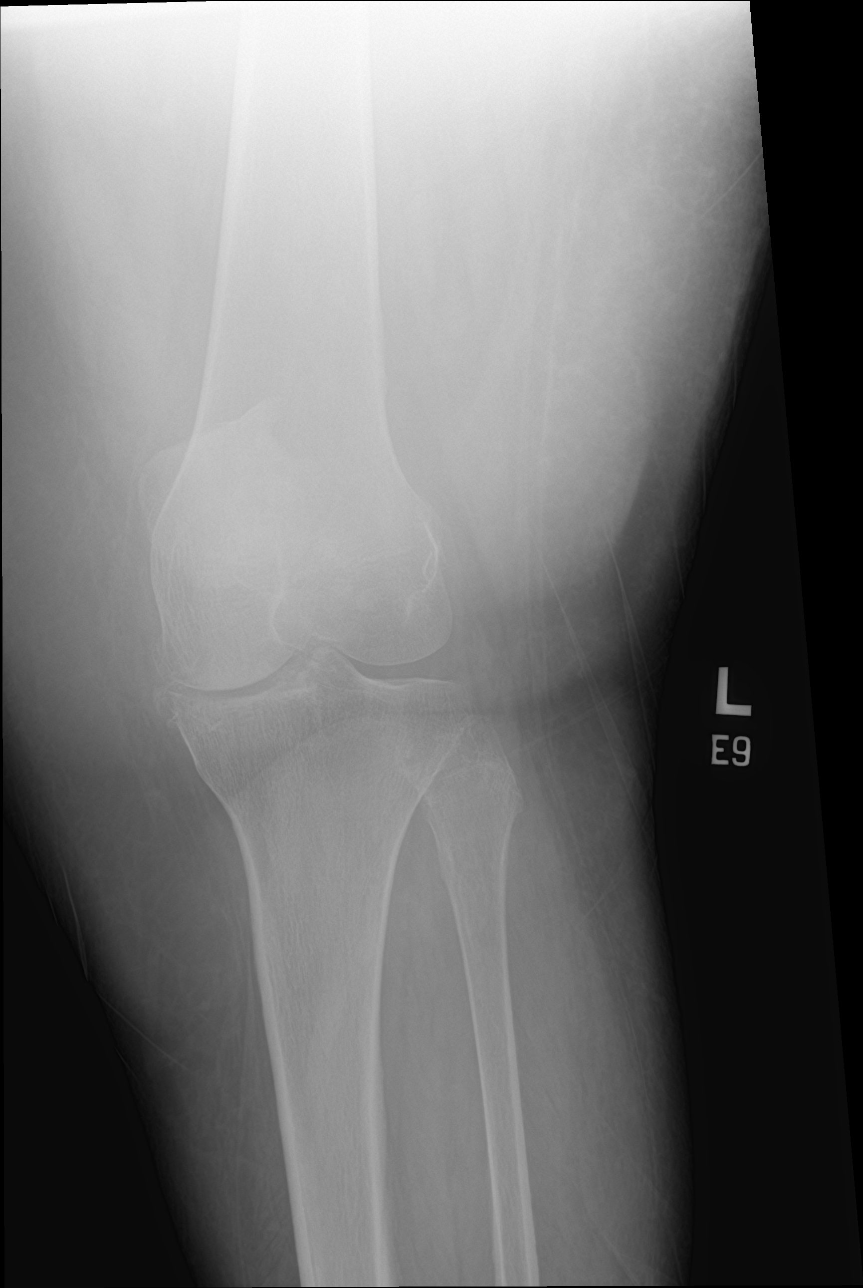

[knee obl (2 of 2)]
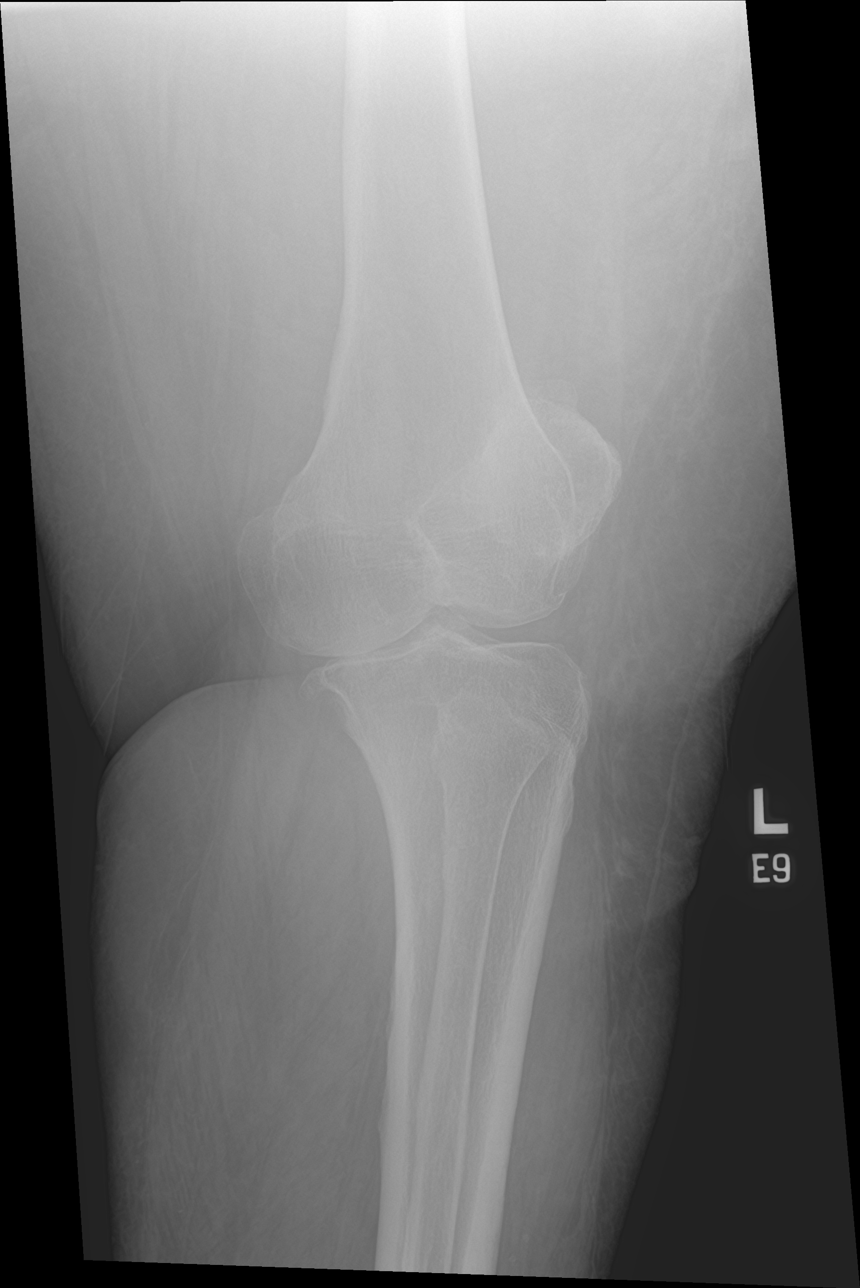

[knee lat]
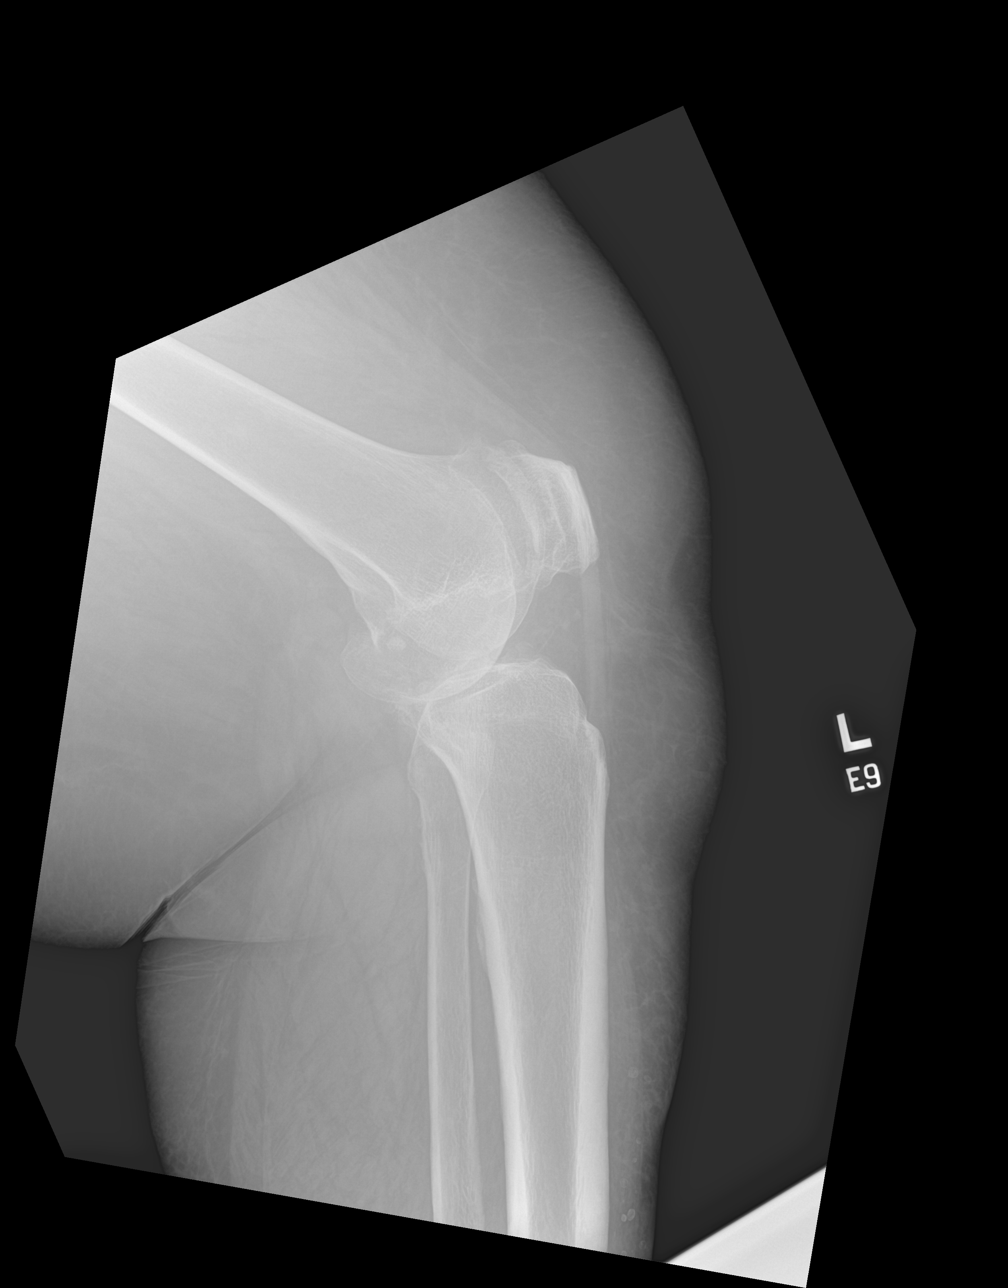

[4 of 4 positions shown; findings below may reference images not displayed]

FINDINGS: No fracture or dislocation of the bilateral knees. There is moderate
to severe tricompartmental arthrosis of the bilateral knees,
somewhat worse on the right, and worst in the medial compartment of
the right knee and bilateral patellofemoral compartments. No knee
joint effusion. Soft tissues are unremarkable.
IMPRESSION: 1. No fracture or dislocation of the bilateral knees.
2. There is moderate to severe tricompartmental arthrosis of the
bilateral knees, somewhat worse on the right, and worst in the
medial compartment of the right knee and bilateral patellofemoral
compartments.

## 2021-05-02 MED ORDER — VITAMIN B-12 5000 MCG SL SUBL: 5000.0000 ug | SUBLINGUAL_TABLET | Freq: Every day | SUBLINGUAL | 0 refills | Status: AC

## 2021-05-02 MED ORDER — ERGOCALCIFEROL 1.25 MG (50000 UT) PO CAPS: 50000.0000 [IU] | ORAL_CAPSULE | ORAL | 0 refills | Status: AC

## 2021-05-02 MED ORDER — VITAMIN D3 125 MCG (5000 UT) PO CAPS: 1.0000 | ORAL_CAPSULE | Freq: Every day | ORAL | 2 refills | Status: AC

## 2021-05-02 NOTE — Patient Instructions (Signed)
____________________________________________________________________________________________  General Risks and Possible Complications  Patient Responsibilities: It is important that you read this as it is part of your informed consent. It is our duty to inform you of the risks and possible complications associated with treatments offered to you. It is your responsibility as a patient to read this and to ask questions about anything that is not clear or that you believe was not covered in this document.  Patient's Rights: You have the right to refuse treatment. You also have the right to change your mind, even after initially having agreed to have the treatment done. However, under this last option, if you wait until the last second to change your mind, you may be charged for the materials used up to that point.  Introduction: Medicine is not an exact science. Everything in Medicine, including the lack of treatment(s), carries the potential for danger, harm, or loss (which is by definition: Risk). In Medicine, a complication is a secondary problem, condition, or disease that can aggravate an already existing one. All treatments carry the risk of possible complications. The fact that a side effects or complications occurs, does not imply that the treatment was conducted incorrectly. It must be clearly understood that these can happen even when everything is done following the highest safety standards.  No treatment: You can choose not to proceed with the proposed treatment alternative. The "PRO(s)" would include: avoiding the risk of complications associated with the therapy. The "CON(s)" would include: not getting any of the treatment benefits. These benefits fall under one of three categories: diagnostic; therapeutic; and/or palliative. Diagnostic benefits include: getting information which can ultimately lead to improvement of the disease or symptom(s). Therapeutic benefits are those associated with the  successful treatment of the disease. Finally, palliative benefits are those related to the decrease of the primary symptoms, without necessarily curing the condition (example: decreasing the pain from a flare-up of a chronic condition, such as incurable terminal cancer).  General Risks and Complications: These are associated to most interventional treatments. They can occur alone, or in combination. They fall under one of the following six (6) categories: no benefit or worsening of symptoms; bleeding; infection; nerve damage; allergic reactions; and/or death. No benefits or worsening of symptoms: In Medicine there are no guarantees, only probabilities. No healthcare provider can ever guarantee that a medical treatment will work, they can only state the probability that it may. Furthermore, there is always the possibility that the condition may worsen, either directly, or indirectly, as a consequence of the treatment. Bleeding: This is more common if the patient is taking a blood thinner, either prescription or over the counter (example: Goody Powders, Fish oil, Aspirin, Garlic, etc.), or if suffering a condition associated with impaired coagulation (example: Hemophilia, cirrhosis of the liver, low platelet counts, etc.). However, even if you do not have one on these, it can still happen. If you have any of these conditions, or take one of these drugs, make sure to notify your treating physician. Infection: This is more common in patients with a compromised immune system, either due to disease (example: diabetes, cancer, human immunodeficiency virus [HIV], etc.), or due to medications or treatments (example: therapies used to treat cancer and rheumatological diseases). However, even if you do not have one on these, it can still happen. If you have any of these conditions, or take one of these drugs, make sure to notify your treating physician. Nerve Damage: This is more common when the treatment is an invasive    one, but it can also happen with the use of medications, such as those used in the treatment of cancer. The damage can occur to small secondary nerves, or to large primary ones, such as those in the spinal cord and brain. This damage may be temporary or permanent and it may lead to impairments that can range from temporary numbness to permanent paralysis and/or brain death. Allergic Reactions: Any time a substance or material comes in contact with our body, there is the possibility of an allergic reaction. These can range from a mild skin rash (contact dermatitis) to a severe systemic reaction (anaphylactic reaction), which can result in death. Death: In general, any medical intervention can result in death, most of the time due to an unforeseen complication. ____________________________________________________________________________________________ Knee Injection A knee injection is a procedure to get medicine into your knee joint to relieve the pain, swelling, and stiffness of arthritis. Your health care provider uses a needle to inject medicine, which may also help to lubricate and cushion your knee joint. You may need more than one injection. Tell a health care provider about: Any allergies you have. All medicines you are taking, including vitamins, herbs, eye drops, creams, and over-the-counter medicines. Any problems you or family members have had with anesthetic medicines. Any blood disorders you have. Any surgeries you have had. Any medical conditions you have. Whether you are pregnant or may be pregnant. What are the risks? Generally, this is a safe procedure. However, problems may occur, including: Infection. Bleeding. Symptoms that get worse. Damage to the area around your knee. Allergic reaction to any of the medicines. Skin reactions from repeated injections. What happens before the procedure? Ask your health care provider about: Changing or stopping your regular medicines. This  is especially important if you are taking diabetes medicines or blood thinners. Taking medicines such as aspirin and ibuprofen. These medicines can thin your blood. Do not take these medicines unless your health care provider tells you to take them. Taking over-the-counter medicines, vitamins, herbs, and supplements. Plan to have a responsible adult take you home from the hospital or clinic. What happens during the procedure?  You will sit or lie down in a position for your knee to be treated. The skin over your kneecap will be cleaned with a germ-killing soap. You will be given a medicine that numbs the area (local anesthetic). You may feel some stinging. The medicine will be injected into your knee. The needle is carefully placed between your kneecap and your knee. The medicine is injected into the joint space. The needle will be removed at the end of the procedure. A bandage (dressing) may be placed over the injection site. The procedure may vary among health care providers and hospitals. What can I expect after the procedure? Your blood pressure, heart rate, breathing rate, and blood oxygen level will be monitored until you leave the hospital or clinic. You may have to move your knee through its full range of motion. This helps to get all the medicine into your joint space. You will be watched to make sure that you do not have a reaction to the injected medicine. You may feel more pain, swelling, and warmth than you did before the injection. This reaction may last about 1-2 days. Follow these instructions at home: Medicines Take over-the-counter and prescription medicines only as told by your health care provider. Ask your health care provider if the medicine prescribed to you requires you to avoid driving or using machinery. Do not  take medicines such as aspirin and ibuprofen unless your health care provider tells you to take them. Injection site care Follow instructions from your health  care provider about: How to take care of your puncture site. When and how you should change your dressing. When you should remove your dressing. Check your injection area every day for signs of infection. Check for: More redness, swelling, or pain after 2 days. Fluid or blood. Pus or a bad smell. Warmth. Managing pain, stiffness, and swelling  If directed, put ice on the injection area. To do this: Put ice in a plastic bag. Place a towel between your skin and the bag. Leave the ice on for 20 minutes, 2-3 times per day. Remove the ice if your skin turns bright red. This is very important. If you cannot feel pain, heat, or cold, you have a greater risk of damage to the area. Do not apply heat to your knee. Raise (elevate) the injection area above the level of your heart while you are sitting or lying down. General instructions If you were given a dressing, keep it dry until your health care provider says it can be removed. Ask your health care provider when you can start showering or bathing. Avoid strenuous activities for as long as directed by your health care provider. Ask your health care provider when you can return to your normal activities. Keep all follow-up visits. This is important. You may need more injections. Contact a health care provider if you have: A fever. Warmth in your injection area. Fluid, blood, or pus coming from your injection site. Symptoms at your injection site that last longer than 2 days after your procedure. Get help right away if: Your knee turns very red. Your knee becomes very swollen. Your knee is in severe pain. Summary A knee injection is a procedure to get medicine into your knee joint to relieve the pain, swelling, and stiffness of arthritis. A needle is carefully placed between your kneecap and your knee to inject medicine into the joint space. Before the procedure, ask your health care provider about changing or stopping your regular medicines,  especially if you are taking diabetes medicines or blood thinners. Contact your health care provider if you have any problems or questions after your procedure. This information is not intended to replace advice given to you by your health care provider. Make sure you discuss any questions you have with your health care provider. Document Revised: 02/02/2020 Document Reviewed: 02/02/2020 [employer] Patient Education  2022 [employer].

## 2021-05-19 NOTE — Progress Notes (Signed)
PROVIDER NOTE: Interpretation of information contained herein should be left to medically-trained personnel. Specific patient instructions are provided elsewhere under "Patient Instructions" section of medical record. This document was created in part using STT-dictation technology, any transcriptional errors that may result from this process are unintentional.  Patient: Sharon Holden Type: Established DOB: 02-02-1961 MRN: [id number] PCP: Marguerita Merles, MD  Service: Procedure DOS: 05/22/2021 Setting: Ambulatory Location: Ambulatory outpatient facility Delivery: Face-to-face Provider: Gaspar Cola, MD Specialty: Interventional Pain Management Specialty designation: 09 Location: Outpatient facility Ref. Prov.: Marguerita Merles, MD   Procedure [facility] Interventional Pain Management )    Procedure: ER-steroid Knee Injection  Laterality: Right (-RT) Level: Intra-articular  No.: 1 Series: 1 Purpose: Diagnostic/Therapeutic Indications: Knee arthralgia associated to osteoarthritis of the knee   Imaging: None required ([id number]) Analgesia: Skin infiltration w/ local anesthetics Sedation: None  NAS-11 score:   Pre-procedure: 7 /10   Post-procedure: 0-No pain/10      1. Chronic knee pain (3ry area of Pain) (Bilateral) (R>L)   2. Osteoarthritis of knee (Right)   3. Osteoarthritis of patellofemoral joint (Right)   4. Morbid obesity with BMI of 50.0-59.9, adult [facility])    Pre-Procedure Preparation  Monitoring: As per clinic protocol.  Risk Assessment: Vitals:  [id number] body mass index is 51.39 kg/m as calculated from the following:   Height as of this encounter: '5\' 8"'$  (1.727 m).   Weight as of this encounter: 338 lb (153.3 kg)., Rate:75 , BP:(!) 161/90, Resp:18, Temp:(!) 97.2 F (36.2 C), SpO2:100 %  Allergies: She has No Known Allergies.  Precautions: None required  Blood-thinner(s): None at this time  Coagulopathies: Reviewed. None identified.   Active Infection(s):  Reviewed. None identified. Sharon Holden is afebrile   Location setting: Exam room Position: Sitting w/ knee bent 90 degrees Safety Precautions: Patient was assessed for positional comfort and pressure points before starting the procedure. Prepping solution: DuraPrep (Iodine Povacrylex [0.7% available iodine] and Isopropyl Alcohol, 74% w/w) Prep Area: Entire knee region Approach: percutaneous, just above the tibial plateau, lateral to the infrapatellar tendon. Intended target: Intra-articular knee space Materials: Tray: Block Needle(s): Regular Qty: 1/side Length: 1.5-inch Gauge: 25G   Meds ordered this encounter  Medications   lidocaine (XYLOCAINE) 2 % (with pres) injection 400 mg   pentafluoroprop-tetrafluoroeth (GEBAUERS) aerosol   ropivacaine (PF) 2 mg/mL (0.2%) (NAROPIN) injection 5 mL   methylPREDNISolone acetate (DEPO-MEDROL) injection 80 mg   Triamcinolone Acetonide SRER 32 mg    Maintain refrigerated.  Prepared suspension may be stored up to 4 hours at ambient conditions.     Orders Placed This Encounter  Procedures   KNEE INJECTION    Local Anesthetic & Steroid injection.    Scheduling Instructions:     Side(s): Right Knee     Sedation: None     Timeframe: Today    Order Specific Question:   Where will this procedure be performed?    Answer:   [facility]   DG PAIN CLINIC C-ARM 1-60 MIN NO REPORT    Intraoperative interpretation by procedural physician at [place].    Standing Status:   Standing    Number of Occurrences:   1    Order Specific Question:   Reason for exam:    Answer:   Assistance in needle guidance and placement for procedures requiring needle placement in or near specific anatomical locations not easily accessible without such assistance.   Informed Consent Details: Physician/Practitioner Attestation; Transcribe to consent form and obtain  patient signature    Note: Always confirm laterality of pain with Sharon Holden, before  procedure. Transcribe to consent form and obtain patient signature.    Order Specific Question:   Physician/Practitioner attestation of informed consent for procedure/surgical case    Answer:   I, the physician/practitioner, attest that I have discussed with the patient the benefits, risks, side effects, alternatives, likelihood of achieving goals and potential problems during recovery for the procedure that I have provided informed consent.    Order Specific Question:   Procedure    Answer:   Right-sided intra-articular knee arthrocentesis (aspiration and/or injection)    Order Specific Question:   Physician/Practitioner performing the procedure    Answer:   Hanish Laraia A. Dossie Arbour, MD    Order Specific Question:   Indication/Reason    Answer:   Chronic right-sided knee pain secondary to knee arthropathy/arthralgia   Provide equipment / supplies at bedside    "Block Tray" (Disposable  single use) Needle type: SpinalSpinal Amount/quantity: 1 Size: Regular (3.5-inch) Gauge: 22G    Standing Status:   Standing    Number of Occurrences:   1    Order Specific Question:   Specify    Answer:   Block Tray      Time-out: [id number] I initiated and conducted the "Time-out" before starting the procedure, as per protocol. The patient was asked to participate by confirming the accuracy of the "Time Out" information. Verification of the correct person, site, and procedure were performed and confirmed by me, the nursing staff, and the patient. "Time-out" conducted as per Joint Commission's Universal Protocol (UP.01.01.01). Procedure checklist: Completed   H&P (Pre-op  Assessment)  Sharon Holden is a 60 y.o. (year old), female patient, seen today for interventional treatment. She  has a past surgical history that includes Gastric bypass; Cesarean section; Ureteroscopy with holmium laser lithotripsy (Left, 06/20/2016); and Cystoscopy with stent placement (Left, 06/20/2016). Sharon Holden has a current medication list  which includes the following prescription(s): vitamin d3, vitamin b-12, ergocalciferol, etodolac, furosemide, lisinopril-hydrochlorothiazide, meloxicam, and pantoprazole. Her primarily concern today is the Knee Pain (Right )  She has No Known Allergies.   Last encounter: My last encounter with her was on 05/02/2021. Pertinent problems: Sharon Holden has Chronic fatigue; Chronic pain syndrome; Chronic knee pain (3ry area of Pain) (Bilateral) (R>L); DDD (degenerative disc disease), thoracic; DDD (degenerative disc disease), lumbosacral; Chronic lower extremity pain (2ry area of Pain) (Bilateral) (R>L); Chronic low back pain (1ry area of Pain) (Bilateral) (R>L) w/o sciatica; Osteoarthritis of knee (Right); and Osteoarthritis of patellofemoral joint (Right) on their pertinent problem list. Pain Assessment: Severity of Chronic pain is reported as a 7 /10. Location: Knee Right/sometimes the knee pain will compromise the entire leg.. Onset: More than a month ago. Quality: Aching, Discomfort, Constant. Timing: Constant. Modifying factor(s): medications. Vitals:  height is '5\' 8"'$  (1.727 m) and weight is 338 lb (153.3 kg) (abnormal). Her temperature is 97.2 F (36.2 C) (abnormal). Her blood pressure is 173/82 (abnormal) and her pulse is 71. Her respiration is 16 and oxygen saturation is 98%.   Reason for encounter: "interventional pain management therapy due pain of at least four (4) weeks in duration, with failure to respond and/or inability to tolerate more conservative care.    Related imaging: Knee-R DG 4 views:  Results for orders placed during the hospital encounter of 05/02/21 DG Knee Complete 4 Views Right  Narrative CLINICAL DATA:  Chronic knee pain  EXAM: RIGHT KNEE - COMPLETE 4+ VIEW; LEFT KNEE -  COMPLETE 4+ VIEW  COMPARISON:  None.  FINDINGS: No fracture or dislocation of the bilateral knees. 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 No knee joint effusion. Soft tissues are unremarkable.  IMPRESSION: 1. No fracture or dislocation of the bilateral knees. 2. 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   Electronically Signed By: Eddie Candle M.D. On: 05/04/2021 14:59  Knee-L DG 4 views:  Results for orders placed during the hospital encounter of 05/02/21 DG Knee Complete 4 Views Left  Narrative CLINICAL DATA:  Chronic knee pain  EXAM: RIGHT KNEE - COMPLETE 4+ VIEW; LEFT KNEE - COMPLETE 4+ VIEW  COMPARISON:  None.  FINDINGS: No fracture or dislocation of the bilateral knees. 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 No knee joint effusion. Soft tissues are unremarkable.  IMPRESSION: 1. No fracture or dislocation of the bilateral knees. 2. 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   Electronically Signed By: Eddie Candle M.D. On: 05/04/2021 14:59    Site Confirmation: Sharon Holden was asked to confirm the procedure and laterality before marking the site.  Consent: Before the procedure and under the influence of no sedative(s), amnesic(s), or anxiolytics, the patient was informed of the treatment options, risks and possible complications. To fulfill our ethical and legal obligations, as recommended by the American Medical Association's Code of Ethics, I have informed the patient of my clinical impression; the nature and  purpose of the treatment or procedure; the risks, benefits, and possible complications of the intervention; the alternatives, including doing nothing; the risk(s) and benefit(s) of the alternative treatment(s) or procedure(s); and the risk(s) and benefit(s) of doing nothing. The patient was provided information about the general risks and possible complications associated with the procedure. These may include, but are not limited to: failure to achieve desired goals, infection, bleeding, organ or nerve damage, allergic reactions, paralysis, and death. In addition, the patient was informed of those risks and complications associated to Spine-related procedures, such as failure to decrease pain; infection (i.e.: Meningitis, epidural or intraspinal abscess); bleeding (i.e.: epidural hematoma, subarachnoid hemorrhage, or any other type of intraspinal or peri-dural bleeding); organ or nerve damage (i.e.: Any type of peripheral nerve, nerve root, or spinal cord injury) with subsequent damage to sensory, motor, and/or autonomic systems, resulting in permanent pain, numbness, and/or weakness of one or several areas of the body; allergic reactions; (i.e.: anaphylactic reaction); and/or death. Furthermore, the patient was informed of those risks and complications associated with the medications. These include, but are not limited to: allergic reactions (i.e.: anaphylactic or anaphylactoid reaction(s)); adrenal axis suppression; blood sugar elevation that in diabetics may result in ketoacidosis or comma; water retention that in patients with history of congestive heart failure may result in shortness of breath, pulmonary edema, and decompensation with resultant heart failure; weight gain; swelling or edema; medication-induced neural toxicity; particulate matter embolism and blood vessel occlusion with resultant organ, and/or nervous system infarction; and/or aseptic necrosis of one or  more joints. Finally, the patient was  informed that Medicine is not an exact science; therefore, there is also the possibility of unforeseen or unpredictable risks and/or possible complications that may result in a catastrophic outcome. The patient indicated having understood very clearly. We have given the patient no guarantees and we have made no promises. Enough time was given to the patient to ask questions, all of which were answered to the patient's satisfaction. Sharon Holden has indicated that she wanted to continue with the procedure. Attestation: I, the ordering provider, attest that I have discussed with the patient the benefits, risks, side-effects, alternatives, likelihood of achieving goals, and potential problems during recovery for the procedure that I have provided informed consent.  Date  Time: 05/22/2021 11:50 AM   Prophylactic antibiotics  Anti-infectives (From admission, onward)    None      Indication(s): None identified   Description of procedure   Start Time: 1225 hrs  Local Anesthesia: Once the patient was positioned, prepped, and time-out was completed. The target area was identified located. The skin was marked with an approved surgical skin marker. Once marked, the skin (epidermis, dermis, and hypodermis), and deeper tissues (fat, connective tissue and muscle) were infiltrated with a small amount of a short-acting local anesthetic, loaded on a 10cc syringe with a 25G, 1.5-in  Needle. An appropriate amount of time was allowed for local anesthetics to take effect before proceeding to the next step. Local Anesthetic: Lidocaine 1-2% The unused portion of the local anesthetic was discarded in the proper designated containers. Safety Precautions: Aspiration looking for blood return was conducted prior to all injections. At no point did I inject any substances, as a needle was being advanced. Before injecting, the patient was told to immediately notify me if she was experiencing any new onset of "ringing in the  ears, or metallic taste in the mouth". No attempts were made at seeking any paresthesias. Safe injection practices and needle disposal techniques used. Medications properly checked for expiration dates. SDV (single dose vial) medications used. After the completion of the procedure, all disposable equipment used was discarded in the proper designated medical waste containers.  Technical description: Protocol guidelines were followed. After positioning, the target area was identified and prepped in the usual manner. Skin & deeper tissues infiltrated with local anesthetic. Appropriate amount of time allowed to pass for local anesthetics to take effect. Proper needle placement secured. Once satisfactory needle placement was confirmed, I proceeded to inject the desired solution in slow, incremental fashion, intermittently assessing for discomfort or any signs of abnormal or undesired spread of substance. Once completed, the needle was removed and disposed of, as per hospital protocols. The area was cleaned, making sure to leave some of the prepping solution back to take advantage of its long term bactericidal properties.  Aspiration:  Negative   Vitals:   05/22/21 1147 05/22/21 1237  BP: (!) 161/90 (!) 173/82  Pulse: 75 71  Resp: 18 16  Temp: (!) 97.2 F (36.2 C)   SpO2: 100% 98%  Weight: (!) 338 lb (153.3 kg)   Height: '5\' 8"'$  (1.727 m)     End Time: 1232 hrs   Imaging guidance  Imaging-assisted Technique: Fluoroscopy Guidance (Non-spinal) Indication(s): Morbid obesity. Assistance in needle guidance and placement for procedures requiring needle placement in or near specific anatomical locations impossible to access without such assistance. Exposure Time: Please see nurses notes. Contrast: None Fluoroscopic Guidance: I was personally present in the fluoroscopy suite, where the patient was  placed in position for the procedure, over the fluoroscopy-compatible table. Fluoroscopy was manipulated, using  "Tunnel Vision Technique", to obtain the best possible view of the target area, on the affected side. Parallax error was corrected before commencing the procedure. A "direction-depth-direction" technique was used to introduce the needle under continuous pulsed fluoroscopic guidance. Once the target was reached, antero-posterior, oblique, and lateral fluoroscopic projection views were taken to confirm needle placement in all planes. Electronic images uploaded into EMR. Ultrasound Guidance: N/A Interpretation: Intraoperative imaging interpretation by performing Physician.   Post-op assessment  Post-procedure Vital Signs:  Pulse/HCG Rate: 71  Temp: (!) 97.2 F (36.2 C) Resp: 16 BP: (!) 173/82 SpO2: 98 %  EBL: None  Complications: No immediate post-treatment complications observed by team, or reported by patient.  Note: The patient tolerated the entire procedure well. A repeat set of vitals were taken after the procedure and the patient was kept under observation following institutional policy, for this type of procedure. Post-procedural neurological assessment was performed, showing return to baseline, prior to discharge. The patient was provided with post-procedure discharge instructions, including a section on how to identify potential problems. Should any problems arise concerning this procedure, the patient was given instructions to immediately contact us, at any time, without hesitation. In any case, we plan to contact the patient by telephone for a follow-up status report regarding this interventional procedure.  Comments:  No additional relevant information.   Plan of care  Chronic Opioid Analgesic:  None MME/day: 0 mg/day    Medications administered: We administered lidocaine, pentafluoroprop-tetrafluoroeth, ropivacaine (PF) 2 mg/mL (0.2%), methylPREDNISolone acetate, and Triamcinolone Acetonide.  Follow-up plan:   Return in about 2 weeks (around 06/05/2021) for Proc-day (T,Th),  (VV), (PPE).      Interventional Therapies  Risk  Complexity Considerations:   Estimated body mass index is 52.46 kg/m as calculated from the following:   Height as of this encounter: '5\' 8"'$  (1.727 m).   Weight as of this encounter: 345 lb (156.5 kg). WNL   Planned  Pending:   Diagnostic/therapeutic right IA ER steroid (Zilretta) knee injection #1 under fluoroscopic guidance    Under consideration:   Diagnostic bilateral lumbar facet block #1  Therapeutic right IA Zilretta knee injection #1  Therapeutic right IA Monovisc knee injection #1    Completed:   None at this time   Therapeutic  Palliative (PRN) options:   None established      Recent Visits Date Type Provider Dept  05/02/21 Office Visit Milinda Pointer, MD [facility]  03/14/21 Office Visit Milinda Pointer, MD [facility]  Showing recent visits within past 90 days and meeting all other requirements Today's Visits Date Type Provider Dept  05/22/21 Procedure visit Milinda Pointer, MD [facility]  Showing today's visits and meeting all other requirements Future Appointments Date Type Provider Dept  06/07/21 Appointment Milinda Pointer, MD [facility]  Showing future appointments within next 90 days and meeting all other requirements  Disposition: Discharge home  Discharge (Date  Time): 05/22/2021; 1240 hrs.   Primary Care Physician: Marguerita Merles, MD Location: [facility] Note by: Gaspar Cola, MD Date: 05/22/2021; Time: 12:51 PM  DISCLAIMER: Medicine is not an Chief Strategy Officer. It has no guarantees or warranties. The decision to proceed with this intervention was based on the information collected from the patient. Conclusions were drawn from the patient's questionnaire, interview, and examination. Because information was provided in large part by the patient, it cannot  be guaranteed that it has not been purposely or  unconsciously manipulated or altered. Every effort has been made to obtain as much accurate, relevant, available data as possible. Always take into account that the treatment will also be dependent on availability of resources and existing treatment guidelines, considered by other Pain Management Specialists as being common knowledge and practice, at the time of the intervention. It is also important to point out that variation in procedural techniques and pharmacological choices are the acceptable norm. For Medico-Legal review purposes, the indications, contraindications, technique, and results of the these procedures should only be evaluated, judged and interpreted by a Board-Certified Interventional Pain Specialist with extensive familiarity and expertise in the same exact procedure and technique.

## 2021-05-22 ENCOUNTER — Ambulatory Visit
Admission: RE | Admit: 2021-05-22 | Discharge: 2021-05-22 | Disposition: A | Discharge status: Home / Self Care | Payer: BC Managed Care – PPO | Source: Ambulatory Visit | Attending: Pain Medicine | Admitting: Pain Medicine

## 2021-05-22 ENCOUNTER — Other Ambulatory Visit: Payer: Self-pay

## 2021-05-22 ENCOUNTER — Ambulatory Visit (HOSPITAL_BASED_OUTPATIENT_CLINIC_OR_DEPARTMENT_OTHER): Payer: BC Managed Care – PPO | Admitting: Pain Medicine

## 2021-05-22 ENCOUNTER — Encounter: Payer: Self-pay | Admitting: Pain Medicine

## 2021-05-22 VITALS — BP 173/82 | HR 71 | Temp 97.2°F | Resp 16 | Ht 68.0 in | Wt 338.0 lb

## 2021-05-22 DIAGNOSIS — M25561 Pain in right knee: Secondary | ICD-10-CM | POA: Diagnosis not present

## 2021-05-22 DIAGNOSIS — M25562 Pain in left knee: Secondary | ICD-10-CM | POA: Insufficient documentation

## 2021-05-22 DIAGNOSIS — M1711 Unilateral primary osteoarthritis, right knee: Secondary | ICD-10-CM | POA: Insufficient documentation

## 2021-05-22 DIAGNOSIS — G8929 Other chronic pain: Secondary | ICD-10-CM | POA: Diagnosis not present

## 2021-05-22 DIAGNOSIS — Z6841 Body Mass Index (BMI) 40.0 and over, adult: Secondary | ICD-10-CM | POA: Insufficient documentation

## 2021-05-22 MED ORDER — ROPIVACAINE HCL 2 MG/ML IJ SOLN
5.0000 mL | Freq: Once | INTRAMUSCULAR | Status: AC
  Administered 2021-05-22: 20 mL via INTRA_ARTICULAR

## 2021-05-22 MED ORDER — ROPIVACAINE HCL 2 MG/ML IJ SOLN
INTRAMUSCULAR | Status: AC
  Filled 2021-05-22: qty 20

## 2021-05-22 MED ORDER — METHYLPREDNISOLONE ACETATE 80 MG/ML IJ SUSP
80.0000 mg | Freq: Once | INTRAMUSCULAR | Status: AC
Start: 2021-05-22 — End: 2021-05-22
  Administered 2021-05-22: 80 mg via INTRA_ARTICULAR
  Filled 2021-05-22: qty 1

## 2021-05-22 MED ORDER — LIDOCAINE HCL (PF) 2 % IJ SOLN
INTRAMUSCULAR | Status: AC
  Filled 2021-05-22: qty 5

## 2021-05-22 MED ORDER — LIDOCAINE HCL 2 % IJ SOLN
20.0000 mL | Freq: Once | INTRAMUSCULAR | Status: AC
Start: 2021-05-22 — End: 2021-05-22
  Administered 2021-05-22: 100 mg
  Filled 2021-05-22: qty 20

## 2021-05-22 MED ORDER — TRIAMCINOLONE ACETONIDE 32 MG IX SRER
32.0000 mg | Freq: Once | INTRA_ARTICULAR | Status: AC
  Administered 2021-05-22: 32 mg via INTRA_ARTICULAR
  Filled 2021-05-22: qty 5

## 2021-05-22 MED ORDER — PENTAFLUOROPROP-TETRAFLUOROETH EX AERO
INHALATION_SPRAY | Freq: Once | CUTANEOUS | Status: AC
  Filled 2021-05-22: qty 116

## 2021-05-22 NOTE — Progress Notes (Signed)
Safety precautions to be maintained throughout the outpatient stay will include: orient to surroundings, keep bed in low position, maintain call bell within reach at all times, provide assistance with transfer out of bed and ambulation.  

## 2021-05-22 NOTE — Patient Instructions (Signed)

## 2021-05-23 ENCOUNTER — Telehealth: Payer: Self-pay

## 2021-05-23 NOTE — Telephone Encounter (Signed)
Post procedure phone call.  Patient states she is doing well.  

## 2021-05-29 ENCOUNTER — Other Ambulatory Visit: Payer: Self-pay | Admitting: Pain Medicine

## 2021-05-29 DIAGNOSIS — E559 Vitamin D deficiency, unspecified: Secondary | ICD-10-CM

## 2021-05-29 DIAGNOSIS — R5382 Chronic fatigue, unspecified: Secondary | ICD-10-CM

## 2021-06-06 NOTE — Progress Notes (Signed)
Patient: Sharon Holden  Service Category: E/M  Provider: Gaspar Cola, MD  DOB: [birth year]  DOS: 06/07/2021  Location: Office  MRN: [id number]  Setting: Ambulatory outpatient  Referring Provider: Marguerita Merles, MD  Type: Established Patient  Specialty: Interventional Pain Management  PCP: Marguerita Merles, MD  Location: Remote location  Delivery: TeleHealth     Virtual Encounter - Pain Management PROVIDER NOTE: Information contained herein reflects review and annotations entered in association with encounter. Interpretation of such information and data should be left to medically-trained personnel. Information provided to patient can be located elsewhere in the medical record under "Patient Instructions". Document created using STT-dictation technology, any transcriptional errors that may result from process are unintentional.    Contact & Pharmacy Preferred: [phone] Home: [phone] (home) Mobile: [phone] (mobile) E-mail: [email or web address]  [id number] [id number]: [phone]: [phone]  Pre-screening  Sharon Holden offered "in-person" vs "virtual" encounter. She indicated preferring virtual for this encounter.   Reason COVID-19*  Social distancing based on CDC and AMA recommendations.   I contacted BFareedah MahlerRankin on 06/07/2021 via telephone.      I clearly identified myself as FGaspar Cola MD. I verified that I was speaking with the correct person using two identifiers (Name: Sharon Holden and date of birth: [birth year].  Consent I sought verbal advanced consent from [name] for virtual visit interactions. I informed Sharon Holden of possible security and privacy concerns, risks, and limitations associated with providing "not-in-person" medical evaluation and management services. I also informed Sharon Holden of the availability of "in-person" appointments. Finally, I  informed her that there would be a charge for the virtual visit and that she could be  personally, fully or partially, financially responsible for it. Ms. RHanoverexpressed understanding and agreed to proceed.   Historic Elements   Ms. BALVENA KIERNANis a 60y.o. year old, female patient evaluated today after our last contact on 05/29/2021. Ms. RFajardo has a past medical history of Arthritis, History of kidney stones, Hypertension, Kidney stones, and Sleep apnea. She also  has a past surgical history that includes Gastric bypass; Cesarean section; Ureteroscopy with holmium laser lithotripsy (Left, 06/20/2016); and Cystoscopy with stent placement (Left, 06/20/2016). Ms. RLoomishas a current medication list which includes the following prescription(s): vitamin d3, ergocalciferol, etodolac, furosemide, lisinopril-hydrochlorothiazide, and pantoprazole. She  reports that she has never smoked. She has never used smokeless tobacco. She reports current alcohol use. She reports that she does not use drugs. Ms. RSobieskihas No Known Allergies.   HPI  Today, she is being contacted for a post-procedure assessment.  The patient indicates having done great with the Zilretta knee injection.  She describes having an ongoing 100% relief of the right knee pain.  At this point, she wants uKoreato turn her attention to her low back pain.  She refers that the primary pain is in the lower portion of her back, bilaterally, with some pain being referred towards the buttocks area but no lower extremity pain.  I have talked to the patient about this and we have decided to proceed with a diagnostic bilateral lumbar facet block under fluoroscopic guidance and IV anxiolysis.  She is currently on some oral steroids that were started by her PCP as a taper.  As soon as she is done with that, then we can consider doing the facet blocks.  I  would prefer if we could schedule them about 2 weeks after she gets done with the opioid  taper.  Post-Procedure Evaluation  Procedure (05/22/2021):  Procedure [facility] )  Procedures Procedure: ER-steroid Knee Injection  Laterality: Right (-RT) Level: Intra-articular  No.: 1 Series: 1 Purpose: Diagnostic/Therapeutic Indications: Knee arthralgia associated to osteoarthritis of the knee    Imaging: None required (CPT-20610) Analgesia: Skin infiltration w/ local anesthetics Sedation: None   NAS-11 score:          Pre-procedure: 7 /10          Post-procedure: 0-No pain/10     1. Chronic knee pain (3ry area of Pain) (Bilateral) (R>L)   2. Osteoarthritis of knee (Right)   3. Osteoarthritis of patellofemoral joint (Right)   4. Morbid obesity with BMI of 50.0-59.9, adult (HCC)    Anxiolysis: none.  Effectiveness during initial hour after procedure (Ultra-Short Term Relief): 100 %.  Local anesthetic used: Long-acting (4-6 hours) Effectiveness: Defined as any analgesic benefit obtained secondary to the administration of local anesthetics. This carries significant diagnostic value as to the etiological location, or anatomical origin, of the pain. Duration of benefit is expected to coincide with the duration of the local anesthetic used.  Effectiveness during initial 4-6 hours after procedure (Short-Term Relief): 100 % (numb for approx 4 hours. then the pain gradually began to come back.).  Long-term benefit: Defined as any relief past the pharmacologic duration of the local anesthetics.  Effectiveness past the initial 6 hours after procedure (Long-Term Relief): 100 % (when walking the pain is relieved and knee feels good.).  Benefits, current: Defined as benefit present at the time of this evaluation.   Analgesia: The patient is currently enjoying a 100% ongoing relief of the right knee pain. Function: Sharon Holden reports improvement in function ROM: Sharon Holden reports improvement in ROM  Pharmacotherapy Assessment   Analgesic: None MME/day: 0  mg/day    Monitoring: [place] PMP: PDMP reviewed during this encounter.       Pharmacotherapy: No side-effects or adverse reactions reported. Compliance: No problems identified. Effectiveness: Clinically acceptable. Plan: Refer to "POC". UDS:  Summary  Date Value Ref Range Status  03/14/2021 Note  Final    Comment:    ==================================================================== Compliance Drug Analysis, Ur ==================================================================== Test                             Result       Flag       Units    NO DRUGS DETECTED. ==================================================================== Test                      Result    Flag   Units      Ref Range   Creatinine              183              mg/dL      >=20 ==================================================================== Declared Medications:  The flagging and interpretation on this report are based on the  following declared medications.  Unexpected results may arise from  inaccuracies in the declared medications.   **Note: The testing scope of this panel does not include the  following reported medications:   Etodolac  Furosemide  Hydrochlorothiazide (Zestoretic)  Lisinopril (Zestoretic)  Pantoprazole ==================================================================== For clinical consultation, please call [phone]. ====================================================================      Laboratory Chemistry Profile   Renal Lab Results  Component Value Date   BUN 12 03/14/2021   CREATININE 0.98 03/14/2021   GFRAA >60 02/03/2017   GFRNONAA >60 03/14/2021    Hepatic Lab Results  Component Value Date   AST 13 (L) 03/14/2021   ALT 13 03/14/2021   ALBUMIN 4.2 03/14/2021   ALKPHOS 95 03/14/2021   LIPASE 30 02/03/2017    Electrolytes Lab Results  Component Value Date   NA 139 03/14/2021   K 3.8 03/14/2021   CL 104 03/14/2021   CALCIUM 9.3 03/14/2021    MG 1.9 03/14/2021    Bone Lab Results  Component Value Date   VD25OH 8.44 (L) 03/14/2021    Inflammation (CRP: Acute Phase) (ESR: Chronic Phase) Lab Results  Component Value Date   CRP 0.6 03/14/2021   ESRSEDRATE 7 03/14/2021         Note: Above Lab results reviewed.  Imaging  DG PAIN CLINIC C-ARM 1-60 MIN NO REPORT Fluoro was used, but no Radiologist interpretation will be provided.  Please refer to "NOTES" tab for provider progress note.  Assessment  The primary encounter diagnosis was Chronic low back pain (1ry area of Pain) (Bilateral) (R>L) w/o sciatica. Diagnoses of Lumbar facet syndrome (Bilateral), Chronic knee pain (3ry area of Pain) (Bilateral) (R>L), Osteoarthritis of knee (Right), and Morbid obesity with BMI of 50.0-59.9, adult [facility]) were also pertinent to this visit.  Plan of Care  Problem-specific:  No problem-specific Assessment & Plan notes found for this encounter.  Ms. KOURTLYNN TREVOR has a current medication list which includes the following long-term medication(s): furosemide, lisinopril-hydrochlorothiazide, and pantoprazole.  Pharmacotherapy (Medications Ordered): No orders of the defined types were placed in this encounter.  Orders:  Orders Placed This Encounter  Procedures   LUMBAR FACET(MEDIAL BRANCH NERVE BLOCK) MBNB    Standing Status:   Future    Standing Expiration Date:   09/07/2021    Scheduling Instructions:     Procedure: Lumbar facet block (AKA.: Lumbosacral medial branch nerve block)     Side: Bilateral     Level: L3-4, L4-5, & L5-S1 Facets (L2, L3, L4, L5, & S1 Medial Branch Nerves)     Sedation: Patient's choice.     Timeframe: ASAA    Order Specific Question:   Where will this procedure be performed?    Answer:   [facility]    Follow-up plan:   Return for (Clinic) procedure: (B) L-FCT BLK #1, (Sed-anx).      Interventional Therapies  Risk  Complexity Considerations:   Estimated body mass index is 52.46 kg/m as  calculated from the following:   Height as of this encounter: _0  (1.727 m).   Weight as of this encounter: 345 lb (156.5 kg). WNL   Planned  Pending:   Diagnostic/therapeutic right IA ER steroid (Zilretta) knee injection #1 under fluoroscopic guidance    Under consideration:   Diagnostic bilateral lumbar facet MBB #1  Therapeutic right IA Monovisc knee injection #1    Completed:   Diagnostic/therapeutic right IA Zilretta knee injection x1 (05/22/2021) (100/100/100/100)    Therapeutic  Palliative (PRN) options:   None established    Recent Visits Date Type Provider Dept  05/22/21 Procedure visit Milinda Pointer, MD [facility]  05/02/21 Office Visit Milinda Pointer, MD [facility]  03/14/21 Office Visit Milinda Pointer, MD [facility]  Showing recent visits within past 90 days and meeting all other requirements Today's Visits Date Type Provider Dept  06/07/21 Office Visit Milinda Pointer, MD [facility]  Showing today's visits and meeting all other requirements Future Appointments No visits were found meeting these conditions. Showing future appointments within next 90 days and meeting all other requirements I discussed the assessment and treatment plan with the patient. The patient was provided an opportunity to ask questions and all were answered. The patient agreed with the plan and demonstrated an understanding of the instructions.  Patient advised to call back or seek an in-person evaluation if the symptoms or condition worsens.  Duration of encounter: 15 minutes.  Note by: Gaspar Cola, MD Date: 06/07/2021; Time: 12:30 PM

## 2021-06-07 ENCOUNTER — Ambulatory Visit: Payer: BLUE CROSS/BLUE SHIELD | Attending: Pain Medicine | Admitting: Pain Medicine

## 2021-06-07 ENCOUNTER — Other Ambulatory Visit: Payer: Self-pay

## 2021-06-07 DIAGNOSIS — M25561 Pain in right knee: Secondary | ICD-10-CM | POA: Diagnosis not present

## 2021-06-07 DIAGNOSIS — M545 Low back pain, unspecified: Secondary | ICD-10-CM | POA: Diagnosis not present

## 2021-06-07 DIAGNOSIS — M25562 Pain in left knee: Secondary | ICD-10-CM

## 2021-06-07 DIAGNOSIS — M1711 Unilateral primary osteoarthritis, right knee: Secondary | ICD-10-CM

## 2021-06-07 DIAGNOSIS — G8929 Other chronic pain: Secondary | ICD-10-CM

## 2021-06-07 DIAGNOSIS — M47816 Spondylosis without myelopathy or radiculopathy, lumbar region: Secondary | ICD-10-CM | POA: Diagnosis not present

## 2021-06-07 DIAGNOSIS — Z6841 Body Mass Index (BMI) 40.0 and over, adult: Secondary | ICD-10-CM

## 2021-06-07 NOTE — Patient Instructions (Signed)
______________________________________________________________________  Preparing for Procedure with Sedation  NOTICE: Due to recent regulatory changes, starting on April 02, 2021, procedures requiring intravenous (IV) sedation will no longer be performed at the [facility].  These types of procedures are required to be performed at [facility] ambulatory surgery facility.  We are very sorry for the inconvenience.  Procedure appointments are limited to planned procedures: No Prescription Refills. No disability issues will be discussed. No medication changes will be discussed.  Instructions: Oral Intake: Do not eat or drink anything for at least 8 hours prior to your procedure. (Exception: Blood Pressure Medication. See below.) Transportation: A driver is required. You may not drive yourself after the procedure. Blood Pressure Medicine: Do not forget to take your blood pressure medicine with a sip of water the morning of the procedure. If your Diastolic (lower reading) is above 100 mmHg, elective cases will be cancelled/rescheduled. Blood thinners: These will need to be stopped for procedures. Notify our staff if you are taking any blood thinners. Depending on which one you take, there will be specific instructions on how and when to stop it. Diabetics on insulin: Notify the staff so that you can be scheduled 1st case in the morning. If your diabetes requires high dose insulin, take only  of your normal insulin dose the morning of the procedure and notify the staff that you have done so. Preventing infections: Shower with an antibacterial soap the morning of your procedure. Build-up your immune system: Take 1000 mg of Vitamin C with every meal (3 times a day) the day prior to your procedure. Antibiotics: Inform the staff if you have a condition or reason that requires you to take antibiotics before dental procedures. Pregnancy: If you are pregnant, call and cancel the procedure. Sickness: If  you have a cold, fever, or any active infections, call and cancel the procedure. Arrival: You must be in the facility at least 30 minutes prior to your scheduled procedure. Children: Do not bring children with you. Dress appropriately: Bring dark clothing that you would not mind if they get stained. Valuables: Do not bring any jewelry or valuables.  Reasons to call and reschedule or cancel your procedure: (Following these recommendations will minimize the risk of a serious complication.) Surgeries: Avoid having procedures within 2 weeks of any surgery. (Avoid for 2 weeks before or after any surgery). Flu Shots: Avoid having procedures within 2 weeks of a flu shots. (Avoid for 2 weeks before or after immunizations). Barium: Avoid having a procedure within 7-10 days after having had a radiological study involving the use of radiological contrast. (Myelograms, Barium swallow or enema study). Heart attacks: Avoid any elective procedures or surgeries for the initial 6 months after a "Myocardial Infarction" (Heart Attack). Blood thinners: It is imperative that you stop these medications before procedures. Let us know if you if you take any blood thinner.  Infection: Avoid procedures during or within two weeks of an infection (including chest colds or gastrointestinal problems). Symptoms associated with infections include: Localized redness, fever, chills, night sweats or profuse sweating, burning sensation when voiding, cough, congestion, stuffiness, runny nose, sore throat, diarrhea, nausea, vomiting, cold or Flu symptoms, recent or current infections. It is specially important if the infection is over the area that we intend to treat. Heart and lung problems: Symptoms that may suggest an active cardiopulmonary problem include: cough, chest pain, breathing difficulties or shortness of breath, dizziness, ankle swelling, uncontrolled high or unusually low blood pressure, and/or palpitations. If you are    experiencing any of these symptoms, cancel your procedure and contact your primary care physician for an evaluation.  Remember:  Regular Business hours are:  Monday to Thursday 8:00 AM to 4:00 PM  Provider's Schedule: Kyra Laffey, MD:  Procedure days: Tuesday and Thursday 7:30 AM to 4:00 PM  Bilal Lateef, MD:  Procedure days: Monday and Wednesday 7:30 AM to 4:00 PM ______________________________________________________________________  ____________________________________________________________________________________________  General Risks and Possible Complications  Patient Responsibilities: It is important that you read this as it is part of your informed consent. It is our duty to inform you of the risks and possible complications associated with treatments offered to you. It is your responsibility as a patient to read this and to ask questions about anything that is not clear or that you believe was not covered in this document.  Patient's Rights: You have the right to refuse treatment. You also have the right to change your mind, even after initially having agreed to have the treatment done. However, under this last option, if you wait until the last second to change your mind, you may be charged for the materials used up to that point.  Introduction: Medicine is not an exact science. Everything in Medicine, including the lack of treatment(s), carries the potential for danger, harm, or loss (which is by definition: Risk). In Medicine, a complication is a secondary problem, condition, or disease that can aggravate an already existing one. All treatments carry the risk of possible complications. The fact that a side effects or complications occurs, does not imply that the treatment was conducted incorrectly. It must be clearly understood that these can happen even when everything is done following the highest safety standards.  No treatment: You can choose not to proceed with the  proposed treatment alternative. The "PRO(s)" would include: avoiding the risk of complications associated with the therapy. The "CON(s)" would include: not getting any of the treatment benefits. These benefits fall under one of three categories: diagnostic; therapeutic; and/or palliative. Diagnostic benefits include: getting information which can ultimately lead to improvement of the disease or symptom(s). Therapeutic benefits are those associated with the successful treatment of the disease. Finally, palliative benefits are those related to the decrease of the primary symptoms, without necessarily curing the condition (example: decreasing the pain from a flare-up of a chronic condition, such as incurable terminal cancer).  General Risks and Complications: These are associated to most interventional treatments. They can occur alone, or in combination. They fall under one of the following six (6) categories: no benefit or worsening of symptoms; bleeding; infection; nerve damage; allergic reactions; and/or death. No benefits or worsening of symptoms: In Medicine there are no guarantees, only probabilities. No healthcare provider can ever guarantee that a medical treatment will work, they can only state the probability that it may. Furthermore, there is always the possibility that the condition may worsen, either directly, or indirectly, as a consequence of the treatment. Bleeding: This is more common if the patient is taking a blood thinner, either prescription or over the counter (example: Goody Powders, Fish oil, Aspirin, Garlic, etc.), or if suffering a condition associated with impaired coagulation (example: Hemophilia, cirrhosis of the liver, low platelet counts, etc.). However, even if you do not have one on these, it can still happen. If you have any of these conditions, or take one of these drugs, make sure to notify your treating physician. Infection: This is more common in patients with a compromised  immune system, either due to disease (example:   diabetes, cancer, human immunodeficiency virus [HIV], etc.), or due to medications or treatments (example: therapies used to treat cancer and rheumatological diseases). However, even if you do not have one on these, it can still happen. If you have any of these conditions, or take one of these drugs, make sure to notify your treating physician. Nerve Damage: This is more common when the treatment is an invasive one, but it can also happen with the use of medications, such as those used in the treatment of cancer. The damage can occur to small secondary nerves, or to large primary ones, such as those in the spinal cord and brain. This damage may be temporary or permanent and it may lead to impairments that can range from temporary numbness to permanent paralysis and/or brain death. Allergic Reactions: Any time a substance or material comes in contact with our body, there is the possibility of an allergic reaction. These can range from a mild skin rash (contact dermatitis) to a severe systemic reaction (anaphylactic reaction), which can result in death. Death: In general, any medical intervention can result in death, most of the time due to an unforeseen complication. ____________________________________________________________________________________________  

## 2021-06-19 ENCOUNTER — Ambulatory Visit: Payer: BC Managed Care – PPO | Admitting: Pain Medicine

## 2021-06-26 ENCOUNTER — Ambulatory Visit: Payer: BC Managed Care – PPO | Admitting: Pain Medicine

## 2021-07-02 NOTE — Progress Notes (Deleted)
PROVIDER NOTE: Information contained herein reflects review and annotations entered in association with encounter. Interpretation of such information and data should be left to medically-trained personnel. Information provided to patient can be located elsewhere in the medical record under "Patient Instructions". Document created using STT-dictation technology, any transcriptional errors that may result from process are unintentional.    Patient: Sharon Holden  Service Category: Procedure Provider: Gaspar Cola, MD DOB: [birth year] DOS: 07/03/2021 Location: [facility] MRN: [id number] Setting: Ambulatory - outpatient Referring Provider: Milinda Pointer, MD Type: Established Patient Specialty: Interventional Pain Management PCP: Marguerita Merles, MD  Primary Reason for Visit: Interventional Pain Management Treatment. CC: No chief complaint on file.   Procedure:          Type: Lumbar Facet, Medial Branch Block(s)          Primary Purpose: Diagnostic/Therapeutic Region: Posterolateral Lumbosacral Spine Level: L2, L3, L4, L5, & S1 Medial Branch Level(s). Injecting these levels blocks the L3-4, L4-5, and L5-S1 lumbar facet joints. Laterality: Bilateral Anesthesia: Local (1-2% Lidocaine)  Anxiolysis: None  Sedation: None  Guidance: Fluoroscopy          Indications: No diagnosis found. Pain Score: Pre-procedure:  /10 Post-procedure:  /10    Position: Prone  Pre-op H&P Assessment:  Ms. Claus is a 60 y.o. (year old), female patient, seen today for interventional treatment. She  has a past surgical history that includes Gastric bypass; Cesarean section; Ureteroscopy with holmium laser lithotripsy (Left, 06/20/2016); and Cystoscopy with stent placement (Left, 06/20/2016). Ms. Caloca has a current medication list which includes the following prescription(s): vitamin d3, etodolac, furosemide, and lisinopril-hydrochlorothiazide. Her primarily concern today is the No chief  complaint on file.  Initial Vital Signs:  Pulse/HCG Rate:    Temp:   Resp:   BP:   SpO2:    BMI: Estimated body mass index is 51.39 kg/m as calculated from the following:   Height as of 05/22/21: 5\' 8"  (1.727 m).   Weight as of 05/22/21: 338 lb (153.3 kg).  Risk Assessment: Allergies: Reviewed. She has No Known Allergies.  Allergy Precautions: None required Coagulopathies: Reviewed. None identified.  Blood-thinner therapy: None at this time Active Infection(s): Reviewed. None identified. Ms. Olenick is afebrile  Site Confirmation: Ms. Genna was asked to confirm the procedure and laterality before marking the site Procedure checklist: Completed Consent: Before the procedure and under the influence of no sedative(s), amnesic(s), or anxiolytics, the patient was informed of the treatment options, risks and possible complications. To fulfill our ethical and legal obligations, as recommended by the American Medical Association's Code of Ethics, I have informed the patient of my clinical impression; the nature and purpose of the treatment or procedure; the risks, benefits, and possible complications of the intervention; the alternatives, including doing nothing; the risk(s) and benefit(s) of the alternative treatment(s) or procedure(s); and the risk(s) and benefit(s) of doing nothing. The patient was provided information about the general risks and possible complications associated with the procedure. These may include, but are not limited to: failure to achieve desired goals, infection, bleeding, organ or nerve damage, allergic reactions, paralysis, and death. In addition, the patient was informed of those risks and complications associated to Spine-related procedures, such as failure to decrease pain; infection (i.e.: Meningitis, epidural or intraspinal abscess); bleeding (i.e.: epidural hematoma, subarachnoid hemorrhage, or any other type of intraspinal or peri-dural bleeding); organ or nerve  damage (i.e.: Any type of peripheral nerve, nerve root, or spinal cord injury) with subsequent damage  to sensory, motor, and/or autonomic systems, resulting in permanent pain, numbness, and/or weakness of one or several areas of the body; allergic reactions; (i.e.: anaphylactic reaction); and/or death. Furthermore, the patient was informed of those risks and complications associated with the medications. These include, but are not limited to: allergic reactions (i.e.: anaphylactic or anaphylactoid reaction(s)); adrenal axis suppression; blood sugar elevation that in diabetics may result in ketoacidosis or comma; water retention that in patients with history of congestive heart failure may result in shortness of breath, pulmonary edema, and decompensation with resultant heart failure; weight gain; swelling or edema; medication-induced neural toxicity; particulate matter embolism and blood vessel occlusion with resultant organ, and/or nervous system infarction; and/or aseptic necrosis of one or more joints. Finally, the patient was informed that Medicine is not an exact science; therefore, there is also the possibility of unforeseen or unpredictable risks and/or possible complications that may result in a catastrophic outcome. The patient indicated having understood very clearly. We have given the patient no guarantees and we have made no promises. Enough time was given to the patient to ask questions, all of which were answered to the patient's satisfaction. Ms. Hitsman has indicated that she wanted to continue with the procedure. Attestation: I, the ordering provider, attest that I have discussed with the patient the benefits, risks, side-effects, alternatives, likelihood of achieving goals, and potential problems during recovery for the procedure that I have provided informed consent. Date  Time: {CHL [facility]-PAIN TIME CHOICES:[id number]}  Pre-Procedure Preparation:  Monitoring: As per clinic protocol.  Respiration, ETCO2, SpO2, BP, heart rate and rhythm monitor placed and checked for adequate function Safety Precautions: Patient was assessed for positional comfort and pressure points before starting the procedure. Time-out: I initiated and conducted the "Time-out" before starting the procedure, as per protocol. The patient was asked to participate by confirming the accuracy of the "Time Out" information. Verification of the correct person, site, and procedure were performed and confirmed by me, the nursing staff, and the patient. "Time-out" conducted as per Joint Commission's Universal Protocol (UP.01.01.01). Time:    Description of Procedure:          Laterality: Bilateral. The procedure was performed in identical fashion on both sides. Levels:  L2, L3, L4, L5, & S1 Medial Branch Level(s) Area Prepped: Posterior Lumbosacral Region DuraPrep (Iodine Povacrylex [0.7% available iodine] and Isopropyl Alcohol, 74% w/w) Safety Precautions: Aspiration looking for blood return was conducted prior to all injections. At no point did we inject any substances, as a needle was being advanced. Before injecting, the patient was told to immediately notify me if she was experiencing any new onset of "ringing in the ears, or metallic taste in the mouth". No attempts were made at seeking any paresthesias. Safe injection practices and needle disposal techniques used. Medications properly checked for expiration dates. SDV (single dose vial) medications used. After the completion of the procedure, all disposable equipment used was discarded in the proper designated medical waste containers. Local Anesthesia: Protocol guidelines were followed. The patient was positioned over the fluoroscopy table. The area was prepped in the usual manner. The time-out was completed. The target area was identified using fluoroscopy. A 12-in long, straight, sterile hemostat was used with fluoroscopic guidance to locate the targets for each  level blocked. Once located, the skin was marked with an approved surgical skin marker. Once all sites were marked, the skin (epidermis, dermis, and hypodermis), as well as deeper tissues (fat, connective tissue and muscle) were infiltrated with a small amount  of a short-acting local anesthetic, loaded on a 10cc syringe with a 25G, 1.5-in  Needle. An appropriate amount of time was allowed for local anesthetics to take effect before proceeding to the next step. Local Anesthetic: Lidocaine 2.0% The unused portion of the local anesthetic was discarded in the proper designated containers. Technical explanation of process:  L2 Medial Branch Nerve Block (MBB): The target area for the L2 medial branch is at the junction of the postero-lateral aspect of the superior articular process and the superior, posterior, and medial edge of the transverse process of L3. Under fluoroscopic guidance, a Quincke needle was inserted until contact was made with os over the superior postero-lateral aspect of the pedicular shadow (target area). After negative aspiration for blood, 0.5 mL of the nerve block solution was injected without difficulty or complication. The needle was removed intact. L3 Medial Branch Nerve Block (MBB): The target area for the L3 medial branch is at the junction of the postero-lateral aspect of the superior articular process and the superior, posterior, and medial edge of the transverse process of L4. Under fluoroscopic guidance, a Quincke needle was inserted until contact was made with os over the superior postero-lateral aspect of the pedicular shadow (target area). After negative aspiration for blood, 0.5 mL of the nerve block solution was injected without difficulty or complication. The needle was removed intact. L4 Medial Branch Nerve Block (MBB): The target area for the L4 medial branch is at the junction of the postero-lateral aspect of the superior articular process and the superior, posterior, and  medial edge of the transverse process of L5. Under fluoroscopic guidance, a Quincke needle was inserted until contact was made with os over the superior postero-lateral aspect of the pedicular shadow (target area). After negative aspiration for blood, 0.5 mL of the nerve block solution was injected without difficulty or complication. The needle was removed intact. L5 Medial Branch Nerve Block (MBB): The target area for the L5 medial branch is at the junction of the postero-lateral aspect of the superior articular process and the superior, posterior, and medial edge of the sacral ala. Under fluoroscopic guidance, a Quincke needle was inserted until contact was made with os over the superior postero-lateral aspect of the pedicular shadow (target area). After negative aspiration for blood, 0.5 mL of the nerve block solution was injected without difficulty or complication. The needle was removed intact. S1 Medial Branch Nerve Block (MBB): The target area for the S1 medial branch is at the posterior and inferior 6 o'clock position of the L5-S1 facet joint. Under fluoroscopic guidance, the Quincke needle inserted for the L5 MBB was redirected until contact was made with os over the inferior and postero aspect of the sacrum, at the 6 o' clock position under the L5-S1 facet joint (Target area). After negative aspiration for blood, 0.5 mL of the nerve block solution was injected without difficulty or complication. The needle was removed intact.  Nerve block solution: 0.2% PF-Ropivacaine + Triamcinolone (40 mg/mL) diluted to a final concentration of 4 mg of Triamcinolone/mL of Ropivacaine The unused portion of the solution was discarded in the proper designated containers. Procedural Needles: 22-gauge, 3.5-inch, Quincke needles used for all levels.  Once the entire procedure was completed, the treated area was cleaned, making sure to leave some of the prepping solution back to take advantage of its long term  bactericidal properties.      Illustration of the posterior view of the lumbar spine and the posterior neural structures. Laminae of L2  through S1 are labeled. DPRL5, dorsal primary ramus of L5; DPRS1, dorsal primary ramus of S1; DPR3, dorsal primary ramus of L3; FJ, facet (zygapophyseal) joint L3-L4; I, inferior articular process of L4; LB1, lateral branch of dorsal primary ramus of L1; IAB, inferior articular branches from L3 medial branch (supplies L4-L5 facet joint); IBP, intermediate branch plexus; MB3, medial branch of dorsal primary ramus of L3; NR3, third lumbar nerve root; S, superior articular process of L5; SAB, superior articular branches from L4 (supplies L4-5 facet joint also); TP3, transverse process of L3.  There were no vitals filed for this visit.   Start Time:   hrs. End Time:   hrs.  Imaging Guidance (Spinal):          Type of Imaging Technique: Fluoroscopy Guidance (Spinal) Indication(s): Assistance in needle guidance and placement for procedures requiring needle placement in or near specific anatomical locations not easily accessible without such assistance. Exposure Time: Please see nurses notes. Contrast: None used. Fluoroscopic Guidance: I was personally present during the use of fluoroscopy. "Tunnel Vision Technique" used to obtain the best possible view of the target area. Parallax error corrected before commencing the procedure. "Direction-depth-direction" technique used to introduce the needle under continuous pulsed fluoroscopy. Once target was reached, antero-posterior, oblique, and lateral fluoroscopic projection used confirm needle placement in all planes. Images permanently stored in EMR. Interpretation: No contrast injected. I personally interpreted the imaging intraoperatively. Adequate needle placement confirmed in multiple planes. Permanent images saved into the patient's record.  Antibiotic Prophylaxis:   Anti-infectives (From admission, onward)    None       Indication(s): None identified  Post-operative Assessment:  Post-procedure Vital Signs:  Pulse/HCG Rate:    Temp:   Resp:   BP:   SpO2:    EBL: None  Complications: No immediate post-treatment complications observed by team, or reported by patient.  Note: The patient tolerated the entire procedure well. A repeat set of vitals were taken after the procedure and the patient was kept under observation following institutional policy, for this type of procedure. Post-procedural neurological assessment was performed, showing return to baseline, prior to discharge. The patient was provided with post-procedure discharge instructions, including a section on how to identify potential problems. Should any problems arise concerning this procedure, the patient was given instructions to immediately contact us, at any time, without hesitation. In any case, we plan to contact the patient by telephone for a follow-up status report regarding this interventional procedure.  Comments:  No additional relevant information.  Plan of Care  Orders:  No orders of the defined types were placed in this encounter.  Chronic Opioid Analgesic:  None MME/day: 0 mg/day    Medications ordered for procedure: No orders of the defined types were placed in this encounter.  Medications administered: Madaline Savage. Baltzell had no medications administered during this visit.  See the medical record for exact dosing, route, and time of administration.  Follow-up plan:   No follow-ups on file.     Interventional Therapies  Risk  Complexity Considerations:   Estimated body mass index is 52.46 kg/m as calculated from the following:   Height as of this encounter: 5\' 8"  (1.727 m).   Weight as of this encounter: 345 lb (156.5 kg). WNL   Planned  Pending:   Diagnostic/therapeutic right IA ER steroid (Zilretta) knee injection #1 under fluoroscopic guidance    Under consideration:   Diagnostic bilateral lumbar facet  MBB #1  Therapeutic right IA Monovisc knee injection #1  Completed:   Diagnostic/therapeutic right IA Zilretta knee injection x1 (05/22/2021) (100/100/100/100)    Therapeutic  Palliative (PRN) options:   None established     Recent Visits Date Type Provider Dept  06/07/21 Office Visit Milinda Pointer, MD [facility]  05/22/21 Procedure visit Milinda Pointer, MD [facility]  05/02/21 Office Visit Milinda Pointer, MD [facility]  Showing recent visits within past 90 days and meeting all other requirements Future Appointments Date Type Provider Dept  07/03/21 Appointment Milinda Pointer, MD [facility]  Showing future appointments within next 90 days and meeting all other requirements  Disposition: Discharge home  Discharge (Date  Time): 07/03/2021;   hrs.   Primary Care Physician: Marguerita Merles, MD Location: [facility] Note by: Gaspar Cola, MD Date: 07/03/2021; Time: 6:57 AM  Disclaimer:  Medicine is not an Chief Strategy Officer. The only guarantee in medicine is that nothing is guaranteed. It is important to note that the decision to proceed with this intervention was based on the information collected from the patient. The Data and conclusions were drawn from the patient's questionnaire, the interview, and the physical examination. Because the information was provided in large part by the patient, it cannot be guaranteed that it has not been purposely or unconsciously manipulated. Every effort has been made to obtain as much relevant data as possible for this evaluation. It is important to note that the conclusions that lead to this procedure are derived in large part from the available data. Always take into account that the treatment will also be dependent on availability of resources and existing treatment guidelines, considered by other Pain Management Practitioners as being common knowledge and  practice, at the time of the intervention. For Medico-Legal purposes, it is also important to point out that variation in procedural techniques and pharmacological choices are the acceptable norm. The indications, contraindications, technique, and results of the above procedure should only be interpreted and judged by a Board-Certified Interventional Pain Specialist with extensive familiarity and expertise in the same exact procedure and technique.

## 2021-07-03 ENCOUNTER — Ambulatory Visit: Payer: BC Managed Care – PPO | Admitting: Pain Medicine

## 2021-07-10 ENCOUNTER — Telehealth: Payer: Self-pay

## 2021-07-10 ENCOUNTER — Other Ambulatory Visit: Payer: Self-pay

## 2021-07-10 MED ORDER — NA SULFATE-K SULFATE-MG SULF 17.5-3.13-1.6 GM/177ML PO SOLN: 1.0000 | Freq: Once | ORAL | 0 refills | Status: AC

## 2021-07-10 NOTE — Telephone Encounter (Signed)
Gastroenterology Pre-Procedure Review  Request Date: 07/16/21 Requesting Physician: Dr. Marius Ditch   PATIENT REVIEW QUESTIONS: The patient responded to the following health history questions as indicated:    1. Are you having any GI issues? no 2. Do you have a personal history of Polyps? no 3. Do you have a family history of Colon Cancer or Polyps? yes (brother had polyps) 4. Diabetes Mellitus? no 5. Joint replacements in the past 12 months?no 6. Major health problems in the past 3 months?no 7. Any artificial heart valves, MVP, or defibrillator?no    MEDICATIONS & ALLERGIES:    Patient reports the following regarding taking any anticoagulation/antiplatelet therapy:   Plavix, Coumadin, Eliquis, Xarelto, Lovenox, Pradaxa, Brilinta, or Effient? no Aspirin? no  Patient confirms/reports the following medications:  Current Outpatient Medications  Medication Sig Dispense Refill   Cholecalciferol (VITAMIN D3) 125 MCG (5000 UT) CAPS Take 1 capsule (5,000 Units total) by mouth daily with breakfast. Take along with calcium and magnesium. 30 capsule 2   etodolac (LODINE) 400 MG tablet Take 400 mg by mouth 2 (two) times daily.     furosemide (LASIX) 20 MG tablet 1 BY MOUTH EVERY MORNING FOR FLUID RETENTION AND HIGH BLOOD PRESSURE     lisinopril-hydrochlorothiazide (PRINZIDE,ZESTORETIC) 20-12.5 MG per tablet Take 1 tablet by mouth daily.     No current facility-administered medications for this visit.    Patient confirms/reports the following allergies:  No Known Allergies  No orders of the defined types were placed in this encounter.   AUTHORIZATION INFORMATION Primary Insurance: 1D#: Group #:  Secondary Insurance: 1D#: Group #:  SCHEDULE INFORMATION: Date:  Time: Location:

## 2021-07-13 ENCOUNTER — Telehealth: Payer: Self-pay

## 2021-07-13 ENCOUNTER — Telehealth: Payer: Self-pay | Admitting: Gastroenterology

## 2021-07-13 NOTE — Telephone Encounter (Signed)
Called patient back to reschedule her appointment for procedure she wants 08/13/2021 now so sent new referral to [facility] and called endo and sent new communications

## 2021-07-13 NOTE — Telephone Encounter (Signed)
CALLED PATIENT RESCHEDULE TO 08/13/2021 SENT NEW REFERRAL TO [facility] AND SENT NEW COMMUNICATIONS

## 2021-07-24 ENCOUNTER — Encounter: Payer: Self-pay | Admitting: Pain Medicine

## 2021-07-24 ENCOUNTER — Telehealth: Payer: Self-pay | Admitting: *Deleted

## 2021-07-24 ENCOUNTER — Ambulatory Visit
Admission: RE | Admit: 2021-07-24 | Discharge: 2021-07-24 | Disposition: A | Discharge status: Home / Self Care | Payer: BC Managed Care – PPO | Source: Ambulatory Visit | Attending: Pain Medicine | Admitting: Pain Medicine

## 2021-07-24 ENCOUNTER — Ambulatory Visit (HOSPITAL_BASED_OUTPATIENT_CLINIC_OR_DEPARTMENT_OTHER): Payer: BC Managed Care – PPO | Admitting: Pain Medicine

## 2021-07-24 ENCOUNTER — Other Ambulatory Visit: Payer: Self-pay

## 2021-07-24 VITALS — BP 175/77 | HR 72 | Temp 96.6°F | Resp 16 | Ht 68.0 in | Wt 333.0 lb

## 2021-07-24 DIAGNOSIS — M5137 Other intervertebral disc degeneration, lumbosacral region: Secondary | ICD-10-CM

## 2021-07-24 DIAGNOSIS — Z6841 Body Mass Index (BMI) 40.0 and over, adult: Secondary | ICD-10-CM | POA: Diagnosis present

## 2021-07-24 DIAGNOSIS — M545 Low back pain, unspecified: Secondary | ICD-10-CM

## 2021-07-24 DIAGNOSIS — M153 Secondary multiple arthritis: Secondary | ICD-10-CM | POA: Insufficient documentation

## 2021-07-24 DIAGNOSIS — G8929 Other chronic pain: Secondary | ICD-10-CM | POA: Diagnosis present

## 2021-07-24 DIAGNOSIS — M47817 Spondylosis without myelopathy or radiculopathy, lumbosacral region: Secondary | ICD-10-CM | POA: Insufficient documentation

## 2021-07-24 DIAGNOSIS — M47816 Spondylosis without myelopathy or radiculopathy, lumbar region: Secondary | ICD-10-CM | POA: Insufficient documentation

## 2021-07-24 MED ORDER — PENTAFLUOROPROP-TETRAFLUOROETH EX AERO
INHALATION_SPRAY | Freq: Once | CUTANEOUS | Status: AC
  Administered 2021-07-24: 30 via TOPICAL
  Filled 2021-07-24: qty 116

## 2021-07-24 MED ORDER — ROPIVACAINE HCL 2 MG/ML IJ SOLN
18.0000 mL | Freq: Once | INTRAMUSCULAR | Status: AC
  Administered 2021-07-24: 18 mL via PERINEURAL

## 2021-07-24 MED ORDER — TRIAMCINOLONE ACETONIDE 40 MG/ML IJ SUSP
80.0000 mg | Freq: Once | INTRAMUSCULAR | Status: AC
  Administered 2021-07-24: 80 mg
  Filled 2021-07-24: qty 2

## 2021-07-24 MED ORDER — LIDOCAINE HCL 2 % IJ SOLN
20.0000 mL | Freq: Once | INTRAMUSCULAR | Status: AC
  Administered 2021-07-24: 200 mg

## 2021-07-24 NOTE — Progress Notes (Signed)
PROVIDER NOTE: Information contained herein reflects review and annotations entered in association with encounter. Interpretation of such information and data should be left to medically-trained personnel. Information provided to patient can be located elsewhere in the medical record under "Patient Instructions". Document created using STT-dictation technology, any transcriptional errors that may result from process are unintentional.    Patient: Sharon Holden  Service Category: Procedure Provider: Gaspar Cola, MD DOB: Jun 30, 1961 DOS: 07/24/2021 Location: [facility] MRN: [id number] Setting: Ambulatory - outpatient Referring Provider: Milinda Pointer, MD Type: Established Patient Specialty: Interventional Pain Management PCP: Marguerita Merles, MD  Primary Reason for Visit: Interventional Pain Management Treatment. CC: Back Pain (low)   Procedure:          Type: Lumbar Facet, Medial Branch Block(s) #1  Primary Purpose: Diagnostic/Therapeutic Region: Posterolateral Lumbosacral Spine Level: L2, L3, L4, L5, & S1 Medial Branch Level(s). Injecting these levels blocks the L3-4, L4-5, and L5-S1 lumbar facet joints. Laterality: Bilateral Anesthesia: Local (1-2% Lidocaine)  Anxiolysis: None  Sedation: None  Guidance: Fluoroscopy          Indications: 1. Lumbar facet syndrome (Bilateral)   2. Spondylosis without myelopathy or radiculopathy, lumbosacral region   3. Chronic low back pain (1ry area of Pain) (Bilateral) (R>L) w/o sciatica   4. DDD (degenerative disc disease), lumbosacral   5. Secondary osteoarthritis of multiple sites   6. Morbid obesity with BMI of 50.0-59.9, adult (HCC)    Pain Score: Pre-procedure: 8 /10 Post-procedure: 8 /10    Position: Prone  Pre-op H&P Assessment:  Ms. Berland is a 60 y.o. (year old), female patient, seen today for interventional treatment. She  has a past surgical history that includes Gastric bypass; Cesarean section;  Ureteroscopy with holmium laser lithotripsy (Left, 06/20/2016); and Cystoscopy with stent placement (Left, 06/20/2016). Ms. Kimm has a current medication list which includes the following prescription(s): vitamin d3, etodolac, furosemide, and lisinopril-hydrochlorothiazide. Her primarily concern today is the Back Pain (low)  Initial Vital Signs:  Pulse/HCG Rate: 72ECG Heart Rate: 96 Temp: (!) 96.6 F (35.9 C) Resp: 18 BP: (!) 175/70 SpO2: 100 %  BMI: Estimated body mass index is 50.63 kg/m as calculated from the following:   Height as of this encounter: 5\' 8"  (1.727 m).   Weight as of this encounter: 333 lb (151 kg).  Risk Assessment: Allergies: Reviewed. She has No Known Allergies.  Allergy Precautions: None required Coagulopathies: Reviewed. None identified.  Blood-thinner therapy: None at this time Active Infection(s): Reviewed. None identified. Ms. Yera is afebrile  Site Confirmation: Ms. Gang was asked to confirm the procedure and laterality before marking the site Procedure checklist: Completed Consent: Before the procedure and under the influence of no sedative(s), amnesic(s), or anxiolytics, the patient was informed of the treatment options, risks and possible complications. To fulfill our ethical and legal obligations, as recommended by the American Medical Association's Code of Ethics, I have informed the patient of my clinical impression; the nature and purpose of the treatment or procedure; the risks, benefits, and possible complications of the intervention; the alternatives, including doing nothing; the risk(s) and benefit(s) of the alternative treatment(s) or procedure(s); and the risk(s) and benefit(s) of doing nothing. The patient was provided information about the general risks and possible complications associated with the procedure. These may include, but are not limited to: failure to achieve desired goals, infection, bleeding, organ or nerve damage, allergic  reactions, paralysis, and death. In addition, the patient was informed of those risks and complications  associated to Spine-related procedures, such as failure to decrease pain; infection (i.e.: Meningitis, epidural or intraspinal abscess); bleeding (i.e.: epidural hematoma, subarachnoid hemorrhage, or any other type of intraspinal or peri-dural bleeding); organ or nerve damage (i.e.: Any type of peripheral nerve, nerve root, or spinal cord injury) with subsequent damage to sensory, motor, and/or autonomic systems, resulting in permanent pain, numbness, and/or weakness of one or several areas of the body; allergic reactions; (i.e.: anaphylactic reaction); and/or death. Furthermore, the patient was informed of those risks and complications associated with the medications. These include, but are not limited to: allergic reactions (i.e.: anaphylactic or anaphylactoid reaction(s)); adrenal axis suppression; blood sugar elevation that in diabetics may result in ketoacidosis or comma; water retention that in patients with history of congestive heart failure may result in shortness of breath, pulmonary edema, and decompensation with resultant heart failure; weight gain; swelling or edema; medication-induced neural toxicity; particulate matter embolism and blood vessel occlusion with resultant organ, and/or nervous system infarction; and/or aseptic necrosis of one or more joints. Finally, the patient was informed that Medicine is not an exact science; therefore, there is also the possibility of unforeseen or unpredictable risks and/or possible complications that may result in a catastrophic outcome. The patient indicated having understood very clearly. We have given the patient no guarantees and we have made no promises. Enough time was given to the patient to ask questions, all of which were answered to the patient's satisfaction. Ms. Giron has indicated that she wanted to continue with the procedure. Attestation: I,  the ordering provider, attest that I have discussed with the patient the benefits, risks, side-effects, alternatives, likelihood of achieving goals, and potential problems during recovery for the procedure that I have provided informed consent. Date  Time: 07/24/2021 11:39 AM  Pre-Procedure Preparation:  Monitoring: As per clinic protocol. Respiration, ETCO2, SpO2, BP, heart rate and rhythm monitor placed and checked for adequate function Safety Precautions: Patient was assessed for positional comfort and pressure points before starting the procedure. Time-out: I initiated and conducted the "Time-out" before starting the procedure, as per protocol. The patient was asked to participate by confirming the accuracy of the "Time Out" information. Verification of the correct person, site, and procedure were performed and confirmed by me, the nursing staff, and the patient. "Time-out" conducted as per Joint Commission's Universal Protocol (UP.01.01.01). Time: 1253  Description of Procedure:          Laterality: Bilateral. The procedure was performed in identical fashion on both sides. Levels:  L2, L3, L4, L5, & S1 Medial Branch Level(s) Area Prepped: Posterior Lumbosacral Region DuraPrep (Iodine Povacrylex [0.7% available iodine] and Isopropyl Alcohol, 74% w/w) Safety Precautions: Aspiration looking for blood return was conducted prior to all injections. At no point did we inject any substances, as a needle was being advanced. Before injecting, the patient was told to immediately notify me if she was experiencing any new onset of "ringing in the ears, or metallic taste in the mouth". No attempts were made at seeking any paresthesias. Safe injection practices and needle disposal techniques used. Medications properly checked for expiration dates. SDV (single dose vial) medications used. After the completion of the procedure, all disposable equipment used was discarded in the proper designated medical waste  containers. Local Anesthesia: Protocol guidelines were followed. The patient was positioned over the fluoroscopy table. The area was prepped in the usual manner. The time-out was completed. The target area was identified using fluoroscopy. A 12-in long, straight, sterile hemostat was used with  fluoroscopic guidance to locate the targets for each level blocked. Once located, the skin was marked with an approved surgical skin marker. Once all sites were marked, the skin (epidermis, dermis, and hypodermis), as well as deeper tissues (fat, connective tissue and muscle) were infiltrated with a small amount of a short-acting local anesthetic, loaded on a 10cc syringe with a 25G, 1.5-in  Needle. An appropriate amount of time was allowed for local anesthetics to take effect before proceeding to the next step. Local Anesthetic: Lidocaine 2.0% The unused portion of the local anesthetic was discarded in the proper designated containers. Technical explanation of process:  L2 Medial Branch Nerve Block (MBB): The target area for the L2 medial branch is at the junction of the postero-lateral aspect of the superior articular process and the superior, posterior, and medial edge of the transverse process of L3. Under fluoroscopic guidance, a Quincke needle was inserted until contact was made with os over the superior postero-lateral aspect of the pedicular shadow (target area). After negative aspiration for blood, 0.5 mL of the nerve block solution was injected without difficulty or complication. The needle was removed intact. L3 Medial Branch Nerve Block (MBB): The target area for the L3 medial branch is at the junction of the postero-lateral aspect of the superior articular process and the superior, posterior, and medial edge of the transverse process of L4. Under fluoroscopic guidance, a Quincke needle was inserted until contact was made with os over the superior postero-lateral aspect of the pedicular shadow (target area).  After negative aspiration for blood, 0.5 mL of the nerve block solution was injected without difficulty or complication. The needle was removed intact. L4 Medial Branch Nerve Block (MBB): The target area for the L4 medial branch is at the junction of the postero-lateral aspect of the superior articular process and the superior, posterior, and medial edge of the transverse process of L5. Under fluoroscopic guidance, a Quincke needle was inserted until contact was made with os over the superior postero-lateral aspect of the pedicular shadow (target area). After negative aspiration for blood, 0.5 mL of the nerve block solution was injected without difficulty or complication. The needle was removed intact. L5 Medial Branch Nerve Block (MBB): The target area for the L5 medial branch is at the junction of the postero-lateral aspect of the superior articular process and the superior, posterior, and medial edge of the sacral ala. Under fluoroscopic guidance, a Quincke needle was inserted until contact was made with os over the superior postero-lateral aspect of the pedicular shadow (target area). After negative aspiration for blood, 0.5 mL of the nerve block solution was injected without difficulty or complication. The needle was removed intact. S1 Medial Branch Nerve Block (MBB): The target area for the S1 medial branch is at the posterior and inferior 6 o'clock position of the L5-S1 facet joint. Under fluoroscopic guidance, the Quincke needle inserted for the L5 MBB was redirected until contact was made with os over the inferior and postero aspect of the sacrum, at the 6 o' clock position under the L5-S1 facet joint (Target area). After negative aspiration for blood, 0.5 mL of the nerve block solution was injected without difficulty or complication. The needle was removed intact.  Nerve block solution: 0.2% PF-Ropivacaine + Triamcinolone (40 mg/mL) diluted to a final concentration of 4 mg of Triamcinolone/mL of  Ropivacaine The unused portion of the solution was discarded in the proper designated containers. Procedural Needles: 22-gauge, 3.5-inch, Quincke needles used for all levels.  Once the entire  procedure was completed, the treated area was cleaned, making sure to leave some of the prepping solution back to take advantage of its long term bactericidal properties.      Illustration of the posterior view of the lumbar spine and the posterior neural structures. Laminae of L2 through S1 are labeled. DPRL5, dorsal primary ramus of L5; DPRS1, dorsal primary ramus of S1; DPR3, dorsal primary ramus of L3; FJ, facet (zygapophyseal) joint L3-L4; I, inferior articular process of L4; LB1, lateral branch of dorsal primary ramus of L1; IAB, inferior articular branches from L3 medial branch (supplies L4-L5 facet joint); IBP, intermediate branch plexus; MB3, medial branch of dorsal primary ramus of L3; NR3, third lumbar nerve root; S, superior articular process of L5; SAB, superior articular branches from L4 (supplies L4-5 facet joint also); TP3, transverse process of L3.  Vitals:   07/24/21 1253 07/24/21 1258 07/24/21 1304 07/24/21 1307  BP: (!) 163/85 (!) 172/85 (!) 177/82 (!) 175/77  Pulse:      Resp: 18 16 18 16   Temp:      SpO2: 100% 100% 99% 99%  Weight:      Height:         Start Time: 1253 hrs. End Time: 1307 hrs.  Imaging Guidance (Spinal):          Type of Imaging Technique: Fluoroscopy Guidance (Spinal) Indication(s): Assistance in needle guidance and placement for procedures requiring needle placement in or near specific anatomical locations not easily accessible without such assistance. Exposure Time: Please see nurses notes. Contrast: None used. Fluoroscopic Guidance: I was personally present during the use of fluoroscopy. "Tunnel Vision Technique" used to obtain the best possible view of the target area. Parallax error corrected before commencing the procedure. "Direction-depth-direction"  technique used to introduce the needle under continuous pulsed fluoroscopy. Once target was reached, antero-posterior, oblique, and lateral fluoroscopic projection used confirm needle placement in all planes. Images permanently stored in EMR. Interpretation: No contrast injected. I personally interpreted the imaging intraoperatively. Adequate needle placement confirmed in multiple planes. Permanent images saved into the patient's record.  Antibiotic Prophylaxis:   Anti-infectives (From admission, onward)    None      Indication(s): None identified  Post-operative Assessment:  Post-procedure Vital Signs:  Pulse/HCG Rate: 7290 Temp: (!) 96.6 F (35.9 C) Resp: 16 BP: (!) 175/77 SpO2: 99 %  EBL: None  Complications: No immediate post-treatment complications observed by team, or reported by patient.  Note: The patient tolerated the entire procedure well. A repeat set of vitals were taken after the procedure and the patient was kept under observation following institutional policy, for this type of procedure. Post-procedural neurological assessment was performed, showing return to baseline, prior to discharge. The patient was provided with post-procedure discharge instructions, including a section on how to identify potential problems. Should any problems arise concerning this procedure, the patient was given instructions to immediately contact us, at any time, without hesitation. In any case, we plan to contact the patient by telephone for a follow-up status report regarding this interventional procedure.  Comments:  No additional relevant information.  Plan of Care  Orders:  Orders Placed This Encounter  Procedures   LUMBAR FACET(MEDIAL BRANCH NERVE BLOCK) MBNB    Scheduling Instructions:     Procedure: Lumbar facet block (AKA.: Lumbosacral medial branch nerve block)     Side: Bilateral     Level: L3-4, L4-5, & L5-S1 Facets (L2, L3, L4, L5, & S1 Medial Branch Nerves)     Sedation:  Patient's choice.     Timeframe: Today    Order Specific Question:   Where will this procedure be performed?    Answer:   [facility]   DG PA[facility] C-ARM 1-60 MIN NO REPORT    Intraoperative interpretation by procedural physician at [place].    Standing Status:   Standing    Number of Occurrences:   1    Order Specific Question:   Reason for exam:    Answer:   Assistance in needle guidance and placement for procedures requiring needle placement in or near specific anatomical locations not easily accessible without such assistance.   Informed Consent Details: Physician/Practitioner Attestation; Transcribe to consent form and obtain patient signature    Nursing Order: Transcribe to consent form and obtain patient signature. Note: Always confirm laterality of pain with Ms. Gowin, before procedure.    Order Specific Question:   Physician/Practitioner attestation of informed consent for procedure/surgical case    Answer:   I, the physician/practitioner, attest that I have discussed with the patient the benefits, risks, side effects, alternatives, likelihood of achieving goals and potential problems during recovery for the procedure that I have provided informed consent.    Order Specific Question:   Procedure    Answer:   Lumbar Facet Block  under fluoroscopic guidance    Order Specific Question:   Physician/Practitioner performing the procedure    Answer:   Devyn Griffing A. Dossie Arbour MD    Order Specific Question:   Indication/Reason    Answer:   Low Back Pain, with our without leg pain, due to Facet Joint Arthralgia (Joint Pain) Spondylosis (Arthritis of the Spine), without myelopathy or radiculopathy (Nerve Damage).   Provide equipment / supplies at bedside    "Block Tray" (Disposable  single use) Needle type: SpinalSpinal Amount/quantity: 4 Size: Medium (5-inch) Gauge: 22G    Standing Status:   Standing    Number of Occurrences:   1    Order Specific Question:    Specify    Answer:   Block Tray    Chronic Opioid Analgesic:  None MME/day: 0 mg/day    Medications ordered for procedure: Meds ordered this encounter  Medications   lidocaine (XYLOCAINE) 2 % (with pres) injection 400 mg   pentafluoroprop-tetrafluoroeth (GEBAUERS) aerosol   ropivacaine (PF) 2 mg/mL (0.2%) (NAROPIN) injection 18 mL   triamcinolone acetonide (KENALOG-40) injection 80 mg    Medications administered: We administered lidocaine, pentafluoroprop-tetrafluoroeth, ropivacaine (PF) 2 mg/mL (0.2%), and triamcinolone acetonide.  See the medical record for exact dosing, route, and time of administration.  Follow-up plan:   Return in about 2 weeks (around 08/07/2021) for Proc-day (T,Th), (VV), (PPE).     Interventional Therapies  Risk  Complexity Considerations:   Estimated body mass index is 52.46 kg/m as calculated from the following:   Height as of this encounter: 5\' 8"  (1.727 m).   Weight as of this encounter: 345 lb (156.5 kg). WNL   Planned  Pending:   Diagnostic/therapeutic right IA ER steroid (Zilretta) knee injection #1 under fluoroscopic guidance    Under consideration:   Diagnostic bilateral lumbar facet MBB #1  Therapeutic right IA Monovisc knee injection #1    Completed:   Diagnostic/therapeutic right IA Zilretta knee injection x1 (05/22/2021) (100/100/100/100)    Therapeutic  Palliative (PRN) options:   None established    Recent Visits Date Type Provider Dept  06/07/21 Office Visit Milinda Pointer, MD [facility]  05/22/21 Procedure visit Milinda Pointer, MD  [facility]  05/02/21 Office Visit Milinda Pointer, MD [facility]  Showing recent visits within past 90 days and meeting all other requirements Today's Visits Date Type Provider Dept  07/24/21 Procedure visit Milinda Pointer, MD [facility]  Showing today's visits and meeting all other requirements Future Appointments No visits were found  meeting these conditions. Showing future appointments within next 90 days and meeting all other requirements Disposition: Discharge home  Discharge (Date  Time): 07/24/2021; 1315 hrs.   Primary Care Physician: Marguerita Merles, MD Location: [facility] Note by: Gaspar Cola, MD Date: 07/24/2021; Time: 1:11 PM  Disclaimer:  Medicine is not an Chief Strategy Officer. The only guarantee in medicine is that nothing is guaranteed. It is important to note that the decision to proceed with this intervention was based on the information collected from the patient. The Data and conclusions were drawn from the patient's questionnaire, the interview, and the physical examination. Because the information was provided in large part by the patient, it cannot be guaranteed that it has not been purposely or unconsciously manipulated. Every effort has been made to obtain as much relevant data as possible for this evaluation. It is important to note that the conclusions that lead to this procedure are derived in large part from the available data. Always take into account that the treatment will also be dependent on availability of resources and existing treatment guidelines, considered by other Pain Management Practitioners as being common knowledge and practice, at the time of the intervention. For Medico-Legal purposes, it is also important to point out that variation in procedural techniques and pharmacological choices are the acceptable norm. The indications, contraindications, technique, and results of the above procedure should only be interpreted and judged by a Board-Certified Interventional Pain Specialist with extensive familiarity and expertise in the same exact procedure and technique.

## 2021-07-24 NOTE — Progress Notes (Signed)
Safety precautions to be maintained throughout the outpatient stay will include: orient to surroundings, keep bed in low position, maintain call bell within reach at all times, provide assistance with transfer out of bed and ambulation.  

## 2021-07-24 NOTE — Patient Instructions (Addendum)
____________________________________________________________________________________________  Post-Procedure Discharge Instructions  Instructions: Apply ice:  Purpose: This will minimize any swelling and discomfort after procedure.  When: Day of procedure, as soon as you get home. How: Fill a plastic sandwich bag with crushed ice. Cover it with a small towel and apply to injection site. How long: (15 min on, 15 min off) Apply for 15 minutes then remove x 15 minutes.  Repeat sequence on day of procedure, until you go to bed. Apply heat:  Purpose: To treat any soreness and discomfort from the procedure. When: Starting the next day after the procedure. How: Apply heat to procedure site starting the day following the procedure. How long: May continue to repeat daily, until discomfort goes away. Food intake: Start with clear liquids (like water) and advance to regular food, as tolerated.  Physical activities: Keep activities to a minimum for the first 8 hours after the procedure. After that, then as tolerated. Driving: If you have received any sedation, be responsible and do not drive. You are not allowed to drive for 24 hours after having sedation. Blood thinner: (Applies only to those taking blood thinners) You may restart your blood thinner 6 hours after your procedure. Insulin: (Applies only to Diabetic patients taking insulin) As soon as you can eat, you may resume your normal dosing schedule. Infection prevention: Keep procedure site clean and dry. Shower daily and clean area with soap and water. Post-procedure Pain Diary: Extremely important that this be done correctly and accurately. Recorded information will be used to determine the next step in treatment. For the purpose of accuracy, follow these rules: Evaluate only the area treated. Do not report or include pain from an untreated area. For the purpose of this evaluation, ignore all other areas of pain, except for the treated area. After  your procedure, avoid taking a long nap and attempting to complete the pain diary after you wake up. Instead, set your alarm clock to go off every hour, on the hour, for the initial 8 hours after the procedure. Document the duration of the numbing medicine, and the relief you are getting from it. Do not go to sleep and attempt to complete it later. It will not be accurate. If you received sedation, it is likely that you were given a medication that may cause amnesia. Because of this, completing the diary at a later time may cause the information to be inaccurate. This information is needed to plan your care. Follow-up appointment: Keep your post-procedure follow-up evaluation appointment after the procedure (usually 2 weeks for most procedures, 6 weeks for radiofrequencies). DO NOT FORGET to bring you pain diary with you.   Expect: (What should I expect to see with my procedure?) From numbing medicine (AKA: Local Anesthetics): Numbness or decrease in pain. You may also experience some weakness, which if present, could last for the duration of the local anesthetic. Onset: Full effect within 15 minutes of injected. Duration: It will depend on the type of local anesthetic used. On the average, 1 to 8 hours.  From steroids (Applies only if steroids were used): Decrease in swelling or inflammation. Once inflammation is improved, relief of the pain will follow. Onset of benefits: Depends on the amount of swelling present. The more swelling, the longer it will take for the benefits to be seen. In some cases, up to 10 days. Duration: Steroids will stay in the system x 2 weeks. Duration of benefits will depend on multiple posibilities including persistent irritating factors. Side-effects: If present, they  may typically last 2 weeks (the duration of the steroids). Frequent: Cramps (if they occur, drink Gatorade and take over-the-counter Magnesium 450-500 mg once to twice a day); water retention with temporary  weight gain; increases in blood sugar; decreased immune system response; increased appetite. Occasional: Facial flushing (red, warm cheeks); mood swings; menstrual changes. Uncommon: Long-term decrease or suppression of natural hormones; bone thinning. (These are more common with higher doses or more frequent use. This is why we prefer that our patients avoid having any injection therapies in other practices.)  Very Rare: Severe mood changes; psychosis; aseptic necrosis. From procedure: Some discomfort is to be expected once the numbing medicine wears off. This should be minimal if ice and heat are applied as instructed.  Call if: (When should I call?) You experience numbness and weakness that gets worse with time, as opposed to wearing off. New onset bowel or bladder incontinence. (Applies only to procedures done in the spine)  Emergency Numbers: Durning business hours (Monday - Thursday, 8:00 AM - 4:00 PM) (Friday, 9:00 AM - 12:00 Noon): [phone] After hours: [phone] NOTE: If you are having a problem and are unable connect with, or to talk to a provider, then go to your nearest urgent care or emergency department. If the problem is serious and urgent, please call 911. ____________________________________________________________________________________________   ______________________________________________________________________________________________  Body mass index (BMI)  Body mass index (BMI) is a common tool for deciding whether a person has an appropriate body weight.  It measures a persons weight in relation to their height.   According to the [facility] of health (NIH): A BMI of less than 18.5 means that a person is underweight. A BMI of between 18.5 and 24.9 is ideal. A BMI of between 25 and 29.9 is overweight. A BMI over 30 indicates obesity.  Weight Management Required  URGENT: Your weight has been found to be adversely affecting your health.  Dear  Sharon Holden:  Your current Estimated body mass index is 51.39 kg/m as calculated from the following:   Height as of 05/22/21: _0  (1.727 m).   Weight as of 05/22/21: 338 lb (153.3 kg).  Please use the table below to identify your weight category and associated incidence of chronic pain, secondary to your weight.  Body Mass Index (BMI) Classification BMI level (kg/m2) Category Associated incidence of chronic pain  <18  Underweight   18.5-24.9 Ideal body weight   25-29.9 Overweight  20%  30-34.9 Obese (Class I)  68%  35-39.9 Severe obesity (Class II)  136%  >40 Extreme obesity (Class III)  254%   In addition: You will be considered "Morbidly Obese", if your BMI is above 30 and you have one or more of the following conditions which are known to be caused and/or directly associated with obesity: 1.    Type 2 Diabetes (Which in turn can lead to cardiovascular diseases (CVD), stroke, peripheral vascular diseases (PVD), retinopathy, nephropathy, and neuropathy) 2.    Cardiovascular Disease (High Blood Pressure; Congestive Heart Failure; High Cholesterol; Coronary Artery Disease; Angina; or History of Heart Attacks) 3.    Breathing problems (Asthma; obesity-hypoventilation syndrome; obstructive sleep apnea; chronic inflammatory airway disease; reactive airway disease; or shortness of breath) 4.    Chronic kidney disease 5.    Liver disease (nonalcoholic fatty liver disease) 6.    High blood pressure 7.    Acid reflux (gastroesophageal reflux disease; heartburn) 8.    Osteoarthritis (OA) (with any of the following: hip pain; knee  pain; and/or low back pain) 9.    Low back pain (Lumbar Facet Syndrome; and/or Degenerative Disc Disease) 10.  Hip pain (Osteoarthritis of hip) (For every 1 lbs of added body weight, there is a 2 lbs increase in pressure inside of each hip articulation. 1:2 mechanical relationship) 11.  Knee pain (Osteoarthritis of knee) (For every 1 lbs of added body weight, there is a 4  lbs increase in pressure inside of each knee articulation. 1:4 mechanical relationship) (patients with a BMI>30 kg/m2 were 6.8 times more likely to develop knee OA than normal-weight individuals) 12.  Cancer: Epidemiological studies have shown that obesity is a risk factor for: post-menopausal breast cancer; cancers of the endometrium, colon and kidney cancer; malignant adenomas of the oesophagus. Obese subjects have an approximately 1.5-3.5-fold increased risk of developing these cancers compared with normal-weight subjects, and it has been estimated that between 15 and 45% of these cancers can be attributed to overweight. More recent studies suggest that obesity may also increase the risk of other types of cancer, including pancreatic, hepatic and gallbladder cancer. (Ref: Obesity and cancer. Pischon T, Nthlings U, [employer] H. Proc Nutr Soc. 2008 May;67(2):128-45. doi: 73.6681/P9470761518343735.) The International Agency for Research on Cancer (IARC) has identified 13 cancers associated with overweight and obesity: meningioma, multiple myeloma, adenocarcinoma of the esophagus, and cancers of the thyroid, postmenopausal breast cancer, gallbladder, stomach, liver, pancreas, kidney, ovaries, uterus, colon and rectal (colorectal) cancers. 35 percent of all cancers diagnosed in women and 24 percent of those diagnosed in men are associated with overweight and obesity.  Recommendation: At this point it is urgent that you take a step back and concentrate in loosing weight. Dedicate 100% of your efforts on this task. Nothing else will improve your health more than bringing your weight down and your BMI to less than 30. If you are here, you probably have chronic pain. We know that most chronic pain patients have difficulty exercising secondary to their pain. For this reason, you must rely on proper nutrition and diet in order to lose the weight. If your BMI is above 40, you should seriously consider bariatric surgery. A  realistic goal is to lose 10% of your body weight over a period of 12 months.  Be honest to yourself, if over time you have unsuccessfully tried to lose weight, then it is time for you to seek professional help and to enter a medically supervised weight management program, and/or undergo bariatric surgery. Stop procrastinating.   Pain management considerations:  1.    Pharmacological Problems: Be advised that the use of opioid analgesics (oxycodone; hydrocodone; morphine; methadone; codeine; and all of their derivatives) have been associated with decreased metabolism and weight gain.  For this reason, should we see that you are unable to lose weight while taking these medications, it may become necessary for [place] to taper down and indefinitely discontinue them.  2.    Technical Problems: The incidence of successful interventional therapies decreases as the patient's BMI increases. It is much more difficult to accomplish a safe and effective interventional therapy on a patient with a BMI above 35. 3.    Radiation Exposure Problems: The x-rays machine, used to accomplish injection therapies, will automatically increase their x-ray output in order to capture an appropriate bone image. This means that radiation exposure increases exponentially with the patient's BMI. (The higher the BMI, the higher the radiation exposure.) Although the level of radiation used at a given time is still safe to the patient, it  is not for the physician and/or assisting staff. Unfortunately, radiation exposure is accumulative. Because physicians and the staff have to do procedures and be exposed on a daily basis, this can result in health problems such as cancer and radiation burns. Radiation exposure to the staff is monitored by the radiation batches that they wear. The exposure levels are reported back to the staff on a quarterly basis. Depending on levels of exposure, physicians and staff may be obligated by law to decrease this  exposure. This means that they have the right and obligation to refuse providing therapies where they may be overexposed to radiation. For this reason, physicians may decline to offer therapies such as radiofrequency ablation or implants to patients with a BMI above 40. 4.    Current Trends: Be advised that the current trend is to no longer offer certain therapies to patients with a BMI equal to, or above 35, due to increase perioperative risks, increased technical procedural difficulties, and excessive radiation exposure to healthcare personnel.  ______________________________________________________________________________________________   Facet Blocks Patient Information  Description: The facets are joints in the spine between the vertebrae.  Like any joints in the body, facets can become irritated and painful.  Arthritis can also effect the facets.  By injecting steroids and local anesthetic in and around these joints, we can temporarily block the nerve supply to them.  Steroids act directly on irritated nerves and tissues to reduce selling and inflammation which often leads to decreased pain.  Facet blocks may be done anywhere along the spine from the neck to the low back depending upon the location of your pain.   After numbing the skin with local anesthetic (like Novocaine), a small needle is passed onto the facet joints under x-ray guidance.  You may experience a sensation of pressure while this is being done.  The entire block usually lasts about 15-25 minutes.   Conditions which may be treated by facet blocks:  Low back/buttock pain Neck/shoulder pain Certain types of headaches  Preparation for the injection:  Do not eat any solid food or dairy products within 8 hours of your appointment. You may drink clear liquid up to 3 hours before appointment.  Clear liquids include water, black coffee, juice or soda.  No milk or cream please. You may take your regular medication, including pain  medications, with a sip of water before your appointment.  Diabetics should hold regular insulin (if taken separately) and take 1/2 normal NPH dose the morning of the procedure.  Carry some sugar containing items with you to your appointment. A driver must accompany you and be prepared to drive you home after your procedure. Bring all your current medications with you. An IV may be inserted and sedation may be given at the discretion of the physician. A blood pressure cuff, EKG and other monitors will often be applied during the procedure.  Some patients may need to have extra oxygen administered for a short period. You will be asked to provide medical information, including your allergies and medications, prior to the procedure.  We must know immediately if you are taking blood thinners (like Coumadin/Warfarin) or if you are allergic to IV iodine contrast (dye).  We must know if you could possible be pregnant.  Possible side-effects:  Bleeding from needle site Infection (rare, may require surgery) Nerve injury (rare) Numbness & tingling (temporary) Difficulty urinating (rare, temporary) Spinal headache (a headache worse with upright posture) Light-headedness (temporary) Pain at injection site (serveral days) Decreased blood pressure (  rare, temporary) Weakness in arm/leg (temporary) Pressure sensation in back/neck (temporary)   Call if you experience:  Fever/chills associated with headache or increased back/neck pain Headache worsened by an upright position New onset, weakness or numbness of an extremity below the injection site Hives or difficulty breathing (go to the emergency room) Inflammation or drainage at the injection site(s) Severe back/neck pain greater than usual New symptoms which are concerning to you  Please note:  Although the local anesthetic injected can often make your back or neck feel good for several hours after the injection, the pain will likely return. It  takes 3-7 days for steroids to work.  You may not notice any pain relief for at least one week.  If effective, we will often do a series of 2-3 injections spaced 3-6 weeks apart to maximally decrease your pain.  After the initial series, you may be a candidate for a more permanent nerve block of the facets.  If you have any questions, please call #[phone]) [facility]

## 2021-07-24 NOTE — Telephone Encounter (Signed)
Patient called and stated that she was feeling a little jittery with some blurred vision after her BLF without sedation today. Denies any other symptoms. States she has had minimal po intake since returning home so I encouraged eating and drinking something. I spoke with Dr. Dossie Arbour about it and he states that it is likely from all of the Ropivacaine and xylocaine that was used in the procedure today. States it should subside within the next 4-6 hours, but to go to ED if she starts to exhibit any other symptoms. Patient denies fever,  loss of bowel or bladder or weakness or numbness of legs.I called patient back and she verbalizes understanding of all instructions. Her son is there at home with her as well.

## 2021-07-25 ENCOUNTER — Telehealth: Payer: Self-pay

## 2021-07-25 NOTE — Telephone Encounter (Signed)
Called PP. Denies any needs at this time. Instructed to call if needed. 

## 2021-08-09 ENCOUNTER — Other Ambulatory Visit: Payer: Self-pay

## 2021-08-09 ENCOUNTER — Ambulatory Visit: Payer: BC Managed Care – PPO | Attending: Pain Medicine | Admitting: Pain Medicine

## 2021-08-09 DIAGNOSIS — M25562 Pain in left knee: Secondary | ICD-10-CM

## 2021-08-09 DIAGNOSIS — Z6841 Body Mass Index (BMI) 40.0 and over, adult: Secondary | ICD-10-CM

## 2021-08-09 DIAGNOSIS — M545 Low back pain, unspecified: Secondary | ICD-10-CM | POA: Diagnosis not present

## 2021-08-09 DIAGNOSIS — M47816 Spondylosis without myelopathy or radiculopathy, lumbar region: Secondary | ICD-10-CM | POA: Diagnosis not present

## 2021-08-09 DIAGNOSIS — M25561 Pain in right knee: Secondary | ICD-10-CM

## 2021-08-09 DIAGNOSIS — G8929 Other chronic pain: Secondary | ICD-10-CM

## 2021-08-09 NOTE — Progress Notes (Signed)
Patient: Sharon Holden  Service Category: E/M  Provider: Gaspar Cola, MD  DOB: 10/24/1960  DOS: 08/09/2021  Location: Office  MRN: [id number]  Setting: Ambulatory outpatient  Referring Provider: Marguerita Merles, MD  Type: Established Patient  Specialty: Interventional Pain Management  PCP: Marguerita Merles, MD  Location: Remote location  Delivery: TeleHealth     Virtual Encounter - Pain Management PROVIDER NOTE: Information contained herein reflects review and annotations entered in association with encounter. Interpretation of such information and data should be left to medically-trained personnel. Information provided to patient can be located elsewhere in the medical record under "Patient Instructions". Document created using STT-dictation technology, any transcriptional errors that may result from process are unintentional.    Contact & Pharmacy Preferred: [phone] Home: [phone] (home) Mobile: [phone] (mobile) E-mail: [email or web address]  [id number] [address], [place] [address] Phone: [phone] Fax: [phone]   Pre-screening  Sharon Holden offered "in-person" vs "virtual" encounter. She indicated preferring virtual for this encounter.   Reason COVID-19*  Social distancing based on CDC and AMA recommendations.   I contacted Sharon Holden on 08/09/2021 via telephone.      I clearly identified myself as Gaspar Cola, MD. I verified that I was speaking with the correct person using two identifiers (Name: Sharon Holden, and date of birth: 1960/11/25).  Consent I sought verbal advanced consent from Sharon Holden for virtual visit interactions. I informed Sharon Holden of possible security and privacy concerns, risks, and limitations associated with providing "not-in-person" medical evaluation and management services. I also informed Sharon Holden of the availability of "in-person" appointments. Finally, I  informed her that there would be a charge for the virtual visit and that she could be  personally, fully or partially, financially responsible for it. Sharon Holden expressed understanding and agreed to proceed.   Historic Elements   Sharon Holden is a 60 y.o. year old, female patient evaluated today after our last contact on 07/24/2021. Sharon Holden  has a past medical history of Arthritis, History of kidney stones, Hypertension, Kidney stones, and Sleep apnea. She also  has a past surgical history that includes Gastric bypass; Cesarean section; Ureteroscopy with holmium laser lithotripsy (Left, 06/20/2016); and Cystoscopy with stent placement (Left, 06/20/2016). Sharon Holden has a current medication list which includes the following prescription(s): etodolac, furosemide, and lisinopril-hydrochlorothiazide. She  reports that she has never smoked. She has never used smokeless tobacco. She reports current alcohol use. She reports that she does not use drugs. Sharon Holden has No Known Allergies.   HPI  Today, she is being contacted for a post-procedure assessment.  The patient indicates having attained 100% relief of the pain for the duration of the local anesthetic which confirms that this is where the pain was originating.  In addition, the patient also indicates having an ongoing 80% improvement in her low back pain indicating that there was an inflammatory mechanism sustaining that pain.  She refers that some of her knee pain is beginning to come back and she wishes that all of her pain were gone at the same time.  In any case, we have explained to the patient that we will keep an eye on this and if she needs anything else for [place] to do to just give [place] a call.  She indicated that understanding and being in agreement with that plan.  Post-Procedure Evaluation  Procedure(s):    Procedure:  Type: Lumbar Facet, Medial Branch Block(s) #1  Primary Purpose: Diagnostic/Therapeutic Region: Posterolateral  Lumbosacral Spine Level: L2, L3, L4, L5, & S1 Medial Branch Level(s). Injecting these levels blocks the L3-4, L4-5, and L5-S1 lumbar facet joints. Laterality: Bilateral Anesthesia: Local (1-2% Lidocaine)  Anxiolysis: None  Sedation: None  Guidance: Fluoroscopy          Indications: 1. Lumbar facet syndrome (Bilateral)   2. Spondylosis without myelopathy or radiculopathy, lumbosacral region   3. Chronic low back pain (1ry area of Pain) (Bilateral) (R>L) w/o sciatica   4. DDD (degenerative disc disease), lumbosacral   5. Secondary osteoarthritis of multiple sites   6. Morbid obesity with BMI of 50.0-59.9, adult (HCC)    Pain Score: Pre-procedure: 8 /10 Post-procedure: 8 /10    Anxiolysis: Please see nurses note.  Effectiveness during initial hour after procedure (Ultra-Short Term Relief): 100 %.  Local anesthetic used: Long-acting (4-6 hours) Effectiveness: Defined as any analgesic benefit obtained secondary to the administration of local anesthetics. This carries significant diagnostic value as to the etiological location, or anatomical origin, of the pain. Duration of benefit is expected to coincide with the duration of the local anesthetic used.  Effectiveness during initial 4-6 hours after procedure (Short-Term Relief): 100 %.  Long-term benefit: Defined as any relief past the pharmacologic duration of the local anesthetics.  Effectiveness past the initial 6 hours after procedure (Long-Term Relief): 80 % (ongoing).  Benefits, current: Defined as benefit present at the time of this evaluation.   Analgesia: The patient indicates having an ongoing 80% relief of her low back pain. Function: Sharon Holden reports improvement in function ROM: Sharon Holden reports improvement in ROM  Pharmacotherapy Assessment   Analgesic: None MME/day: 0 mg/day    Monitoring: [place] PMP: PDMP reviewed during this encounter.       Pharmacotherapy: No side-effects or adverse reactions reported. Compliance:  No problems identified. Effectiveness: Clinically acceptable. Plan: Refer to "POC". UDS:  Summary  Date Value Ref Range Status  03/14/2021 Note  Final    Comment:    ==================================================================== Compliance Drug Analysis, Ur ==================================================================== Test                             Result       Flag       Units    NO DRUGS DETECTED. ==================================================================== Test                      Result    Flag   Units      Ref Range   Creatinine              183              mg/dL      >=20 ==================================================================== Declared Medications:  The flagging and interpretation on this report are based on the  following declared medications.  Unexpected results may arise from  inaccuracies in the declared medications.   **Note: The testing scope of this panel does not include the  following reported medications:   Etodolac  Furosemide  Hydrochlorothiazide (Zestoretic)  Lisinopril (Zestoretic)  Pantoprazole ==================================================================== For clinical consultation, please call [phone]. ====================================================================      Laboratory Chemistry Profile   Renal Lab Results  Component Value Date   BUN 12 03/14/2021   CREATININE 0.98 03/14/2021   GFRAA >60 02/03/2017   GFRNONAA >60 03/14/2021    Hepatic Lab Results  Component  Value Date   AST 13 (L) 03/14/2021   ALT 13 03/14/2021   ALBUMIN 4.2 03/14/2021   ALKPHOS 95 03/14/2021   LIPASE 30 02/03/2017    Electrolytes Lab Results  Component Value Date   NA 139 03/14/2021   K 3.8 03/14/2021   CL 104 03/14/2021   CALCIUM 9.3 03/14/2021   MG 1.9 03/14/2021    Bone Lab Results  Component Value Date   VD25OH 8.44 (L) 03/14/2021    Inflammation (CRP: Acute Phase) (ESR: Chronic Phase) Lab  Results  Component Value Date   CRP 0.6 03/14/2021   ESRSEDRATE 7 03/14/2021         Note: Above Lab results reviewed.  Imaging  DG PAIN CLINIC C-ARM 1-60 MIN NO REPORT Fluoro was used, but no Radiologist interpretation will be provided.  Please refer to "NOTES" tab for provider progress note.  Assessment  The primary encounter diagnosis was Lumbar facet syndrome (Bilateral). Diagnoses of Chronic low back pain (1ry area of Pain) (Bilateral) (R>L) w/o sciatica, Chronic knee pain (3ry area of Pain) (Bilateral) (R>L), and Morbid obesity with BMI of 50.0-59.9, adult [facility]) were also pertinent to this visit.  Plan of Care  Problem-specific:  No problem-specific Assessment & Plan notes found for this encounter.  Sharon Holden has a current medication list which includes the following long-term medication(s): furosemide and lisinopril-hydrochlorothiazide.  Pharmacotherapy (Medications Ordered): No orders of the defined types were placed in this encounter.  Orders:  No orders of the defined types were placed in this encounter.  Follow-up plan:   No follow-ups on file.     Interventional Therapies  Risk  Complexity Considerations:   Estimated body mass index is 52.46 kg/m as calculated from the following:   Height as of this encounter: $RemoveBeforeD'5\' 8"'yWDQWmWstloBBl$  (1.727 m).   Weight as of this encounter: 345 lb (156.5 kg). WNL   Planned  Pending:      Under consideration:   Diagnostic bilateral lumbar facet MBB #2  Therapeutic right IA Zilretta knee injection #2  Therapeutic right IA Monovisc knee injection #1    Completed:   Diagnostic/therapeutic bilateral lumbar facet MBB x1 (07/24/2021) (100/100/80/80)  Diagnostic/therapeutic right IA Zilretta knee injection x1 (05/22/2021) (100/100/100/100)    Therapeutic  Palliative (PRN) options:   Palliative/therapeutic right IA Zilretta knee injections  Therapeutic/palliative bilateral lumbar facet MBB      Recent Visits Date Type Provider  Dept  07/24/21 Procedure visit Milinda Pointer, MD [facility]  06/07/21 Office Visit Milinda Pointer, MD [facility]  05/22/21 Procedure visit Milinda Pointer, MD [facility]  Showing recent visits within past 90 days and meeting all other requirements Today's Visits Date Type Provider Dept  08/09/21 Office Visit Milinda Pointer, MD [facility]  Showing today's visits and meeting all other requirements Future Appointments No visits were found meeting these conditions. Showing future appointments within next 90 days and meeting all other requirements I discussed the assessment and treatment plan with the patient. The patient was provided an opportunity to ask questions and all were answered. The patient agreed with the plan and demonstrated an understanding of the instructions.  Patient advised to call back or seek an in-person evaluation if the symptoms or condition worsens.  Duration of encounter: 15 minutes.  Note by: Gaspar Cola, MD Date: 08/09/2021; Time: 1:51 PM

## 2021-08-10 ENCOUNTER — Telehealth: Payer: Self-pay | Admitting: Gastroenterology

## 2021-08-10 NOTE — Telephone Encounter (Signed)
Patient has already contacted Endo unit to cancel procedure.

## 2021-08-10 NOTE — Telephone Encounter (Signed)
Patient wants to cancel procedure. Clinical staff will follow up with patient.

## 2021-08-13 ENCOUNTER — Encounter: Admission: RE | Payer: Self-pay | Source: Home / Self Care

## 2021-08-13 ENCOUNTER — Ambulatory Visit
Admission: RE | Admit: 2021-08-13 | Payer: BC Managed Care – PPO | Source: Home / Self Care | Admitting: Gastroenterology

## 2021-08-13 SURGERY — COLONOSCOPY
Anesthesia: General

## 2021-08-30 ENCOUNTER — Encounter: Payer: BC Managed Care – PPO | Attending: Pain Medicine | Admitting: Dietician

## 2021-08-30 ENCOUNTER — Other Ambulatory Visit: Payer: Self-pay

## 2021-08-30 ENCOUNTER — Encounter: Payer: Self-pay | Admitting: Dietician

## 2021-08-30 DIAGNOSIS — Z6841 Body Mass Index (BMI) 40.0 and over, adult: Secondary | ICD-10-CM

## 2021-08-30 DIAGNOSIS — G894 Chronic pain syndrome: Secondary | ICD-10-CM | POA: Diagnosis not present

## 2021-08-30 NOTE — Patient Instructions (Signed)
Plan ahead before going to grocery store or making a list Look for meal ideas with minimal cooking, such as one-pan roasted meals, crock pot meals, skillet meals with only small amounts of oil. Use bariatric menus for ideas. Limit the amount of snacks like candy and chips -- Pre-portion an amount for the day/ work shift, keep the larger bag/ container farther away, wait at least 2 hours after a meal before having even a small snack. Use fruit for some snacks or at the end of a meal.  OK to have a protein shake as a breakfast, or as a nighttime snack/ light meal before going to sleep.  Continue to reduce sodas and choose water or sugar free flavored water, or sugar free iced teas such as Lipton ("light")

## 2021-08-30 NOTE — Progress Notes (Signed)
Medical Nutrition Therapy: Visit start time: 7867  end time: 1210  Assessment:  Diagnosis: obesity Past medical history: HTN, sleep apnea, DDD, sciatica, arthritis (in knees) Psychosocial issues/ stress concerns: none  Preferred learning method:  No preference indicated  Current weight: 341.7lbs Height: 5'8" BMI: 51.96 Medications, supplements: reconciled list in medical record  Progress and evaluation:  Patient reports having bariatric (gastric bypass) surgery in 2012, lost about 80lbs but gradually regained all the weight due to resuming less healthy habits.   Made some changes a few months ago (05/2021) and has lost about 17lbs, has regained about 7lbs over holidays. She reports appetite for sweets, snacks, and would like to be able to change that affinity. Her 59 year-old son has recently lost 150lbs through exercise and healthier eating; he I providing positive support for [place]. Works 1pm - 10pm then works on [employer] until about 1-2am; work hours will be changing slightly in the next few weeks.   Physical activity: no regular exercise; plans to start using on-site gym at her home along with son's support  Dietary Intake:  Usual eating pattern includes 2 meals and several snacks per day. Dining out frequency: 10-15 meals per week; does not like to cook.  Breakfast: usually asleep Lunch: usually takeout food -- 12/28 grilled chicken salad (lasted for 2 meals); 1/2 burger; 1/2 chick fila grilled chicken sandwich Snack: chips, candy Supper: often eats remainder of lunch; 12/28 [place] with dressing and gravy, greens, potato salad Late meal: often another meal at about 1-2am, then lays down in bed, reports  Snack: chips, candy (hard choc mint candy throughout the day) Beverages: water, occasional tea, 1-2 cans Dr. Malachi Bonds daily; was limiting soda to once a week  Nutrition Care Education: Topics covered:  Basic nutrition: basic food groups, appropriate nutrient balance,  appropriate meal and snack schedule, general nutrition guidelines    Weight control: importance of low sugar and low fat choices; portion control strategies including using small plates, chewing foods thoroughly, pre-portioning snacks and avoiding eating from large containers ie candy bag; emphasizing lean proteins and low carb veggies and resuming post-op rule of eating protein first, then veg, then starch or fruit; discussed appropriate carb intake and healthy choices; discussed role of physical activity  Advanced nutrition:  cooking techniques-- batch cooking, sheet-pan meals, crock pot meals Hypertension: identifying high sodium foods, appropriate sodium intake with meals and reading food labels for sodium   Nutritional Diagnosis:  [place]-3.3 Overweight/obesity As related to excess calories, inadequate physical activity.  As evidenced by patient with current BMI of 51.96, working on lifestyle changes to promote weight loss.  Intervention:  Instruction and discussion as noted above. Patient has been working on positive changes, but has struggled with relapses. She has support from her son and some friends, and is motivated to continue working on weight loss. Established goals for change with direction from patient.  Education Materials given:  Museum/gallery conservator with food lists Bariatric portion plate Bariatric sample menus for 1000-1200kcal Visit summary with goals/ instructions   Learner/ who was taught:  Patient    Level of understanding: Verbalizes/ demonstrates competency   Demonstrated degree of understanding via:   Teach back Learning barriers: None  Willingness to learn/ readiness for change: Eager, change in progress   Monitoring and Evaluation:  Dietary intake, exercise, and body weight      follow up:  10/10/21 at 9:30am

## 2021-09-30 NOTE — Progress Notes (Signed)
Patient: Sharon Holden  Service Category: E/M  Provider: Gaspar Cola, MD  DOB: 05-20-1961  DOS: 10/01/2021  Location: Office  MRN: [id number]  Setting: Ambulatory outpatient  Referring Provider: Marguerita Merles, MD  Type: Established Patient  Specialty: Interventional Pain Management  PCP: Marguerita Merles, MD  Location: Remote location  Delivery: TeleHealth     Virtual Encounter - Pain Management PROVIDER NOTE: Information contained herein reflects review and annotations entered in association with encounter. Interpretation of such information and data should be left to medically-trained personnel. Information provided to patient can be located elsewhere in the medical record under "Patient Instructions". Document created using STT-dictation technology, any transcriptional errors that may result from process are unintentional.    Contact & Pharmacy Preferred: [phone] Home: [phone] (home) Mobile: [phone] (mobile) E-mail: [email or web address]  [id number] [address], [place] [address] Phone: [phone] Fax: [phone]   Pre-screening  Sharon Holden offered "in-person" vs "virtual" encounter. She indicated preferring virtual for this encounter.   Reason COVID-19*   Social distancing based on CDC and AMA recommendations.   I contacted Sharon Holden on 10/01/2021 via telephone.      I clearly identified myself as Gaspar Cola, MD. I verified that I was speaking with the correct person using two identifiers (Name: Sharon Holden, and date of birth: 22-Nov-1960).  Consent I sought verbal advanced consent from Sharon Holden for virtual visit interactions. I informed Sharon Holden of possible security and privacy concerns, risks, and limitations associated with providing "not-in-person" medical evaluation and management services. I also informed Sharon Holden of the availability of "in-person" appointments. Finally, I  informed her that there would be a charge for the virtual visit and that she could be  personally, fully or partially, financially responsible for it. Sharon Holden expressed understanding and agreed to proceed.   Historic Elements   Sharon Holden is a 61 y.o. year old, female patient evaluated today after our last contact on 08/09/2021. Sharon Holden  has a past medical history of Arthritis, History of kidney stones, Hypertension, Kidney stones, and Sleep apnea. She also  has a past surgical history that includes Gastric bypass; Cesarean section; Ureteroscopy with holmium laser lithotripsy (Left, 06/20/2016); and Cystoscopy with stent placement (Left, 06/20/2016). Sharon Holden has a current medication list which includes the following prescription(s): etodolac, furosemide, lisinopril, and lisinopril-hydrochlorothiazide. She  reports that she has never smoked. She has never used smokeless tobacco. She reports that she does not currently use alcohol. She reports that she does not use drugs. Sharon Holden has No Known Allergies.   HPI  Today, she is being contacted for the patient indicates recurrence of her right knee pain.  On 05/22/2021 she had a right intra-articular Zilretta knee injection done which provided her with 100% relief of the pain that seems to have lasted until recently when it started coming back.  Today I took the time to offer her over options such as the viscosupplementation therapy, but she rather stay with the Zilretta, probably due to the fact that she knows how it works and that it worked well for her.  Pharmacotherapy Assessment   Opioid Analgesic: None MME/day: 0 mg/day    Monitoring: [place] PMP: PDMP reviewed during this encounter.       Pharmacotherapy: No side-effects or adverse reactions reported. Compliance: No problems identified. Effectiveness: Clinically acceptable. Plan: Refer to "POC". UDS:  Summary  Date Value Ref  Range Status  03/14/2021 Note  Final    Comment:     ==================================================================== Compliance Drug Analysis, Ur ==================================================================== Test                             Result       Flag       Units    NO DRUGS DETECTED. ==================================================================== Test                      Result    Flag   Units      Ref Range   Creatinine              183              mg/dL      >=20 ==================================================================== Declared Medications:  The flagging and interpretation on this report are based on the  following declared medications.  Unexpected results may arise from  inaccuracies in the declared medications.   **Note: The testing scope of this panel does not include the  following reported medications:   Etodolac  Furosemide  Hydrochlorothiazide (Zestoretic)  Lisinopril (Zestoretic)  Pantoprazole ==================================================================== For clinical consultation, please call [phone]. ====================================================================      Laboratory Chemistry Profile   Renal Lab Results  Component Value Date   BUN 12 03/14/2021   CREATININE 0.98 03/14/2021   GFRAA >60 02/03/2017   GFRNONAA >60 03/14/2021    Hepatic Lab Results  Component Value Date   AST 13 (L) 03/14/2021   ALT 13 03/14/2021   ALBUMIN 4.2 03/14/2021   ALKPHOS 95 03/14/2021   LIPASE 30 02/03/2017    Electrolytes Lab Results  Component Value Date   NA 139 03/14/2021   K 3.8 03/14/2021   CL 104 03/14/2021   CALCIUM 9.3 03/14/2021   MG 1.9 03/14/2021    Bone Lab Results  Component Value Date   VD25OH 8.44 (L) 03/14/2021    Inflammation (CRP: Acute Phase) (ESR: Chronic Phase) Lab Results  Component Value Date   CRP 0.6 03/14/2021   ESRSEDRATE 7 03/14/2021         Note: Above Lab results reviewed.  Imaging  DG PAIN CLINIC C-ARM 1-60 MIN NO  REPORT Fluoro was used, but no Radiologist interpretation will be provided.  Please refer to "NOTES" tab for provider progress note.  Assessment  The primary encounter diagnosis was Chronic knee pain (3ry area of Pain) (Bilateral) (R>L). Diagnoses of Osteoarthritis of knee (Right), Osteoarthritis of patellofemoral joint (Right), and Morbid obesity with BMI of 50.0-59.9, adult [facility]) were also pertinent to this visit.  Plan of Care  Problem-specific:  No problem-specific Assessment & Plan notes found for this encounter.  Ms. SOPHIAMARIE NEASE has a current medication list which includes the following long-term medication(s): furosemide, lisinopril, and lisinopril-hydrochlorothiazide.  Pharmacotherapy (Medications Ordered): No orders of the defined types were placed in this encounter.  Orders:  Orders Placed This Encounter  Procedures   KNEE INJECTION    Local Anesthetic & Steroid injection.    Standing Status:   Future    Standing Expiration Date:   12/30/2021    Scheduling Instructions:     Side: Right-sided (Zilretta)     Sedation: None     Timeframe: As soon as schedule allows    Order Specific Question:   Where will this procedure be performed?    Answer:   [facility]   Follow-up plan:  Return for (Clinic) procedure: (R) Zilretta knee injection #2, with fluoroscopy.     Interventional Therapies  Risk   Complexity Considerations:   Estimated body mass index is 52.46 kg/m as calculated from the following:   Height as of this encounter: $RemoveBeforeD'5\' 8"'[id number]$  (1.727 m).   Weight as of this encounter: 345 lb (156.5 kg). WNL   Planned   Pending:   Therapeutic right IA Zilretta knee injection #2    Under consideration:   Diagnostic bilateral lumbar facet MBB #2  Therapeutic right IA Zilretta knee injection #2  Therapeutic right IA Monovisc knee injection #1    Completed:   Diagnostic/therapeutic bilateral lumbar facet MBB x1 (07/24/2021) (100/100/80/80)  Diagnostic/therapeutic  right IA Zilretta knee injection x1 (05/22/2021) (100/100/100/100)    Therapeutic   Palliative (PRN) options:   Palliative/therapeutic right IA Zilretta knee injections  Therapeutic/palliative bilateral lumbar facet MBB     Recent Visits Date Type Provider Dept  08/09/21 Office Visit Milinda Pointer, MD [facility]  07/24/21 Procedure visit Milinda Pointer, MD [facility]  Showing recent visits within past 90 days and meeting all other requirements Today's Visits Date Type Provider Dept  10/01/21 Office Visit Milinda Pointer, MD [facility]  Showing today's visits and meeting all other requirements Future Appointments No visits were found meeting these conditions. Showing future appointments within next 90 days and meeting all other requirements  I discussed the assessment and treatment plan with the patient. The patient was provided an opportunity to ask questions and all were answered. The patient agreed with the plan and demonstrated an understanding of the instructions.  Patient advised to call back or seek an in-person evaluation if the symptoms or condition worsens.  Duration of encounter: 15 minutes.  Note by: Gaspar Cola, MD Date: 10/01/2021; Time: 3:32 PM

## 2021-10-01 ENCOUNTER — Other Ambulatory Visit: Payer: Self-pay

## 2021-10-01 ENCOUNTER — Ambulatory Visit: Payer: BC Managed Care – PPO | Attending: Pain Medicine | Admitting: Pain Medicine

## 2021-10-01 DIAGNOSIS — M25562 Pain in left knee: Secondary | ICD-10-CM

## 2021-10-01 DIAGNOSIS — M25561 Pain in right knee: Secondary | ICD-10-CM | POA: Diagnosis not present

## 2021-10-01 DIAGNOSIS — G8929 Other chronic pain: Secondary | ICD-10-CM

## 2021-10-01 DIAGNOSIS — M1711 Unilateral primary osteoarthritis, right knee: Secondary | ICD-10-CM

## 2021-10-01 DIAGNOSIS — Z6841 Body Mass Index (BMI) 40.0 and over, adult: Secondary | ICD-10-CM

## 2021-10-09 ENCOUNTER — Encounter: Payer: Self-pay | Admitting: Pain Medicine

## 2021-10-09 ENCOUNTER — Ambulatory Visit
Admission: RE | Admit: 2021-10-09 | Discharge: 2021-10-09 | Disposition: A | Discharge status: Home / Self Care | Payer: BC Managed Care – PPO | Source: Ambulatory Visit | Attending: Pain Medicine | Admitting: Pain Medicine

## 2021-10-09 ENCOUNTER — Ambulatory Visit (HOSPITAL_BASED_OUTPATIENT_CLINIC_OR_DEPARTMENT_OTHER): Payer: BC Managed Care – PPO | Admitting: Pain Medicine

## 2021-10-09 ENCOUNTER — Other Ambulatory Visit: Payer: Self-pay

## 2021-10-09 VITALS — BP 164/86 | HR 77 | Temp 97.5°F | Resp 16 | Ht 67.5 in | Wt 335.0 lb

## 2021-10-09 DIAGNOSIS — Z6841 Body Mass Index (BMI) 40.0 and over, adult: Secondary | ICD-10-CM | POA: Insufficient documentation

## 2021-10-09 DIAGNOSIS — M1711 Unilateral primary osteoarthritis, right knee: Secondary | ICD-10-CM | POA: Diagnosis present

## 2021-10-09 DIAGNOSIS — M25562 Pain in left knee: Secondary | ICD-10-CM

## 2021-10-09 DIAGNOSIS — M25561 Pain in right knee: Secondary | ICD-10-CM

## 2021-10-09 DIAGNOSIS — G8929 Other chronic pain: Secondary | ICD-10-CM

## 2021-10-09 MED ORDER — MIDAZOLAM HCL 5 MG/5ML IJ SOLN: 0.5000 mg | Freq: Once | INTRAMUSCULAR | Status: DC

## 2021-10-09 MED ORDER — LIDOCAINE HCL 2 % IJ SOLN
20.0000 mL | Freq: Once | INTRAMUSCULAR | Status: AC
  Administered 2021-10-09: 400 mg
  Filled 2021-10-09: qty 20

## 2021-10-09 MED ORDER — PENTAFLUOROPROP-TETRAFLUOROETH EX AERO
INHALATION_SPRAY | Freq: Once | CUTANEOUS | Status: AC
  Administered 2021-10-09: 30 via TOPICAL
  Filled 2021-10-09: qty 116

## 2021-10-09 MED ORDER — ROPIVACAINE HCL 2 MG/ML IJ SOLN
INTRAMUSCULAR | Status: AC
  Filled 2021-10-09: qty 20

## 2021-10-09 MED ORDER — ROPIVACAINE HCL 2 MG/ML IJ SOLN
5.0000 mL | Freq: Once | INTRAMUSCULAR | Status: AC
  Administered 2021-10-09: 5 mL via INTRA_ARTICULAR

## 2021-10-09 MED ORDER — TRIAMCINOLONE ACETONIDE 32 MG IX SRER
32.0000 mg | Freq: Once | INTRA_ARTICULAR | Status: AC
  Administered 2021-10-09: 32 mg via INTRA_ARTICULAR
  Filled 2021-10-09: qty 5

## 2021-10-09 MED ORDER — LACTATED RINGERS IV SOLN: 1000.0000 mL | Freq: Once | INTRAVENOUS | Status: DC

## 2021-10-09 MED ORDER — LIDOCAINE HCL (PF) 1 % IJ SOLN
5.0000 mL | Freq: Once | INTRAMUSCULAR | Status: AC
  Administered 2021-10-09: 5 mL
  Filled 2021-10-09: qty 5

## 2021-10-09 MED ORDER — LIDOCAINE HCL (PF) 2 % IJ SOLN
3.0000 mL | Freq: Once | INTRAMUSCULAR | Status: AC
  Administered 2021-10-09: 3 mL
  Filled 2021-10-09: qty 5

## 2021-10-09 NOTE — Progress Notes (Signed)
Safety precautions to be maintained throughout the outpatient stay will include: orient to surroundings, keep bed in low position, maintain call bell within reach at all times, provide assistance with transfer out of bed and ambulation.  

## 2021-10-09 NOTE — Progress Notes (Signed)
PROVIDER NOTE: Interpretation of information contained herein should be left to medically-trained personnel. Specific patient instructions are provided elsewhere under "Patient Instructions" section of medical record. This document was created in part using STT-dictation technology, any transcriptional errors that may result from this process are unintentional.  Patient: Sharon Holden Type: Established DOB: 04/19/1961 MRN: [id number] PCP: Marguerita Merles, MD  Service: Procedure DOS: 10/09/2021 Setting: Ambulatory Location: Ambulatory outpatient facility Delivery: Face-to-face Provider: Gaspar Cola, MD Specialty: Interventional Pain Management Specialty designation: 09 Location: Outpatient facility Ref. Prov.: Milinda Pointer, MD    Primary Reason for Visit: Interventional Pain Management Treatment. CC: Knee Pain (Right )   Procedure:           Type: ER-steroid (Zilretta) Intra-articular Knee Injection Laterality: Right (-RT) No.: 2   Series: 1 Level/approach: Lateral Imaging guidance: None required ([id number]) Anesthesia: Local anesthesia (1-2% Lidocaine) Anxiolysis: None                 Sedation: None.  Purpose: Diagnostic/Therapeutic Indications: Knee arthralgia associated to osteoarthritis of the knee 1. Chronic knee pain (3ry area of Pain) (Bilateral) (R>L)   2. Osteoarthritis of knee (Right)   3. Osteoarthritis of patellofemoral joint (Right)   4. Morbid obesity with BMI of 50.0-59.9, adult ([place])    NAS-11 score:   Pre-procedure: 8 /10   Post-procedure: 2 /10     Pre-Procedure Preparation  Monitoring: As per clinic protocol.  Risk Assessment: Vitals:  WEX:[id number] body mass index is 51.69 kg/m as calculated from the following:   Height as of this encounter: 5' 7.5" (1.715 m).   Weight as of this encounter: 335 lb (152 kg)., Rate:77 , BP:(!) 164/86, Resp:16, Temp:(!) 97.5 F (36.4 C), SpO2:100 %  Allergies: She has No Known Allergies.  Precautions: No  additional precautions required  Blood-thinner(s): None at this time  Coagulopathies: Reviewed. None identified.   Active Infection(s): Reviewed. None identified. Ms. Sheperd is afebrile   Location setting: Exam room Position: Sitting w/ knee bent 90 degrees Safety Precautions: Patient was assessed for positional comfort and pressure points before starting the procedure. Prepping solution: DuraPrep (Iodine Povacrylex [0.7% available iodine] and Isopropyl Alcohol, 74% w/w) Prep Area: Entire knee region Approach: percutaneous, just above the tibial plateau, lateral to the infrapatellar tendon. Intended target: Intra-articular knee space Materials: Tray: Block Needle(s): Regular Qty: 1/side Length: 1.5-inch Gauge: 25G   Meds ordered this encounter  Medications   lidocaine (XYLOCAINE) 2 % (with pres) injection 400 mg   pentafluoroprop-tetrafluoroeth (GEBAUERS) aerosol   DISCONTD: lactated ringers infusion 1,000 mL   DISCONTD: midazolam (VERSED) 5 MG/5ML injection 0.5-2 mg    Make sure Flumazenil is available in the pyxis when using this medication. If oversedation occurs, administer 0.2 mg IV over 15 sec. If after 45 sec no response, administer 0.2 mg again over 1 min; may repeat at 1 min intervals; not to exceed 4 doses (1 mg)   Triamcinolone Acetonide SRER 32 mg    Maintain refrigerated.  Prepared suspension may be stored up to 4 hours at ambient conditions.   lidocaine HCl (PF) (XYLOCAINE) 2 % injection 3 mL   lidocaine (PF) (XYLOCAINE) 1 % injection 5 mL   ropivacaine (PF) 2 mg/mL (0.2%) (NAROPIN) injection 5 mL    Orders Placed This Encounter  Procedures   KNEE INJECTION    Local Anesthetic & Steroid injection.    Scheduling Instructions:     Side(s): Right Knee     Sedation: None  Timeframe: Today    Order Specific Question:   Where will this procedure be performed?    Answer:   [facility]   DG PA[facility] C-ARM 1-60 MIN NO REPORT    Intraoperative  interpretation by procedural physician at [facility].    Standing Status:   Standing    Number of Occurrences:   1    Order Specific Question:   Reason for exam:    Answer:   Assistance in needle guidance and placement for procedures requiring needle placement in or near specific anatomical locations not easily accessible without such assistance.   Informed Consent Details: Physician/Practitioner Attestation; Transcribe to consent form and obtain patient signature    Note: Always confirm laterality of pain with Ms. Liese, before procedure. Transcribe to consent form and obtain patient signature.    Order Specific Question:   Physician/Practitioner attestation of informed consent for procedure/surgical case    Answer:   I, the physician/practitioner, attest that I have discussed with the patient the benefits, risks, side effects, alternatives, likelihood of achieving goals and potential problems during recovery for the procedure that I have provided informed consent.    Order Specific Question:   Procedure    Answer:   Right-sided intra-articular knee arthrocentesis (aspiration and/or injection)    Order Specific Question:   Physician/Practitioner performing the procedure    Answer:   Geral Tuch A. Dossie Arbour, MD    Order Specific Question:   Indication/Reason    Answer:   Chronic right-sided knee pain secondary to knee arthropathy/arthralgia   Provide equipment / supplies at bedside    "Block Tray" (Disposable   single use) Needle type: SpinalSpinal Amount/quantity: 1 Size: Regular (3.5-inch) Gauge: 22G    Standing Status:   Standing    Number of Occurrences:   1    Order Specific Question:   Specify    Answer:   Block Tray     Time-out: 1210 I initiated and conducted the "Time-out" before starting the procedure, as per protocol. The patient was asked to participate by confirming the accuracy of the "Time Out" information. Verification of the correct person, site, and procedure  were performed and confirmed by me, the nursing staff, and the patient. "Time-out" conducted as per Joint Commission's Universal Protocol (UP.01.01.01). Procedure checklist: Completed   H&P (Pre-op  Assessment)  Ms. Winfree is a 61 y.o. (year old), female patient, seen today for interventional treatment. She  has a past surgical history that includes Gastric bypass; Cesarean section; Ureteroscopy with holmium laser lithotripsy (Left, 06/20/2016); and Cystoscopy with stent placement (Left, 06/20/2016). Ms. Grayer has a current medication list which includes the following prescription(s): etodolac, furosemide, lisinopril, and lisinopril-hydrochlorothiazide. Her primarily concern today is the Knee Pain (Right )  She has No Known Allergies.   Last encounter: My last encounter with her was on 10/01/2021. Pertinent problems: Ms. Hobson has Chronic fatigue; Chronic pain syndrome; Chronic knee pain (3ry area of Pain) (Bilateral) (R>L); DDD (degenerative disc disease), thoracic; DDD (degenerative disc disease), lumbosacral; Chronic lower extremity pain (2ry area of Pain) (Bilateral) (R>L); Chronic low back pain (1ry area of Pain) (Bilateral) (R>L) w/o sciatica; Osteoarthritis of knee (Right); Osteoarthritis of patellofemoral joint (Right); Lumbar facet syndrome (Bilateral); Spondylosis without myelopathy or radiculopathy, lumbosacral region; and Secondary osteoarthritis of multiple sites on their pertinent problem list. Pain Assessment: Severity of Chronic pain is reported as a 8 /10. Location: Knee Left (standing/moving)/down the outside of right leg x 1 month not sure if this is  r/t knee pain. Onset: More than a month ago. Quality: Discomfort, Constant, Sharp. Timing: Constant. Modifying factor(s): rest. Vitals:  height is 5' 7.5" (1.715 m) and weight is 335 lb (152 kg) (abnormal). Her temporal temperature is 97.5 F (36.4 C) (abnormal). Her blood pressure is 164/86 (abnormal) and her pulse is 77. Her respiration  is 16 and oxygen saturation is 100%.   Reason for encounter: "interventional pain management therapy due pain of at least four (4) weeks in duration, with failure to respond and/or inability to tolerate more conservative care.   Site Confirmation: Ms. Osborne was asked to confirm the procedure and laterality before marking the site.  Consent: Before the procedure and under the influence of no sedative(s), amnesic(s), or anxiolytics, the patient was informed of the treatment options, risks and possible complications. To fulfill our ethical and legal obligations, as recommended by the American Medical Association's Code of Ethics, I have informed the patient of my clinical impression; the nature and purpose of the treatment or procedure; the risks, benefits, and possible complications of the intervention; the alternatives, including doing nothing; the risk(s) and benefit(s) of the alternative treatment(s) or procedure(s); and the risk(s) and benefit(s) of doing nothing. The patient was provided information about the general risks and possible complications associated with the procedure. These may include, but are not limited to: failure to achieve desired goals, infection, bleeding, organ or nerve damage, allergic reactions, paralysis, and death. In addition, the patient was informed of those risks and complications associated to Spine-related procedures, such as failure to decrease pain; infection (i.e.: Meningitis, epidural or intraspinal abscess); bleeding (i.e.: epidural hematoma, subarachnoid hemorrhage, or any other type of intraspinal or peri-dural bleeding); organ or nerve damage (i.e.: Any type of peripheral nerve, nerve root, or spinal cord injury) with subsequent damage to sensory, motor, and/or autonomic systems, resulting in permanent pain, numbness, and/or weakness of one or several areas of the body; allergic reactions; (i.e.: anaphylactic reaction); and/or death. Furthermore, the patient was  informed of those risks and complications associated with the medications. These include, but are not limited to: allergic reactions (i.e.: anaphylactic or anaphylactoid reaction(s)); adrenal axis suppression; blood sugar elevation that in diabetics may result in ketoacidosis or comma; water retention that in patients with history of congestive heart failure may result in shortness of breath, pulmonary edema, and decompensation with resultant heart failure; weight gain; swelling or edema; medication-induced neural toxicity; particulate matter embolism and blood vessel occlusion with resultant organ, and/or nervous system infarction; and/or aseptic necrosis of one or more joints. Finally, the patient was informed that Medicine is not an exact science; therefore, there is also the possibility of unforeseen or unpredictable risks and/or possible complications that may result in a catastrophic outcome. The patient indicated having understood very clearly. We have given the patient no guarantees and we have made no promises. Enough time was given to the patient to ask questions, all of which were answered to the patient's satisfaction. Ms. Selke has indicated that she wanted to continue with the procedure. Attestation: I, the ordering provider, attest that I have discussed with the patient the benefits, risks, side-effects, alternatives, likelihood of achieving goals, and potential problems during recovery for the procedure that I have provided informed consent.  Date   Time: 10/09/2021 11:58 AM   Prophylactic antibiotics  Anti-infectives (From admission, onward)    None      Indication(s): None identified   Description of procedure   Start Time: 1210 hrs  Local Anesthesia: Once the  patient was positioned, prepped, and time-out was completed. The target area was identified located. The skin was marked with an approved surgical skin marker. Once marked, the skin (epidermis, dermis, and hypodermis), and  deeper tissues (fat, connective tissue and muscle) were infiltrated with a small amount of a short-acting local anesthetic, loaded on a 10cc syringe with a 25G, 1.5-in  Needle. An appropriate amount of time was allowed for local anesthetics to take effect before proceeding to the next step. Local Anesthetic: Lidocaine 1-2% The unused portion of the local anesthetic was discarded in the proper designated containers. Safety Precautions: Aspiration looking for blood return was conducted prior to all injections. At no point did I inject any substances, as a needle was being advanced. Before injecting, the patient was told to immediately notify me if she was experiencing any new onset of "ringing in the ears, or metallic taste in the mouth". No attempts were made at seeking any paresthesias. Safe injection practices and needle disposal techniques used. Medications properly checked for expiration dates. SDV (single dose vial) medications used. After the completion of the procedure, all disposable equipment used was discarded in the proper designated medical waste containers.  Technical description: Protocol guidelines were followed. After positioning, the target area was identified and prepped in the usual manner. Skin & deeper tissues infiltrated with local anesthetic. Appropriate amount of time allowed to pass for local anesthetics to take effect. Proper needle placement secured. Once satisfactory needle placement was confirmed, I proceeded to inject the desired solution in slow, incremental fashion, intermittently assessing for discomfort or any signs of abnormal or undesired spread of substance. Once completed, the needle was removed and disposed of, as per hospital protocols. The area was cleaned, making sure to leave some of the prepping solution back to take advantage of its long term bactericidal properties.  Aspiration:  Negative   Vitals:   10/09/21 1154  BP: (!) 164/86  Pulse: 77  Resp: 16  Temp:  (!) 97.5 F (36.4 C)  TempSrc: Temporal  SpO2: 100%  Weight: (!) 335 lb (152 kg)  Height: 5' 7.5" (1.715 m)    End Time: 1214 hrs   Imaging guidance  Imaging-assisted Technique: Fluoroscopy Guidance (Non-spinal) Indication(s): Morbid obesity. Assistance in needle guidance and placement for procedures requiring needle placement in or near specific anatomical locations impossible to access without such assistance. Exposure Time: Please see nurses notes. Contrast: None Fluoroscopic Guidance: I was personally present in the fluoroscopy suite, where the patient was placed in position for the procedure, over the fluoroscopy-compatible table. Fluoroscopy was manipulated, using "Tunnel Vision Technique", to obtain the best possible view of the target area, on the affected side. Parallax error was corrected before commencing the procedure. A "direction-depth-direction" technique was used to introduce the needle under continuous pulsed fluoroscopic guidance. Once the target was reached, antero-posterior, oblique, and lateral fluoroscopic projection views were taken to confirm needle placement in all planes. Electronic images uploaded into EMR. Ultrasound Guidance: N/A Interpretation: Electronic images uploaded into EMR.  Successful intra-articular right knee injection into the lateral compartment.  Images taken AP with knee bent 45 degrees.  Severe right knee osteoarthritis with near complete loss of intra-articular cartilage.  Patient made aware of findings.   Post-op assessment  Post-procedure Vital Signs:  Pulse/HCG Rate: 77  Temp: (!) 97.5 F (36.4 C) Resp: 16 BP: (!) 164/86 SpO2: 100 %  EBL: None  Complications: No immediate post-treatment complications observed by team, or reported by patient.  Note: The patient tolerated  the entire procedure well. A repeat set of vitals were taken after the procedure and the patient was kept under observation following institutional policy, for this type  of procedure. Post-procedural neurological assessment was performed, showing return to baseline, prior to discharge. The patient was provided with post-procedure discharge instructions, including a section on how to identify potential problems. Should any problems arise concerning this procedure, the patient was given instructions to immediately contact us, at any time, without hesitation. In any case, we plan to contact the patient by telephone for a follow-up status report regarding this interventional procedure.  Comments:  No additional relevant information.   Plan of care  Chronic Opioid Analgesic:  None MME/day: 0 mg/day    Medications administered: We administered lidocaine, pentafluoroprop-tetrafluoroeth, Triamcinolone Acetonide, lidocaine HCl (PF), lidocaine (PF), and ropivacaine (PF) 2 mg/mL (0.2%).  Follow-up plan:   Return in about 2 weeks (around 10/23/2021) for Proc-day (T,Th), (VV), (PPE).      Interventional Therapies  Risk   Complexity Considerations:   Estimated body mass index is 52.46 kg/m as calculated from the following:   Height as of this encounter: 5\' 8"  (1.727 m).   Weight as of this encounter: 345 lb (156.5 kg). WNL   Planned   Pending:   Therapeutic right IA Zilretta knee injection #2    Under consideration:   Diagnostic bilateral lumbar facet MBB #2  Therapeutic right IA Zilretta knee injection #2  Therapeutic right IA Monovisc knee injection #1    Completed:   Diagnostic/therapeutic bilateral lumbar facet MBB x1 (07/24/2021) (100/100/80/80)  Diagnostic/therapeutic right IA Zilretta knee injection x1 (05/22/2021) (100/100/100/100)    Therapeutic   Palliative (PRN) options:   Palliative/therapeutic right IA Zilretta knee injections  Therapeutic/palliative bilateral lumbar facet MBB      Recent Visits Date Type Provider Dept  10/01/21 Office Visit Milinda Pointer, MD [facility]  08/09/21 Office Visit Milinda Pointer, MD [facility]  07/24/21 Procedure visit Milinda Pointer, MD [facility]  Showing recent visits within past 90 days and meeting all other requirements Today's Visits Date Type Provider Dept  10/09/21 Procedure visit Milinda Pointer, MD [facility]  Showing today's visits and meeting all other requirements Future Appointments Date Type Provider Dept  10/23/21 Appointment Milinda Pointer, MD [facility]  Showing future appointments within next 90 days and meeting all other requirements   Disposition: Discharge home  Discharge (Date   Time): 10/09/2021; 1223 hrs.   Primary Care Physician: Marguerita Merles, MD Location: [facility] Note by: Gaspar Cola, MD Date: 10/09/2021; Time: 12:31 PM  DISCLAIMER: Medicine is not an Chief Strategy Officer. It has no guarantees or warranties. The decision to proceed with this intervention was based on the information collected from the patient. Conclusions were drawn from the patient's questionnaire, interview, and examination. Because information was provided in large part by the patient, it cannot be guaranteed that it has not been purposely or unconsciously manipulated or altered. Every effort has been made to obtain as much accurate, relevant, available data as possible. Always take into account that the treatment will also be dependent on availability of resources and existing treatment guidelines, considered by other Pain Management Specialists as being common knowledge and practice, at the time of the intervention. It is also important to point out that variation in procedural techniques and pharmacological choices are the acceptable norm. For Medico-Legal review purposes, the indications, contraindications, technique, and results of the these procedures should  only be evaluated, judged and interpreted by a Board-Certified Interventional Pain Specialist with extensive familiarity and expertise in  the same exact procedure and technique.

## 2021-10-09 NOTE — Patient Instructions (Addendum)
____________________________________________________________________________________________  Post-Procedure Discharge Instructions  Instructions: Apply ice:  Purpose: This will minimize any swelling and discomfort after procedure.  When: Day of procedure, as soon as you get home. How: Fill a plastic sandwich bag with crushed ice. Cover it with a small towel and apply to injection site. How long: (15 min on, 15 min off) Apply for 15 minutes then remove x 15 minutes.  Repeat sequence on day of procedure, until you go to bed. Apply heat:  Purpose: To treat any soreness and discomfort from the procedure. When: Starting the next day after the procedure. How: Apply heat to procedure site starting the day following the procedure. How long: May continue to repeat daily, until discomfort goes away. Food intake: Start with clear liquids (like water) and advance to regular food, as tolerated.  Physical activities: Keep activities to a minimum for the first 8 hours after the procedure. After that, then as tolerated. Driving: If you have received any sedation, be responsible and do not drive. You are not allowed to drive for 24 hours after having sedation. Blood thinner: (Applies only to those taking blood thinners) You may restart your blood thinner 6 hours after your procedure. Insulin: (Applies only to Diabetic patients taking insulin) As soon as you can eat, you may resume your normal dosing schedule. Infection prevention: Keep procedure site clean and dry. Shower daily and clean area with soap and water. Post-procedure Pain Diary: Extremely important that this be done correctly and accurately. Recorded information will be used to determine the next step in treatment. For the purpose of accuracy, follow these rules: Evaluate only the area treated. Do not report or include pain from an untreated area. For the purpose of this evaluation, ignore all other areas of pain, except for the treated area. After  your procedure, avoid taking a long nap and attempting to complete the pain diary after you wake up. Instead, set your alarm clock to go off every hour, on the hour, for the initial 8 hours after the procedure. Document the duration of the numbing medicine, and the relief you are getting from it. Do not go to sleep and attempt to complete it later. It will not be accurate. If you received sedation, it is likely that you were given a medication that may cause amnesia. Because of this, completing the diary at a later time may cause the information to be inaccurate. This information is needed to plan your care. Follow-up appointment: Keep your post-procedure follow-up evaluation appointment after the procedure (usually 2 weeks for most procedures, 6 weeks for radiofrequencies). DO NOT FORGET to bring you pain diary with you.   Expect: (What should I expect to see with my procedure?) From numbing medicine (AKA: Local Anesthetics): Numbness or decrease in pain. You may also experience some weakness, which if present, could last for the duration of the local anesthetic. Onset: Full effect within 15 minutes of injected. Duration: It will depend on the type of local anesthetic used. On the average, 1 to 8 hours.  From steroids (Applies only if steroids were used): Decrease in swelling or inflammation. Once inflammation is improved, relief of the pain will follow. Onset of benefits: Depends on the amount of swelling present. The more swelling, the longer it will take for the benefits to be seen. In some cases, up to 10 days. Duration: Steroids will stay in the system x 2 weeks. Duration of benefits will depend on multiple posibilities including persistent irritating factors. Side-effects: If present, they  may typically last 2 weeks (the duration of the steroids). Frequent: Cramps (if they occur, drink Gatorade and take over-the-counter Magnesium 450-500 mg once to twice a day); water retention with temporary  weight gain; increases in blood sugar; decreased immune system response; increased appetite. Occasional: Facial flushing (red, warm cheeks); mood swings; menstrual changes. Uncommon: Long-term decrease or suppression of natural hormones; bone thinning. (These are more common with higher doses or more frequent use. This is why we prefer that our patients avoid having any injection therapies in other practices.)  Very Rare: Severe mood changes; psychosis; aseptic necrosis. From procedure: Some discomfort is to be expected once the numbing medicine wears off. This should be minimal if ice and heat are applied as instructed.  Call if: (When should I call?) You experience numbness and weakness that gets worse with time, as opposed to wearing off. New onset bowel or bladder incontinence. (Applies only to procedures done in the spine)  Emergency Numbers: Durning business hours (Monday - Thursday, 8:00 AM - 4:00 PM) (Friday, 9:00 AM - 12:00 Noon): [phone] After hours: [phone] NOTE: If you are having a problem and are unable connect with, or to talk to a provider, then go to your nearest urgent care or emergency department. If the problem is serious and urgent, please call 911. ____________________________________________________________________________________________  ______________________________________________________________________________________________  Body mass index (BMI)  Body mass index (BMI) is a common tool for deciding whether a person has an appropriate body weight.  It measures a persons weight in relation to their height.   According to the [facility] of health (NIH): A BMI of less than 18.5 means that a person is underweight. A BMI of between 18.5 and 24.9 is ideal. A BMI of between 25 and 29.9 is overweight. A BMI over 30 indicates obesity.  Weight Management Required  URGENT: Your weight has been found to be adversely affecting your health.  Dear  Sharon Holden:  Your current Estimated body mass index is 51.96 kg/m as calculated from the following:   Height as of 08/30/21: $RemoveBefo'5\' 8"'xbWLHnElhfr$  (1.727 m).   Weight as of 08/30/21: 341 lb 11.2 oz (155 kg).  Please use the table below to identify your weight category and associated incidence of chronic pain, secondary to your weight.  Body Mass Index (BMI) Classification BMI level (kg/m2) Category Associated incidence of chronic pain  <18  Underweight   18.5-24.9 Ideal body weight   25-29.9 Overweight  20%  30-34.9 Obese (Class I)  68%  35-39.9 Severe obesity (Class II)  136%  >40 Extreme obesity (Class III)  254%   In addition: You will be considered "Morbidly Obese", if your BMI is above 30 and you have one or more of the following conditions which are known to be caused and/or directly associated with obesity: 1.    Type 2 Diabetes (Which in turn can lead to cardiovascular diseases (CVD), stroke, peripheral vascular diseases (PVD), retinopathy, nephropathy, and neuropathy) 2.    Cardiovascular Disease (High Blood Pressure; Congestive Heart Failure; High Cholesterol; Coronary Artery Disease; Angina; or History of Heart Attacks) 3.    Breathing problems (Asthma; obesity-hypoventilation syndrome; obstructive sleep apnea; chronic inflammatory airway disease; reactive airway disease; or shortness of breath) 4.    Chronic kidney disease 5.    Liver disease (nonalcoholic fatty liver disease) 6.    High blood pressure 7.    Acid reflux (gastroesophageal reflux disease; heartburn) 8.    Osteoarthritis (OA) (with any of the following: hip pain;  knee pain; and/or low back pain) 9.    Low back pain (Lumbar Facet Syndrome; and/or Degenerative Disc Disease) 10.  Hip pain (Osteoarthritis of hip) (For every 1 lbs of added body weight, there is a 2 lbs increase in pressure inside of each hip articulation. 1:2 mechanical relationship) 11.  Knee pain (Osteoarthritis of knee) (For every 1 lbs of added body weight,  there is a 4 lbs increase in pressure inside of each knee articulation. 1:4 mechanical relationship) (patients with a BMI>30 kg/m2 were 6.8 times more likely to develop knee OA than normal-weight individuals) 12.  Cancer: Epidemiological studies have shown that obesity is a risk factor for: post-menopausal breast cancer; cancers of the endometrium, colon and kidney cancer; malignant adenomas of the oesophagus. Obese subjects have an approximately 1.5-3.5-fold increased risk of developing these cancers compared with normal-weight subjects, and it has been estimated that between 15 and 45% of these cancers can be attributed to overweight. More recent studies suggest that obesity may also increase the risk of other types of cancer, including pancreatic, hepatic and gallbladder cancer. (Ref: Obesity and cancer. Pischon T, Nthlings U, [employer] H. Proc Nutr Soc. 2008 May;67(2):128-45. doi: 09.6283/M6294765465035465.) The International Agency for Research on Cancer (IARC) has identified 13 cancers associated with overweight and obesity: meningioma, multiple myeloma, adenocarcinoma of the esophagus, and cancers of the thyroid, postmenopausal breast cancer, gallbladder, stomach, liver, pancreas, kidney, ovaries, uterus, colon and rectal (colorectal) cancers. 66 percent of all cancers diagnosed in women and 24 percent of those diagnosed in men are associated with overweight and obesity.  Recommendation: At this point it is urgent that you take a step back and concentrate in loosing weight. Dedicate 100% of your efforts on this task. Nothing else will improve your health more than bringing your weight down and your BMI to less than 30. If you are here, you probably have chronic pain. We know that most chronic pain patients have difficulty exercising secondary to their pain. For this reason, you must rely on proper nutrition and diet in order to lose the weight. If your BMI is above 40, you should seriously consider bariatric  surgery. A realistic goal is to lose 10% of your body weight over a period of 12 months.  Be honest to yourself, if over time you have unsuccessfully tried to lose weight, then it is time for you to seek professional help and to enter a medically supervised weight management program, and/or undergo bariatric surgery. Stop procrastinating.   Pain management considerations:  1.    Pharmacological Problems: Be advised that the use of opioid analgesics (oxycodone; hydrocodone; morphine; methadone; codeine; and all of their derivatives) have been associated with decreased metabolism and weight gain.  For this reason, should we see that you are unable to lose weight while taking these medications, it may become necessary for [place] to taper down and indefinitely discontinue them.  2.    Technical Problems: The incidence of successful interventional therapies decreases as the patient's BMI increases. It is much more difficult to accomplish a safe and effective interventional therapy on a patient with a BMI above 35. 3.    Radiation Exposure Problems: The x-rays machine, used to accomplish injection therapies, will automatically increase their x-ray output in order to capture an appropriate bone image. This means that radiation exposure increases exponentially with the patient's BMI. (The higher the BMI, the higher the radiation exposure.) Although the level of radiation used at a given time is still safe to the patient,  it is not for the physician and/or assisting staff. Unfortunately, radiation exposure is accumulative. Because physicians and the staff have to do procedures and be exposed on a daily basis, this can result in health problems such as cancer and radiation burns. Radiation exposure to the staff is monitored by the radiation batches that they wear. The exposure levels are reported back to the staff on a quarterly basis. Depending on levels of exposure, physicians and staff may be obligated by law to decrease  this exposure. This means that they have the right and obligation to refuse providing therapies where they may be overexposed to radiation. For this reason, physicians may decline to offer therapies such as radiofrequency ablation or implants to patients with a BMI above 40. 4.    Current Trends: Be advised that the current trend is to no longer offer certain therapies to patients with a BMI equal to, or above 35, due to increase perioperative risks, increased technical procedural difficulties, and excessive radiation exposure to healthcare personnel.  ______________________________________________________________________________________________

## 2021-10-10 ENCOUNTER — Ambulatory Visit: Payer: BC Managed Care – PPO | Admitting: Dietician

## 2021-10-10 ENCOUNTER — Telehealth: Payer: Self-pay | Admitting: *Deleted

## 2021-10-10 NOTE — Telephone Encounter (Signed)
No problems post procedure. 

## 2021-10-22 NOTE — Progress Notes (Signed)
Patient: Sharon Holden  Service Category: E/M  Provider: Gaspar Cola, MD  DOB: 1961/03/06  DOS: 10/23/2021  Location: Office  MRN: [id number]  Setting: Ambulatory outpatient  Referring Provider: Marguerita Merles, MD  Type: Established Patient  Specialty: Interventional Pain Management  PCP: Marguerita Merles, MD  Location: Remote location  Delivery: TeleHealth     Virtual Encounter - Pain Management PROVIDER NOTE: Information contained herein reflects review and annotations entered in association with encounter. Interpretation of such information and data should be left to medically-trained personnel. Information provided to patient can be located elsewhere in the medical record under "Patient Instructions". Document created using STT-dictation technology, any transcriptional errors that may result from process are unintentional.    Contact & Pharmacy Preferred: [phone] Home: [phone] (home) Mobile: [phone] (mobile) E-mail: [email or web address]  [id number] [address], [place] [address] Phone: [phone] Fax: [phone]   Pre-screening  Ms. Sharon Holden offered "in-person" vs "virtual" encounter. She indicated preferring virtual for this encounter.   Reason COVID-19*   Social distancing based on CDC and AMA recommendations.   I contacted Sharon Holden on 10/23/2021 via telephone.      I clearly identified myself as Gaspar Cola, MD. I verified that I was speaking with the correct person using two identifiers (Name: Sharon Holden, and date of birth: 08-12-1961).  Consent I sought verbal advanced consent from Sharon Holden for virtual visit interactions. I informed Sharon Holden of possible security and privacy concerns, risks, and limitations associated with providing "not-in-person" medical evaluation and management services. I also informed Sharon Holden of the availability of "in-person" appointments. Finally, I  informed her that there would be a charge for the virtual visit and that she could be  personally, fully or partially, financially responsible for it. Sharon Holden expressed understanding and agreed to proceed.   Historic Elements   Sharon Holden is a 61 y.o. year old, female patient evaluated today after our last contact on 10/09/2021. Sharon Holden  has a past medical history of Arthritis, History of kidney stones, Hypertension, Kidney stones, and Sleep apnea. She also  has a past surgical history that includes Gastric bypass; Cesarean section; Ureteroscopy with holmium laser lithotripsy (Left, 06/20/2016); and Cystoscopy with stent placement (Left, 06/20/2016). Sharon Holden has a current medication list which includes the following prescription(s): etodolac and furosemide. She  reports that she has never smoked. She has never used smokeless tobacco. She reports that she does not currently use alcohol. She reports that she does not use drugs. Sharon Holden has No Known Allergies.   HPI  Today, she is being contacted for a post-procedure assessment.  Contrary to the first Zilretta injection which did provide the patient with complete relief of her right knee pain, this one did not provide her any short-term or long-term benefit.  X-rays done during the procedure confirm they knee to be at a point where we may already be "bone-on-bone".  Today we will be ordering an MRI for her right knee to confirm if this is the case.  See images from the procedure below.    Post-procedure evaluation    Type: ER-steroid (Zilretta) Intra-articular Knee Injection Laterality: Right (-RT) No.: 2   Series: 1 Level/approach: Lateral Imaging guidance: None required (CPT-20610) Anesthesia: Local anesthesia (1-2% Lidocaine) Anxiolysis: None                 Sedation: None.  Purpose: Diagnostic/Therapeutic  Indications: Knee arthralgia associated to osteoarthritis of the knee 1. Chronic knee pain (3ry area of Pain)  (Bilateral) (R>L)   2. Osteoarthritis of knee (Right)   3. Osteoarthritis of patellofemoral joint (Right)   4. Morbid obesity with BMI of 50.0-59.9, adult (HCC)    NAS-11 score:   Pre-procedure: 8 /10   Post-procedure: 2 /10    Effectiveness:  Initial hour after procedure: 0 %. Subsequent 4-6 hours post-procedure: 0 %. Analgesia past initial 6 hours: 0 %. Ongoing improvement:  Analgesic: The patient indicates that contrary to the first injection that she had that gave her such good relief, this one did not.  Function: No improvement ROM: No improvement  Pharmacotherapy Assessment   Opioid Analgesic: None MME/day: 0 mg/day    Monitoring: [place] PMP: PDMP reviewed during this encounter.       Pharmacotherapy: No side-effects or adverse reactions reported. Compliance: No problems identified. Effectiveness: Clinically acceptable. Plan: Refer to "POC". UDS:  Summary  Date Value Ref Range Status  03/14/2021 Note  Final    Comment:    ==================================================================== Compliance Drug Analysis, Ur ==================================================================== Test                             Result       Flag       Units    NO DRUGS DETECTED. ==================================================================== Test                      Result    Flag   Units      Ref Range   Creatinine              183              mg/dL      >=20 ==================================================================== Declared Medications:  The flagging and interpretation on this report are based on the  following declared medications.  Unexpected results may arise from  inaccuracies in the declared medications.   **Note: The testing scope of this panel does not include the  following reported medications:   Etodolac  Furosemide  Hydrochlorothiazide (Zestoretic)  Lisinopril (Zestoretic)   Pantoprazole ==================================================================== For clinical consultation, please call [phone]. ====================================================================      Laboratory Chemistry Profile   Renal Lab Results  Component Value Date   BUN 12 03/14/2021   CREATININE 0.98 03/14/2021   GFRAA >60 02/03/2017   GFRNONAA >60 03/14/2021    Hepatic Lab Results  Component Value Date   AST 13 (L) 03/14/2021   ALT 13 03/14/2021   ALBUMIN 4.2 03/14/2021   ALKPHOS 95 03/14/2021   LIPASE 30 02/03/2017    Electrolytes Lab Results  Component Value Date   NA 139 03/14/2021   K 3.8 03/14/2021   CL 104 03/14/2021   CALCIUM 9.3 03/14/2021   MG 1.9 03/14/2021    Bone Lab Results  Component Value Date   VD25OH 8.44 (L) 03/14/2021    Inflammation (CRP: Acute Phase) (ESR: Chronic Phase) Lab Results  Component Value Date   CRP 0.6 03/14/2021   ESRSEDRATE 7 03/14/2021         Note: Above Lab results reviewed.  Imaging  DG PAIN CLINIC C-ARM 1-60 MIN NO REPORT Fluoro was used, but no Radiologist interpretation will be provided.  Please refer to "NOTES" tab for provider progress note.  Assessment  The primary encounter diagnosis was Chronic knee pain (Right). Diagnoses of Osteoarthritis of knee (Right), Osteoarthritis  of patellofemoral joint (Right), Secondary osteoarthritis of multiple sites, and Morbid obesity with BMI of 50.0-59.9, adult ([place]) were also pertinent to this visit.  Plan of Care  Problem-specific:  No problem-specific Assessment & Plan notes found for this encounter.  Sharon Holden has a current medication list which includes the following long-term medication(s): furosemide.  Pharmacotherapy (Medications Ordered): No orders of the defined types were placed in this encounter.  Orders:  Orders Placed This Encounter  Procedures   MR KNEE RIGHT WO CONTRAST    Standing Status:   Future    Standing Expiration  Date:   11/20/2021    Scheduling Instructions:     Imaging must be done as soon as possible. Inform patient that order will expire within 30 days and I will not renew it.    Order Specific Question:   What is the patient's sedation requirement?    Answer:   No Sedation    Order Specific Question:   Does the patient have a pacemaker or implanted devices?    Answer:   No    Order Specific Question:   Preferred imaging location?    Answer:   [facility] (table limit-350lbs)    Order Specific Question:   Call Results- Best Contact Number?    Answer:   [phone] ([facility])    Order Specific Question:   Radiology Contrast Protocol - do NOT remove file path    Answer:   [email or web address]   Follow-up plan:   Return for Eval-day (M,W), (VV), for review of ordered tests (right knee MRI).     Interventional Therapies  Risk   Complexity Considerations:   Estimated body mass index is 52.46 kg/m as calculated from the following:   Height as of this encounter: $RemoveBeforeD'5\' 8"'xXmSzIIMpZxtko$  (1.727 m).   Weight as of this encounter: 345 lb (156.5 kg).    WNL   Planned   Pending:   Therapeutic right IA Zilretta knee injection #2    Under consideration:   Diagnostic bilateral lumbar facet MBB #2  Therapeutic right IA Zilretta knee injection #2  Therapeutic right IA Monovisc knee injection #1    Completed:   Diagnostic/therapeutic bilateral lumbar facet MBB x1 (07/24/2021) (100/100/80/80)  Diagnostic/therapeutic right IA Zilretta knee injection x2 (05/22/2021: 100/100/100/100) (10/09/2021: 0/0/0)  Referral to [facility] lifestyle center for medical weight management entered on 05/02/2021.   Therapeutic   Palliative (PRN) options:   Palliative/therapeutic right IA Zilretta knee injections  Therapeutic/palliative bilateral lumbar facet MBB     Recent Visits Date Type Provider Dept  10/09/21 Procedure visit Milinda Pointer, MD [facility]  10/01/21 Office Visit  Milinda Pointer, MD [facility]  08/09/21 Office Visit Milinda Pointer, MD [facility]  Showing recent visits within past 90 days and meeting all other requirements Today's Visits Date Type Provider Dept  10/23/21 Office Visit Milinda Pointer, MD [facility]  Showing today's visits and meeting all other requirements Future Appointments No visits were found meeting these conditions. Showing future appointments within next 90 days and meeting all other requirements  I discussed the assessment and treatment plan with the patient. The patient was provided an opportunity to ask questions and all were answered. The patient agreed with the plan and demonstrated an understanding of the instructions.  Patient advised to call back or seek an in-person evaluation if the symptoms or condition worsens.  Duration of encounter: 16 minutes.  Note by: Gaspar Cola, MD Date: 10/23/2021; Time: 12:20  PM

## 2021-10-23 ENCOUNTER — Other Ambulatory Visit: Payer: Self-pay

## 2021-10-23 ENCOUNTER — Ambulatory Visit: Payer: BC Managed Care – PPO | Attending: Pain Medicine | Admitting: Pain Medicine

## 2021-10-23 DIAGNOSIS — M25561 Pain in right knee: Secondary | ICD-10-CM

## 2021-10-23 DIAGNOSIS — M1711 Unilateral primary osteoarthritis, right knee: Secondary | ICD-10-CM

## 2021-10-23 DIAGNOSIS — Z6841 Body Mass Index (BMI) 40.0 and over, adult: Secondary | ICD-10-CM

## 2021-10-23 DIAGNOSIS — M153 Secondary multiple arthritis: Secondary | ICD-10-CM

## 2021-10-23 DIAGNOSIS — G8929 Other chronic pain: Secondary | ICD-10-CM | POA: Insufficient documentation

## 2021-11-08 ENCOUNTER — Encounter: Payer: Self-pay | Admitting: Dietician

## 2021-11-08 ENCOUNTER — Ambulatory Visit
Admission: RE | Admit: 2021-11-08 | Discharge: 2021-11-08 | Disposition: A | Discharge status: Home / Self Care | Payer: BC Managed Care – PPO | Source: Ambulatory Visit | Attending: Pain Medicine | Admitting: Pain Medicine

## 2021-11-08 DIAGNOSIS — M153 Secondary multiple arthritis: Secondary | ICD-10-CM | POA: Diagnosis present

## 2021-11-08 DIAGNOSIS — M1711 Unilateral primary osteoarthritis, right knee: Secondary | ICD-10-CM

## 2021-11-08 DIAGNOSIS — M25561 Pain in right knee: Secondary | ICD-10-CM | POA: Insufficient documentation

## 2021-11-08 DIAGNOSIS — G8929 Other chronic pain: Secondary | ICD-10-CM | POA: Diagnosis present

## 2021-11-08 DIAGNOSIS — Z6841 Body Mass Index (BMI) 40.0 and over, adult: Secondary | ICD-10-CM | POA: Diagnosis present

## 2021-11-08 IMAGING — MR MR KNEE*R* W/O CM
7 series · 40 of 40 positions shown · non-contrast
Comparison: Radiographs dated [DATE]

CLINICAL DATA: Knee pain, chronic osteoarthritis suspected. Knee
pain for greater than 1 year worsening. No surgery or injury.

EXAM:
MRI OF THE RIGHT KNEE WITHOUT CONTRAST
TECHNIQUE: Multiplanar, multisequence MR imaging of the right knee was
performed. No intravenous contrast was administered.

[Series 18: T2 fat-sat · axial · right · 4.0mm · 0.69mm/px · z∈[-34,+91]mm · 4 of 26 slices shown (1 of 4)]
[im 1/26]
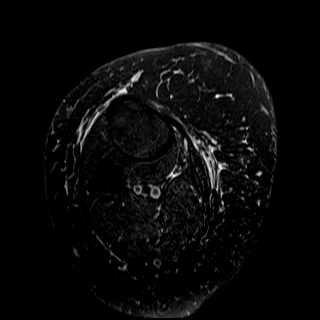
[im 9/26]
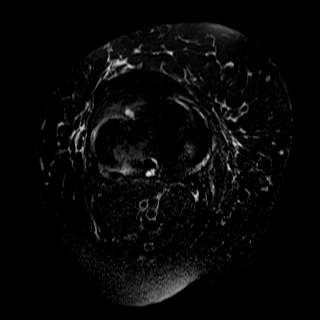
[im 17/26]
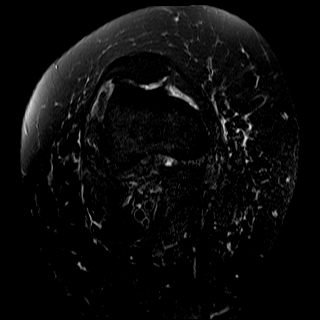
[im 26/26]
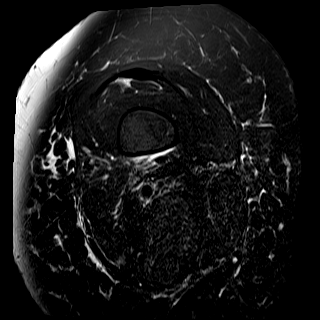

[Series 21: PD fat-sat · coronal · right · 4.0mm · 0.66mm/px · 6 of 36 slices shown (1 of 2)]
[im 1/36]
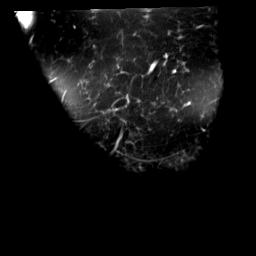
[im 8/36]
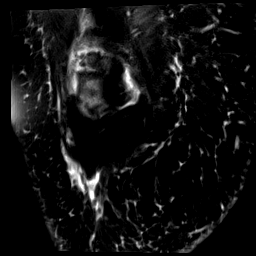
[im 15/36]
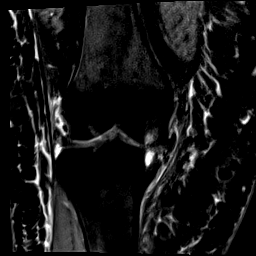
[im 22/36]
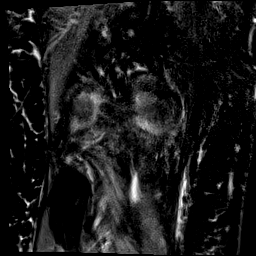
[im 29/36]
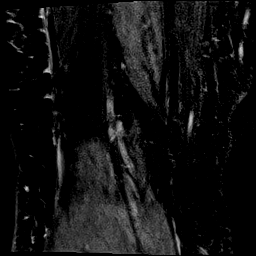
[im 36/36]
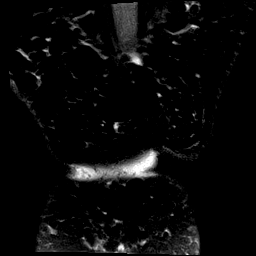

[Series 22: T2 fat-sat · coronal · right · 4.0mm · 0.53mm/px · 6 of 36 slices shown (2 of 4)]
[im 1/36]
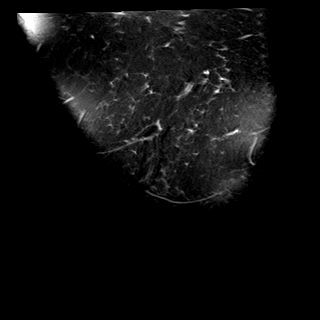
[im 8/36]
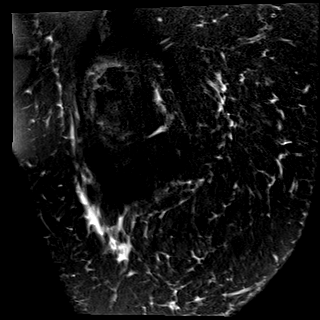
[im 15/36]
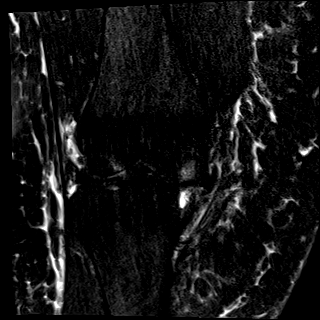
[im 22/36]
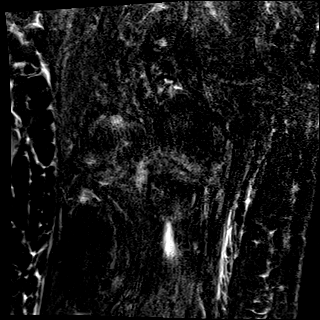
[im 29/36]
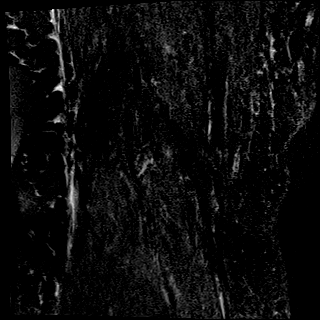
[im 36/36]
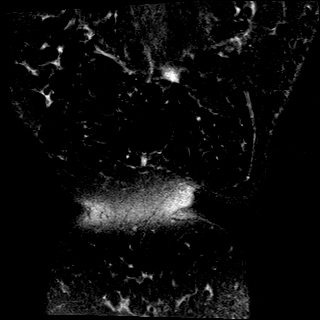

[Series 23: T1 · coronal · right · 4.0mm · 0.53mm/px · 6 of 36 slices shown]
[im 1/36]
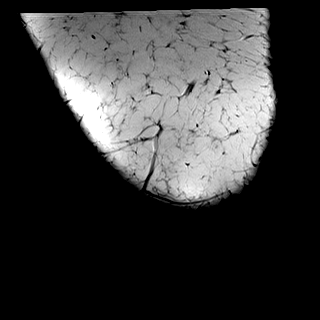
[im 8/36]
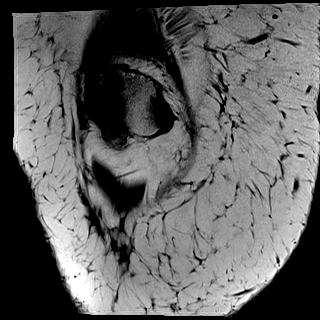
[im 15/36]
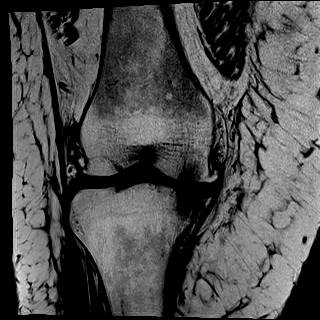
[im 22/36]
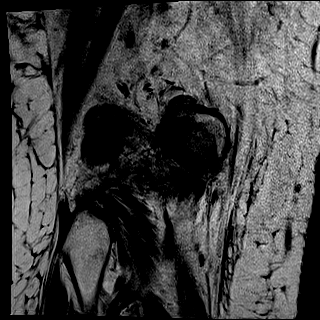
[im 29/36]
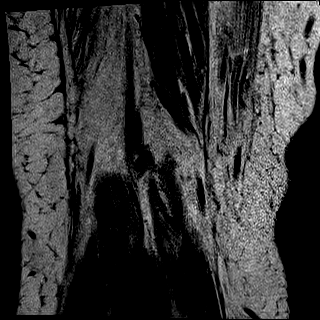
[im 36/36]
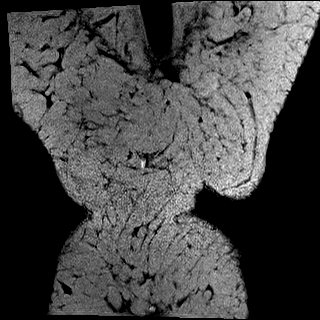

[Series 25: PD fat-sat · sagittal · right · 3.0mm · 0.56mm/px · 6 of 36 slices shown (2 of 2)]
[im 1/36]
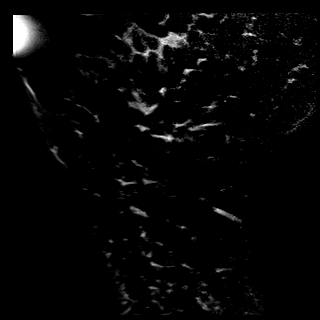
[im 8/36]
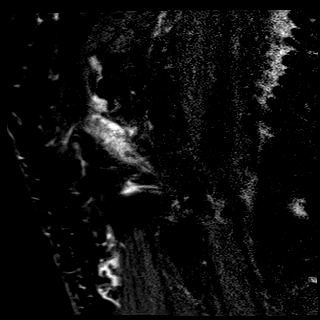
[im 15/36]
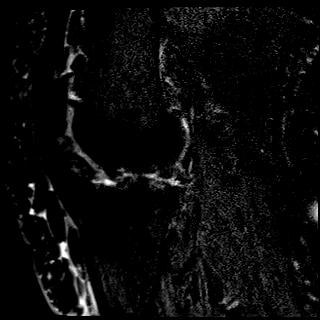
[im 22/36]
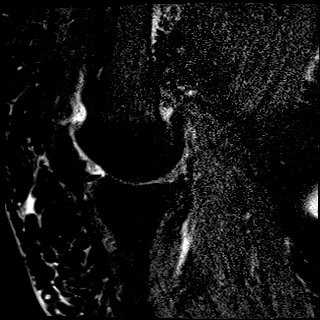
[im 29/36]
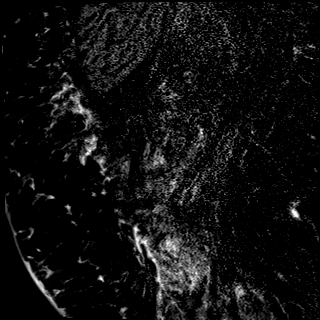
[im 36/36]
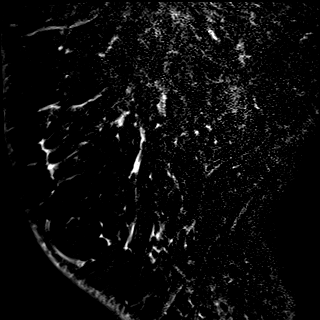

[Series 34: T2 fat-sat · coronal · right · 4.0mm · 0.53mm/px · 6 of 36 slices shown (3 of 4)]
[im 1/36]
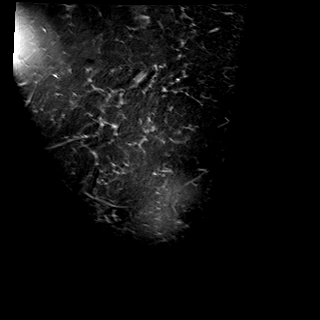
[im 8/36]
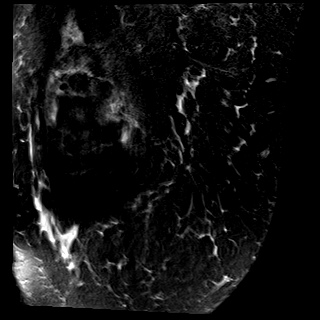
[im 15/36]
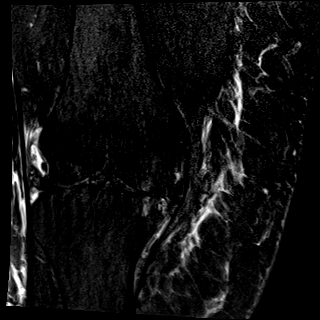
[im 22/36]
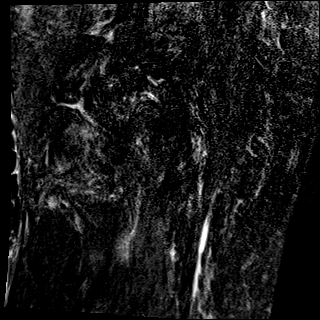
[im 29/36]
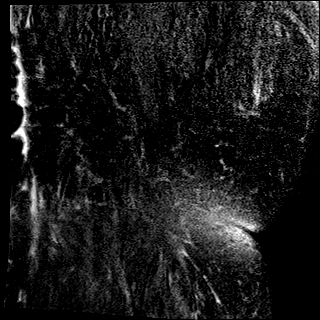
[im 36/36]
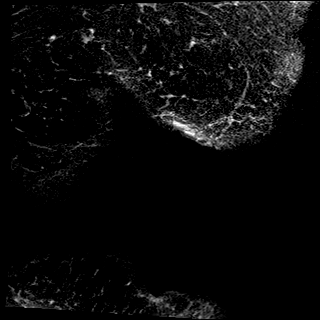

[Series 36: T2 fat-sat · sagittal · right · 3.0mm · 0.56mm/px · 6 of 36 slices shown (4 of 4)]
[im 1/36]
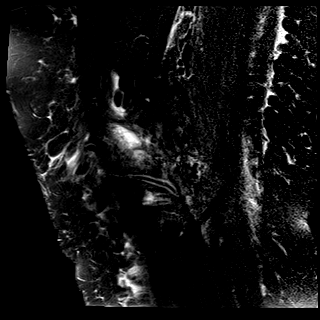
[im 8/36]
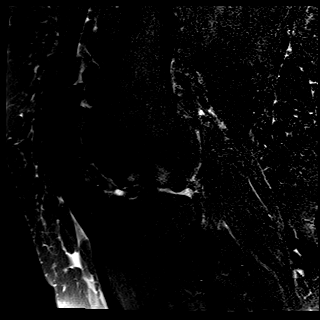
[im 15/36]
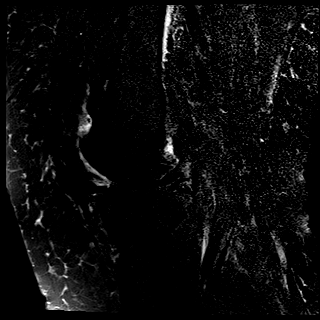
[im 22/36]
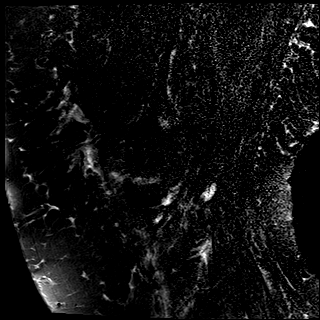
[im 29/36]
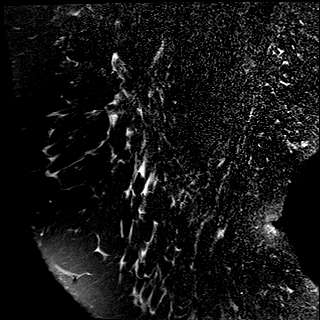
[im 36/36]
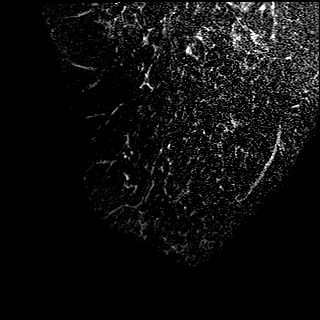

[40 of 40 positions shown; findings below may reference images not displayed]

FINDINGS: Multiple sequences are degraded due to motion.

MENISCI

Medial: Advanced degenerative changes with extrusion of the meniscus
into the medial gutter.

Lateral: Degenerative changes without evidence of tear

LIGAMENTS

Cruciates: PCL is intact. ACL not clearly visualized, which could be
attributed due to motion.

Collaterals: Medial collateral ligament is intact. Lateral
collateral ligament complex is intact.

CARTILAGE

Patellofemoral: Full-thickness cartilage defect of the patellar
apex. Subchondral cystic changes of the trochlea.

Medial: Loss of the articular cartilage with bone-on-bone
articulation and subchondral cystic changes. Prominent marginal
osteophytes.

Lateral: Full-thickness cartilage defect at the weight-bearing area
with subchondral edema of the lateral femoral condyle.

JOINT: Small joint effusion. Normal SUGIMOTO. Plical
thickening.

POPLITEAL FOSSA: Popliteus tendon is intact. No Baker's cyst.

EXTENSOR MECHANISM: Intact quadriceps tendon. Intact patellar
tendon. Intact lateral patellar retinaculum. Intact medial patellar
retinaculum. Intact MPFL.

BONES: No aggressive osseous lesion. No fracture or dislocation.

Other: No fluid collection or hematoma. Muscles appear normal,
evaluation is however limited due to motion.
IMPRESSION: 1. Advanced tricompartmental knee osteoarthritis, which is severe in
the medial tibiofemoral compartment with advanced degenerative
tearing of the medial meniscus, near complete loss of the articular
cartilage with subchondral cystic changes and prominent osteophytes.
Moderate osteoarthritis of the medial tibiofemoral and
patellofemoral compartments.

2.  No evidence of acute fracture or dislocation.

3. Posterior cruciate ligament and collateral ligaments are intact.
Evaluation is ACL is limited due to motion. No MR findings to
suggest full-thickness ACL tear.

4.  Multiple sequences are degraded due to motion.

## 2021-11-08 NOTE — Progress Notes (Signed)
Patient cancelled her appointment on 10/10/21 and has not rescheduled. Sent notification to referring provider. ?

## 2022-01-06 NOTE — Progress Notes (Signed)
PROVIDER NOTE: Information contained herein reflects review and annotations entered in association with encounter. Interpretation of such information and data should be left to medically-trained personnel. Information provided to patient can be located elsewhere in the medical record under "Patient Instructions". Document created using STT-dictation technology, any transcriptional errors that may result from process are unintentional.  ?  ?Patient: Sharon Holden  Service Category: E/M  Provider: Gaspar Cola, MD  ?DOB: November 10, 1960  DOS: 01/09/2022  Specialty: Interventional Pain Management  ?MRN: [id number]  Setting: Ambulatory outpatient  PCP: Marguerita Merles, MD  ?Type: Established Patient    Referring Provider: Marguerita Merles, MD  ?Location: Office  Delivery: Face-to-face    ? ?HPI  ?Sharon Holden, a 61 y.o. year old female, is here today because of her Chronic pain syndrome [G89.4]. Ms. Linker's primary complain today is Back Pain (low) ?Last encounter: My last encounter with her was on 10/23/2021. ?Pertinent problems: [name] has Chronic fatigue; Chronic pain syndrome; Chronic knee pain (3ry area of Pain) (Bilateral) (R>L); DDD (degenerative disc disease), thoracic; DDD (degenerative disc disease), lumbosacral; Chronic lower extremity pain (2ry area of Pain) (Bilateral) (R>L); Chronic low back pain (1ry area of Pain) (Bilateral) (R>L) w/o sciatica; Osteoarthritis of knee (Right); Osteoarthritis of patellofemoral joint (Right); Lumbar facet syndrome (Bilateral); Spondylosis without myelopathy or radiculopathy, lumbosacral region; Secondary osteoarthritis of multiple sites; Chronic knee pain (Right); Tricompartment osteoarthritis of knee (Right); Medial knee pain (Right); and Abnormal MRI, knee (Right) (11/08/2021) on their pertinent problem list. ?Pain Assessment: Severity of Chronic pain is reported as a 4 /10. Location: Back Lower/denies. Onset: 1 to 4 weeks ago. Quality: Sharp. Timing:  Intermittent. Modifying factor(s): PCP gave her Flexeril. ?Vitals:  height is $RemoveB'5\' 8"'FFFRNFrS$  (1.727 m) and weight is 340 lb (154.2 kg) (abnormal). Her temporal temperature is 97.1 ?F (36.2 ?C) (abnormal). Her blood pressure is 156/86 (abnormal) and her pulse is 70. Her respiration is 16 and oxygen saturation is 99%.  ? ?Reason for encounter: follow-up evaluation after right knee MRI.  In addition to going over the results of the MRI and explaining them to the patient in layman's terms, the patient today comes in reporting having had an episode of low back pain in the midline.  She reports that this happened while she was sitting and was not associated with any particular events. The patient refers that the pain was so intense that it caused her to lose work.  She was unable to go to work for approximately 1 week which she spent in bed.  The pain has slowly been getting better.  Today I have provided her with a prescription for a Medrol Dosepak and I have ordered an MRI of the lumbar spine.  Prior x-rays of the lumbar spine indicated progressive degenerative disc disease with intervertebral disc space narrowing and endplate remodeling from L1 to all the way down to L5-S1. ? ?(11/08/2021) RIGHT KNEE MRI IMPRESSION: ?1. Advanced tricompartmental knee osteoarthritis, which is severe in the medial tibiofemoral compartment with advanced degenerative tearing of the medial meniscus, near complete loss of the articular cartilage with subchondral cystic changes and prominent osteophytes. Moderate osteoarthritis of the medial tibiofemoral and patellofemoral compartments. ? ?Today the patient was again reminded of the fact that she needs to work on her weight and try to bring it to a BMI between 30 and 35 kg/m?. ? ?Pharmacotherapy Assessment  ?Analgesic: None ?MME/day: 0 mg/day   ? ?Monitoring: ?[place] PMP: PDMP reviewed during this encounter.       ?  Pharmacotherapy: No side-effects or adverse reactions reported. ?Compliance: No problems  identified. ?Effectiveness: Clinically acceptable. ? ?Hart Rochester, RN  01/09/2022  9:20 AM  Signed ?Safety precautions to be maintained throughout the outpatient stay will include: orient to surroundings, keep bed in low position, maintain call bell within reach at all times, provide assistance with transfer out of bed and ambulation.  ?   UDS:  ?Summary  ?Date Value Ref Range Status  ?03/14/2021 Note  Final  ?  Comment:  ?  ==================================================================== ?Compliance Drug Analysis, Ur ?==================================================================== ?Test                             Result       Flag       Units ? ?  NO DRUGS DETECTED. ?==================================================================== ?Test                      Result    Flag   Units      Ref Range ?  Creatinine              183              mg/dL      >=20 ?==================================================================== ?Declared Medications: ? The flagging and interpretation on this report are based on the ? following declared medications.  Unexpected results may arise from ? inaccuracies in the declared medications. ? ? **Note: The testing scope of this panel does not include the ? following reported medications: ? ? Etodolac ? Furosemide ? Hydrochlorothiazide (Zestoretic) ? Lisinopril (Zestoretic) ? Pantoprazole ?==================================================================== ?For clinical consultation, please call [phone]. ?==================================================================== ?  ?  ? ?ROS  ?Constitutional: Denies any fever or chills ?Gastrointestinal: No reported hemesis, hematochezia, vomiting, or acute GI distress ?Musculoskeletal: Denies any acute onset joint swelling, redness, loss of ROM, or weakness ?Neurological: No reported episodes of acute onset apraxia, aphasia, dysarthria, agnosia, amnesia, paralysis, loss of coordination, or loss of  consciousness ? ?Medication Review  ?cyclobenzaprine, etodolac, furosemide, and methylPREDNISolone ? ?History Review  ?Allergy: Ms. Azad has No Known Allergies. ?Drug: Ms. Petrakis  reports no history of drug use. ?Alcohol:  reports that she does not currently use alcohol. ?Tobacco:  reports that she has never smoked. She has never used smokeless tobacco. ?Social: Ms. Gren  reports that she has never smoked. She has never used smokeless tobacco. She reports that she does not currently use alcohol. She reports that she does not use drugs. ?Medical:  has a past medical history of Arthritis, History of kidney stones, Hypertension, Kidney stones, and Sleep apnea. ?Surgical: Ms. Verdejo  has a past surgical history that includes Gastric bypass; Cesarean section; Ureteroscopy with holmium laser lithotripsy (Left, 06/20/2016); and Cystoscopy with stent placement (Left, 06/20/2016). ?Family: family history is not on file. ? ?Laboratory Chemistry Profile  ? ?Renal ?Lab Results  ?Component Value Date  ? BUN 12 03/14/2021  ? CREATININE 0.98 03/14/2021  ? GFRAA >60 02/03/2017  ? GFRNONAA >60 03/14/2021  ?  Hepatic ?Lab Results  ?Component Value Date  ? AST 13 (L) 03/14/2021  ? ALT 13 03/14/2021  ? ALBUMIN 4.2 03/14/2021  ? ALKPHOS 95 03/14/2021  ? LIPASE 30 02/03/2017  ?  ?Electrolytes ?Lab Results  ?Component Value Date  ? NA 139 03/14/2021  ? K 3.8 03/14/2021  ? CL 104 03/14/2021  ? CALCIUM 9.3 03/14/2021  ? MG 1.9 03/14/2021  ?  Bone ?Lab Results  ?Component  Value Date  ? VD25OH 8.44 (L) 03/14/2021  ?  ?Inflammation (CRP: Acute Phase) (ESR: Chronic Phase) ?Lab Results  ?Component Value Date  ? CRP 0.6 03/14/2021  ? ESRSEDRATE 7 03/14/2021  ?    ?  ? ?Note: Above Lab results reviewed. ? ?Recent Imaging Review  ?MR KNEE RIGHT WO CONTRAST ?CLINICAL DATA:  Knee pain, chronic osteoarthritis suspected. Knee ?pain for greater than 1 year worsening. No surgery or injury. ? ?EXAM: ?MRI OF THE RIGHT KNEE WITHOUT  CONTRAST ? ?TECHNIQUE: ?Multiplanar, multisequence MR imaging of the right knee was ?performed. No intravenous contrast was administered. ? ?COMPARISON:  Radiographs dated May 02, 2021 ? ?FINDINGS: ?Multiple sequences are degraded due to motion. ?

## 2022-01-09 ENCOUNTER — Other Ambulatory Visit: Payer: Self-pay

## 2022-01-09 ENCOUNTER — Ambulatory Visit: Payer: BC Managed Care – PPO | Attending: Pain Medicine | Admitting: Pain Medicine

## 2022-01-09 ENCOUNTER — Encounter: Payer: Self-pay | Admitting: Pain Medicine

## 2022-01-09 VITALS — BP 156/86 | HR 70 | Temp 97.1°F | Resp 16 | Ht 68.0 in | Wt 340.0 lb

## 2022-01-09 DIAGNOSIS — Z6841 Body Mass Index (BMI) 40.0 and over, adult: Secondary | ICD-10-CM | POA: Diagnosis present

## 2022-01-09 DIAGNOSIS — M79604 Pain in right leg: Secondary | ICD-10-CM

## 2022-01-09 DIAGNOSIS — M1711 Unilateral primary osteoarthritis, right knee: Secondary | ICD-10-CM | POA: Diagnosis present

## 2022-01-09 DIAGNOSIS — M25561 Pain in right knee: Secondary | ICD-10-CM | POA: Diagnosis present

## 2022-01-09 DIAGNOSIS — M25562 Pain in left knee: Secondary | ICD-10-CM

## 2022-01-09 DIAGNOSIS — M79605 Pain in left leg: Secondary | ICD-10-CM

## 2022-01-09 DIAGNOSIS — M545 Low back pain, unspecified: Secondary | ICD-10-CM | POA: Diagnosis present

## 2022-01-09 DIAGNOSIS — G8929 Other chronic pain: Secondary | ICD-10-CM

## 2022-01-09 DIAGNOSIS — G894 Chronic pain syndrome: Secondary | ICD-10-CM

## 2022-01-09 DIAGNOSIS — M5137 Other intervertebral disc degeneration, lumbosacral region: Secondary | ICD-10-CM | POA: Diagnosis present

## 2022-01-09 DIAGNOSIS — R936 Abnormal findings on diagnostic imaging of limbs: Secondary | ICD-10-CM

## 2022-01-09 MED ORDER — METHYLPREDNISOLONE 4 MG PO TBPK: ORAL_TABLET | ORAL | 0 refills | Status: AC

## 2022-01-09 NOTE — Progress Notes (Signed)
Safety precautions to be maintained throughout the outpatient stay will include: orient to surroundings, keep bed in low position, maintain call bell within reach at all times, provide assistance with transfer out of bed and ambulation.  

## 2022-01-22 ENCOUNTER — Ambulatory Visit
Admission: RE | Admit: 2022-01-22 | Discharge: 2022-01-22 | Disposition: A | Discharge status: Home / Self Care | Payer: BC Managed Care – PPO | Source: Ambulatory Visit | Attending: Pain Medicine | Admitting: Pain Medicine

## 2022-01-22 DIAGNOSIS — G8929 Other chronic pain: Secondary | ICD-10-CM | POA: Diagnosis present

## 2022-01-22 DIAGNOSIS — M545 Low back pain, unspecified: Secondary | ICD-10-CM | POA: Insufficient documentation

## 2022-01-22 DIAGNOSIS — M79604 Pain in right leg: Secondary | ICD-10-CM | POA: Diagnosis present

## 2022-01-22 DIAGNOSIS — M79605 Pain in left leg: Secondary | ICD-10-CM | POA: Diagnosis present

## 2022-01-22 DIAGNOSIS — M5137 Other intervertebral disc degeneration, lumbosacral region: Secondary | ICD-10-CM | POA: Insufficient documentation

## 2022-01-22 IMAGING — MR MR LUMBAR SPINE W/O CM
5 of 6 series · 31 of 48 positions shown · non-contrast
Comparison: No prior MRI, correlation is made with lumbar spine
radiographs [DATE]

CLINICAL DATA: Low back pain

EXAM:
MRI LUMBAR SPINE WITHOUT CONTRAST
TECHNIQUE: Multiplanar, multisequence MR imaging of the lumbar spine was
performed. No intravenous contrast was administered.

[Series 9: T2 · sagittal · 4.0mm · 0.81mm/px · 6 of 17 slices shown (1 of 2)]
[im 1/17]
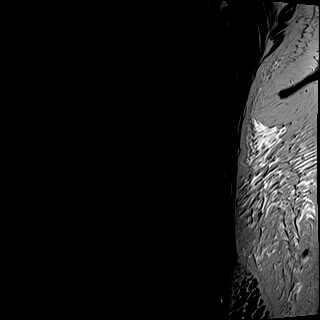
[im 4/17]
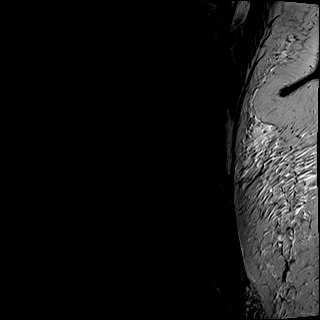
[im 7/17]
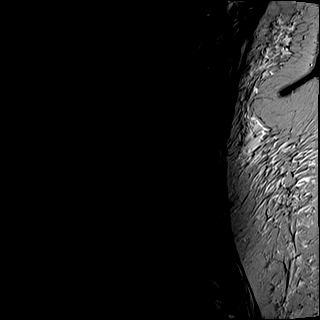
[im 10/17]
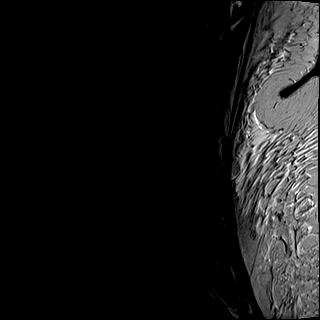
[im 13/17]
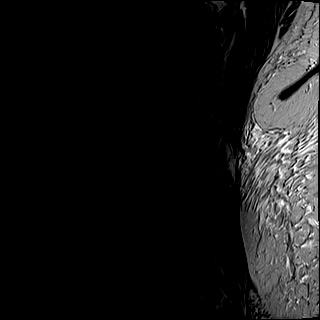
[im 17/17]
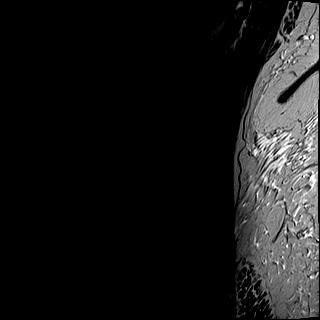

[Series 10: T1 · sagittal · 4.0mm · 0.81mm/px · 6 of 17 slices shown (1 of 2)]
[im 1/17]
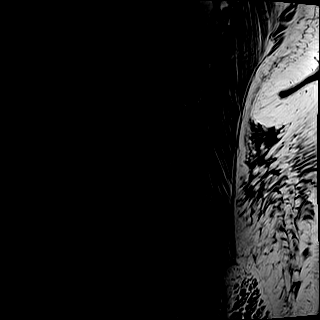
[im 4/17]
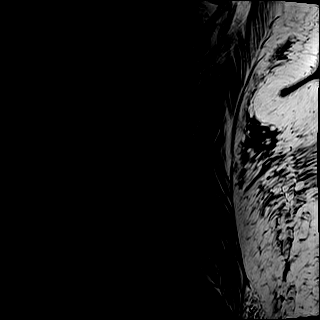
[im 7/17]
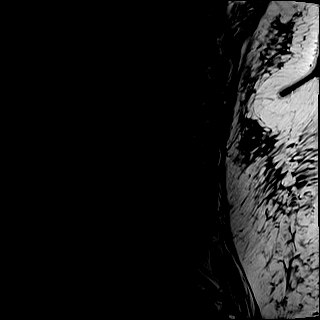
[im 10/17]
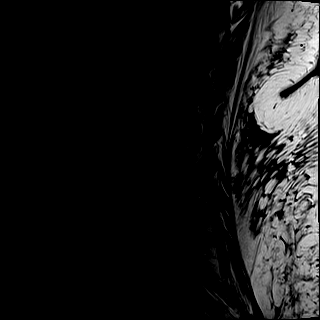
[im 13/17]
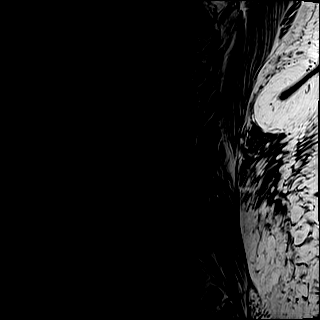
[im 17/17]
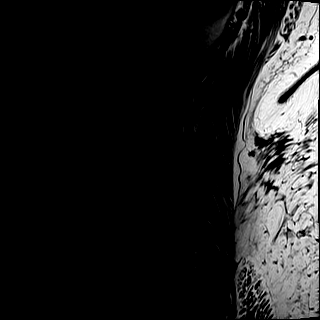

[Series 11: STIR · sagittal · 4.0mm · 0.41mm/px · 1 of 17 slices shown]
[im 1/17]
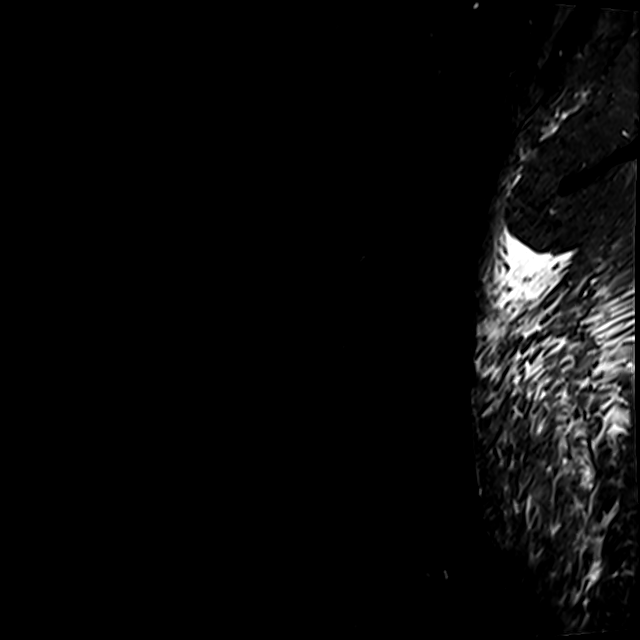

[Series 12: T2 · axial · 4.0mm · 0.78mm/px · z∈[-74,+137]mm · 9 of 36 slices shown (2 of 2)]
[im 1/36]
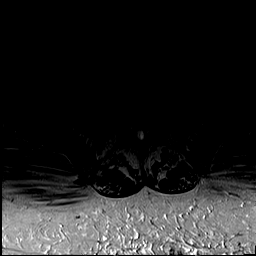
[im 7/36]
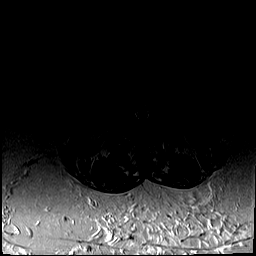
[im 10/36]
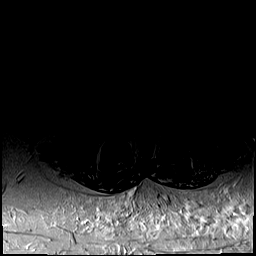
[im 16/36]
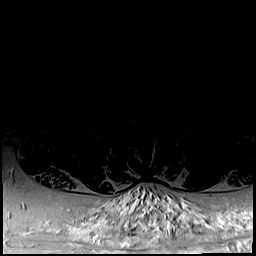
[im 20/36]
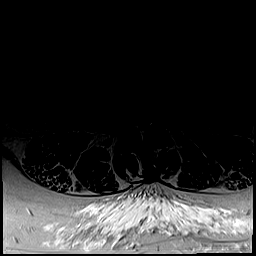
[im 26/36]
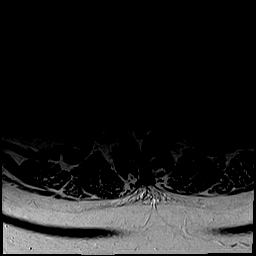
[im 29/36]
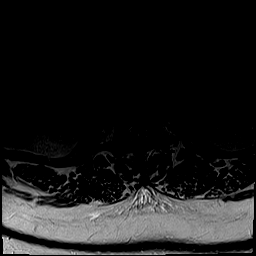
[im 32/36]
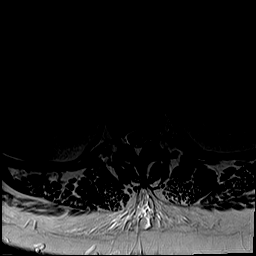
[im 36/36]
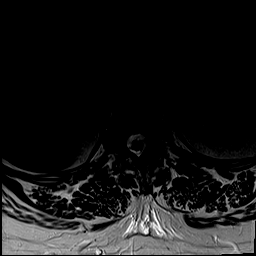

[Series 13: T1 · axial · 4.0mm · 0.39mm/px · z∈[-74,+137]mm · 9 of 36 slices shown (2 of 2)]
[im 1/36]
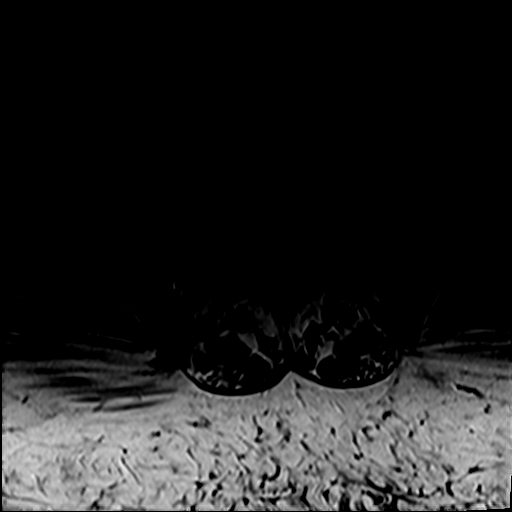
[im 7/36]
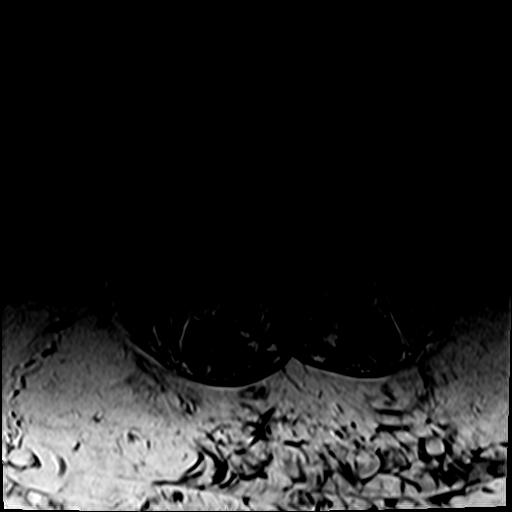
[im 10/36]
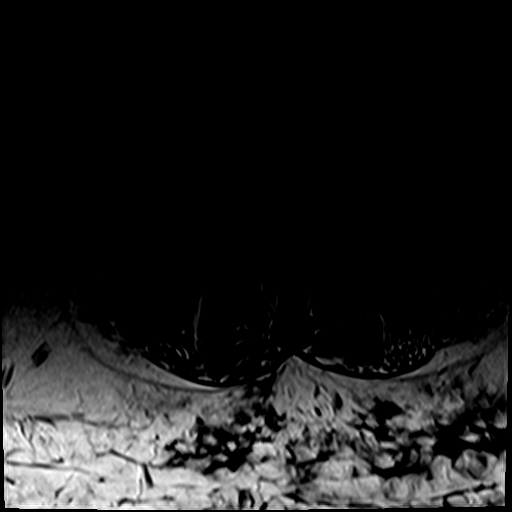
[im 16/36]
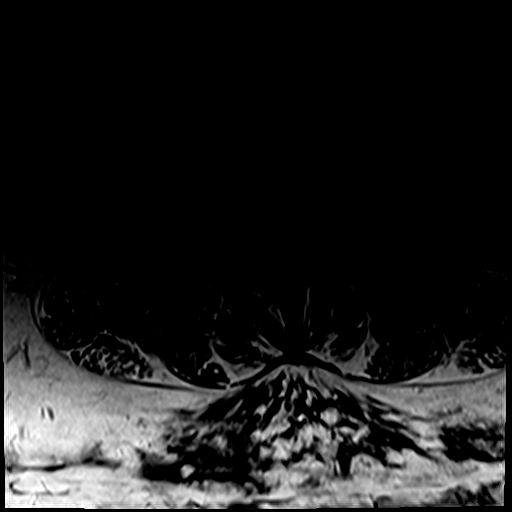
[im 20/36]
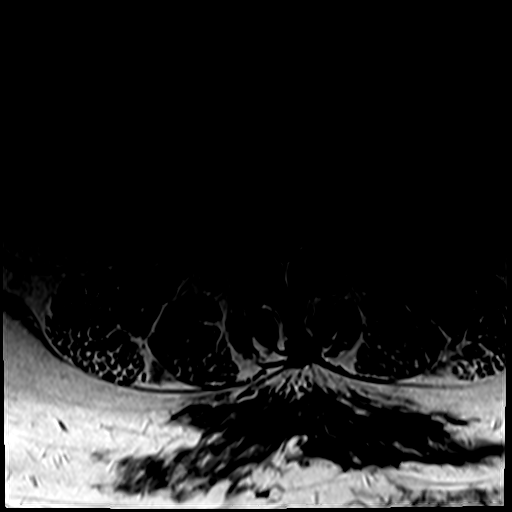
[im 26/36]
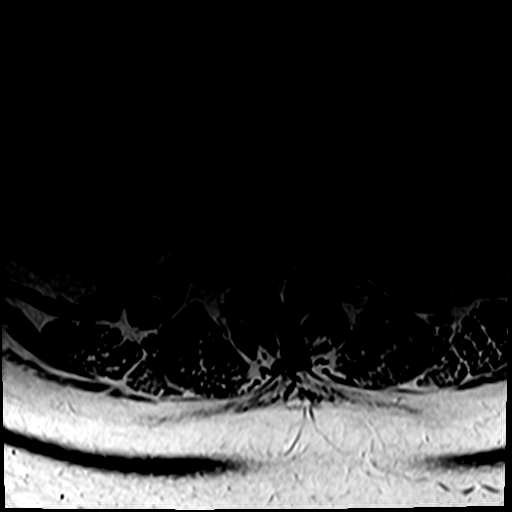
[im 29/36]
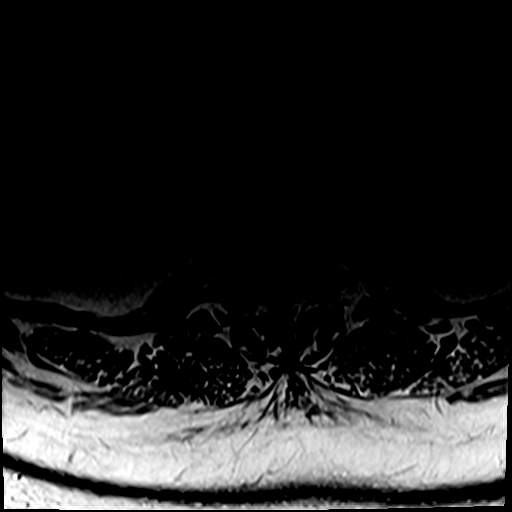
[im 32/36]
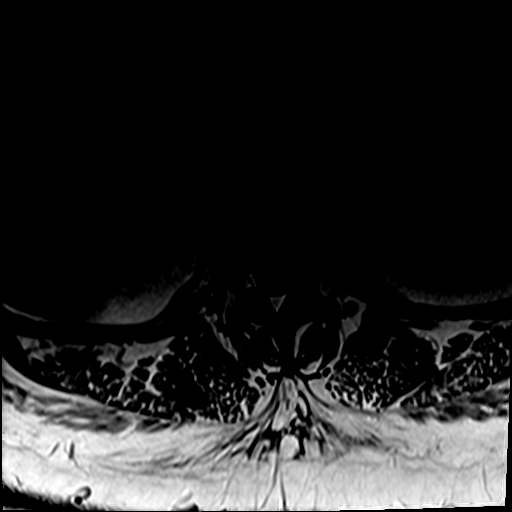
[im 36/36]
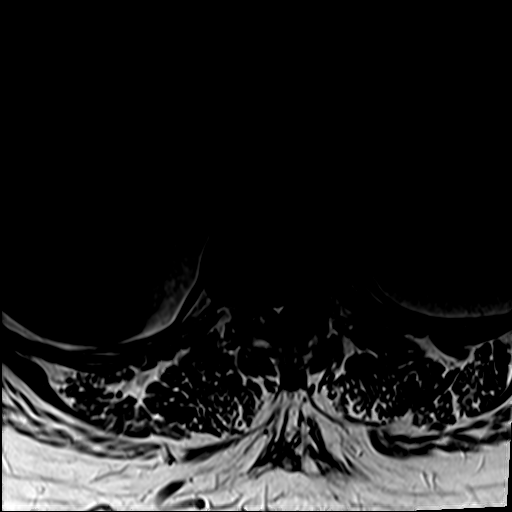

[31 of 48 positions shown; findings below may reference images not displayed]

FINDINGS: Segmentation:  Standard.

Alignment:  Minimal dextrocurvature.  Trace retrolisthesis L5 on S1.

Vertebrae: No acute fracture or suspicious osseous lesion.
Congenitally short pedicles, which narrow the AP diameter of the
spinal canal.

Conus medullaris and cauda equina: Conus extends to the L1 level.
Conus and cauda equina appear normal.

Paraspinal and other soft tissues: Small renal cysts.

Disc levels:

T12-L1: No significant disc bulge. No spinal canal stenosis or
neural foraminal narrowing.

L1-L2: Mild disc bulge. Mild facet arthropathy. Epidural
lipomatosis, which causes moderate thecal sac narrowing. Mild
osseous stenosis. No neural foraminal narrowing.

L2-L3: Minimal disc bulge. Epidural lipomatosis, which causes
moderate thecal sac narrowing. Mild facet arthropathy. Mild osseous
spinal canal stenosis. No neural foraminal narrowing.

L3-L4: Minimal disc bulge. Mild-to-moderate facet arthropathy. Mild
thecal sac narrowing, in part secondary to epidural lipomatosis. No
neural foraminal narrowing.

L4-L5: Mild disc bulge. Moderate facet arthropathy. Mild-to-moderate
spinal canal stenosis. Mild bilateral neural foraminal narrowing.

L5-S1: Mild disc bulge and trace retrolisthesis. Mild facet
arthropathy. Ligamentum flavum hypertrophy. Mild spinal canal
stenosis. Mild bilateral neural foraminal narrowing.
IMPRESSION: 1. L4-L5 mild-to-moderate spinal canal stenosis and mild bilateral
neural foraminal narrowing.
2. L5-S1 mild spinal canal stenosis and mild bilateral neural
foraminal narrowing.
3. Moderate thecal sac narrowing at L1-L2 and L2-L3, primarily
secondary to epidural lipomatosis, with milder osseous spinal canal
stenosis at these levels. Additional mild thecal sac narrowing is
also noted at L3-L4, in part secondary to epidural lipomatosis.

## 2022-02-18 NOTE — Progress Notes (Unsigned)
PROVIDER NOTE: Information contained herein reflects review and annotations entered in association with encounter. Interpretation of such information and data should be left to medically-trained personnel. Information provided to patient can be located elsewhere in the medical record under "Patient Instructions". Document created using STT-dictation technology, any transcriptional errors that may result from process are unintentional.    Patient: Sharon Holden  Service Category: E/M  Provider: Gaspar Cola, MD  DOB: 1960/12/12  DOS: 02/20/2022  Specialty: Interventional Pain Management  MRN: [id number]  Setting: Ambulatory outpatient  PCP: Marguerita Merles, MD  Type: Established Patient    Referring Provider: Marguerita Merles, MD  Location: Office  Delivery: Face-to-face     HPI  Sharon Holden, a 61 y.o. year old female, is here today because of her No primary diagnosis found.. Sharon Holden's primary complain today is No chief complaint on file. Last encounter: My last encounter with her was on 01/09/2022. Pertinent problems: Sharon Holden has Chronic fatigue; Chronic pain syndrome; Chronic knee pain (3ry area of Pain) (Bilateral) (R>L); DDD (degenerative disc disease), thoracic; DDD (degenerative disc disease), lumbosacral; Chronic lower extremity pain (2ry area of Pain) (Bilateral) (R>L); Chronic low back pain (1ry area of Pain) (Bilateral) (R>L) w/o sciatica; Osteoarthritis of knee (Right); Osteoarthritis of patellofemoral joint (Right); Lumbar facet syndrome (Bilateral); Spondylosis without myelopathy or radiculopathy, lumbosacral region; Secondary osteoarthritis of multiple sites; Chronic knee pain (Right); Tricompartment osteoarthritis of knee (Right); Medial knee pain (Right); and Abnormal MRI, knee (Right) (11/08/2021) on their pertinent problem list. Pain Assessment: Severity of   is reported as a  /10. Location:    / . Onset:  . Quality:  . Timing:  . Modifying factor(s):  Marland Kitchen Vitals:  vitals  were not taken for this visit.   Reason for encounter:  *** . ***  Pharmacotherapy Assessment  Analgesic: None MME/day: 0 mg/day    Monitoring: [place] PMP: PDMP reviewed during this encounter.       Pharmacotherapy: No side-effects or adverse reactions reported. Compliance: No problems identified. Effectiveness: Clinically acceptable.  No notes on file  UDS:  Summary  Date Value Ref Range Status  03/14/2021 Note  Final    Comment:    ==================================================================== Compliance Drug Analysis, Ur ==================================================================== Test                             Result       Flag       Units    NO DRUGS DETECTED. ==================================================================== Test                      Result    Flag   Units      Ref Range   Creatinine              183              mg/dL      >=20 ==================================================================== Declared Medications:  The flagging and interpretation on this report are based on the  following declared medications.  Unexpected results may arise from  inaccuracies in the declared medications.   **Note: The testing scope of this panel does not include the  following reported medications:   Etodolac  Furosemide  Hydrochlorothiazide (Zestoretic)  Lisinopril (Zestoretic)  Pantoprazole ==================================================================== For clinical consultation, please call [phone]. ====================================================================      ROS  Constitutional: Denies any fever or chills Gastrointestinal: No reported hemesis, hematochezia, vomiting,  or acute GI distress Musculoskeletal: Denies any acute onset joint swelling, redness, loss of ROM, or weakness Neurological: No reported episodes of acute onset apraxia, aphasia, dysarthria, agnosia, amnesia, paralysis, loss of coordination, or loss  of consciousness  Medication Review  cyclobenzaprine, etodolac, and furosemide  History Review  Allergy: Sharon Holden has No Known Allergies. Drug: Sharon Holden  reports no history of drug use. Alcohol:  reports that she does not currently use alcohol. Tobacco:  reports that she has never smoked. She has never used smokeless tobacco. Social: Sharon Holden  reports that she has never smoked. She has never used smokeless tobacco. She reports that she does not currently use alcohol. She reports that she does not use drugs. Medical:  has a past medical history of Arthritis, History of kidney stones, Hypertension, Kidney stones, and Sleep apnea. Surgical: Sharon Holden  has a past surgical history that includes Gastric bypass; Cesarean section; Ureteroscopy with holmium laser lithotripsy (Left, 06/20/2016); and Cystoscopy with stent placement (Left, 06/20/2016). Family: family history is not on file.  Laboratory Chemistry Profile   Renal Lab Results  Component Value Date   BUN 12 03/14/2021   CREATININE 0.98 03/14/2021   GFRAA >60 02/03/2017   GFRNONAA >60 03/14/2021    Hepatic Lab Results  Component Value Date   AST 13 (L) 03/14/2021   ALT 13 03/14/2021   ALBUMIN 4.2 03/14/2021   ALKPHOS 95 03/14/2021   LIPASE 30 02/03/2017    Electrolytes Lab Results  Component Value Date   NA 139 03/14/2021   K 3.8 03/14/2021   CL 104 03/14/2021   CALCIUM 9.3 03/14/2021   MG 1.9 03/14/2021    Bone Lab Results  Component Value Date   VD25OH 8.44 (L) 03/14/2021    Inflammation (CRP: Acute Phase) (ESR: Chronic Phase) Lab Results  Component Value Date   CRP 0.6 03/14/2021   ESRSEDRATE 7 03/14/2021         Note: Above Lab results reviewed.  Recent Imaging Review  MR LUMBAR SPINE WO CONTRAST CLINICAL DATA:  Low back pain  EXAM: MRI LUMBAR SPINE WITHOUT CONTRAST  TECHNIQUE: Multiplanar, multisequence MR imaging of the lumbar spine was performed. No intravenous contrast was  administered.  COMPARISON:  No prior MRI, correlation is made with lumbar spine radiographs 05/02/2021  FINDINGS: Segmentation:  Standard.  Alignment:  Minimal dextrocurvature.  Trace retrolisthesis L5 on S1.  Vertebrae: No acute fracture or suspicious osseous lesion. Congenitally short pedicles, which narrow the AP diameter of the spinal canal.  Conus medullaris and cauda equina: Conus extends to the L1 level. Conus and cauda equina appear normal.  Paraspinal and other soft tissues: Small renal cysts.  Disc levels:  T12-L1: No significant disc bulge. No spinal canal stenosis or neural foraminal narrowing.  L1-L2: Mild disc bulge. Mild facet arthropathy. Epidural lipomatosis, which causes moderate thecal sac narrowing. Mild osseous stenosis. No neural foraminal narrowing.  L2-L3: Minimal disc bulge. Epidural lipomatosis, which causes moderate thecal sac narrowing. Mild facet arthropathy. Mild osseous spinal canal stenosis. No neural foraminal narrowing.  L3-L4: Minimal disc bulge. Mild-to-moderate facet arthropathy. Mild thecal sac narrowing, in part secondary to epidural lipomatosis. No neural foraminal narrowing.  L4-L5: Mild disc bulge. Moderate facet arthropathy. Mild-to-moderate spinal canal stenosis. Mild bilateral neural foraminal narrowing.  L5-S1: Mild disc bulge and trace retrolisthesis. Mild facet arthropathy. Ligamentum flavum hypertrophy. Mild spinal canal stenosis. Mild bilateral neural foraminal narrowing.  IMPRESSION: 1. L4-L5 mild-to-moderate spinal canal stenosis and mild bilateral neural foraminal narrowing. 2. L5-S1  mild spinal canal stenosis and mild bilateral neural foraminal narrowing. 3. Moderate thecal sac narrowing at L1-L2 and L2-L3, primarily secondary to epidural lipomatosis, with milder osseous spinal canal stenosis at these levels. Additional mild thecal sac narrowing is also noted at L3-L4, in part secondary to epidural  lipomatosis.  Electronically Signed   By: Merilyn Baba M.D.   On: 01/23/2022 04:09 Note: Reviewed        Physical Exam  General appearance: Well nourished, well developed, and well hydrated. In no apparent acute distress Mental status: Alert, oriented x 3 (person, place, & time)       Respiratory: No evidence of acute respiratory distress Eyes: PERLA Vitals: LMP  (LMP Unknown) Comment: neg preg test BMI: Estimated body mass index is 51.7 kg/m as calculated from the following:   Height as of 01/09/22: $RemoveBef'5\' 8"'UtqmcmEIeH$  (1.727 m).   Weight as of 01/09/22: 340 lb (154.2 kg). Ideal: Patient weight not recorded  Assessment   Diagnosis Status  No diagnosis found. Controlled Controlled Controlled   Updated Problems: No problems updated.  Plan of Care  Problem-specific:  No problem-specific Assessment & Plan notes found for this encounter.  Sharon Holden has a current medication list which includes the following long-term medication(s): furosemide.  Pharmacotherapy (Medications Ordered): No orders of the defined types were placed in this encounter.  Orders:  No orders of the defined types were placed in this encounter.  Follow-up plan:   No follow-ups on file.     Interventional Therapies  Risk  Complexity Considerations:   Estimated body mass index is 52.46 kg/m as calculated from the following:   Height as of this encounter: $RemoveBeforeD'5\' 8"'qLZZreNzNBZNck$  (1.727 m).   Weight as of this encounter: 345 lb (156.5 kg).    WNL   Planned  Pending:   Therapeutic right IA Zilretta knee injection #2    Under consideration:   Diagnostic bilateral lumbar facet MBB #2  Therapeutic right IA Zilretta knee injection #2  Therapeutic right IA Monovisc knee injection #1    Completed:   Diagnostic/therapeutic bilateral lumbar facet MBB x1 (07/24/2021) (100/100/80/80)  Diagnostic/therapeutic right IA Zilretta knee injection x2 (05/22/2021: 100/100/100/100) (10/09/2021: 0/0/0)  Referral to [facility] lifestyle  center for medical weight management entered on 05/02/2021. Referral to orthopedic surgery for right knee replacement    Therapeutic  Palliative (PRN) options:   Palliative/therapeutic right IA Zilretta knee injections  Therapeutic/palliative bilateral lumbar facet MBB      Recent Visits Date Type Provider Dept  01/09/22 Office Visit Milinda Pointer, MD [facility]  Showing recent visits within past 90 days and meeting all other requirements Future Appointments Date Type Provider Dept  02/20/22 Appointment Milinda Pointer, MD [facility]  Showing future appointments within next 90 days and meeting all other requirements  I discussed the assessment and treatment plan with the patient. The patient was provided an opportunity to ask questions and all were answered. The patient agreed with the plan and demonstrated an understanding of the instructions.  Patient advised to call back or seek an in-person evaluation if the symptoms or condition worsens.  Duration of encounter: *** minutes.  Note by: Gaspar Cola, MD Date: 02/20/2022; Time: 1:58 PM

## 2022-02-20 ENCOUNTER — Ambulatory Visit (HOSPITAL_BASED_OUTPATIENT_CLINIC_OR_DEPARTMENT_OTHER): Payer: BC Managed Care – PPO | Admitting: Pain Medicine

## 2022-02-20 DIAGNOSIS — Z91199 Patient's noncompliance with other medical treatment and regimen due to unspecified reason: Secondary | ICD-10-CM

## 2022-02-20 DIAGNOSIS — Z8639 Personal history of other endocrine, nutritional and metabolic disease: Secondary | ICD-10-CM | POA: Insufficient documentation

## 2022-02-20 DIAGNOSIS — D1779 Benign lipomatous neoplasm of other sites: Secondary | ICD-10-CM | POA: Insufficient documentation

## 2022-02-20 DIAGNOSIS — R937 Abnormal findings on diagnostic imaging of other parts of musculoskeletal system: Secondary | ICD-10-CM | POA: Insufficient documentation

## 2022-02-20 DIAGNOSIS — R739 Hyperglycemia, unspecified: Secondary | ICD-10-CM | POA: Insufficient documentation

## 2022-02-20 NOTE — Patient Instructions (Signed)
______________________________________________________________________________________________  Body mass index (BMI)  Body mass index (BMI) is a common tool for deciding whether a person has an appropriate body weight.  It measures a persons weight in relation to their height.   According to the [facility] of health (NIH): A BMI of less than 18.5 means that a person is underweight. A BMI of between 18.5 and 24.9 is ideal. A BMI of between 25 and 29.9 is overweight. A BMI over 30 indicates obesity.  Weight Management Required  URGENT: Your weight has been found to be adversely affecting your health.  Dear Sharon Holden:  Your current Estimated body mass index is 51.7 kg/m as calculated from the following:   Height as of 01/09/22: $RemoveBef'5\' 8"'nZhUNplVYj$  (1.727 m).   Weight as of 01/09/22: 340 lb (154.2 kg).  Please use the table below to identify your weight category and associated incidence of chronic pain, secondary to your weight.  Body Mass Index (BMI) Classification BMI level (kg/m2) Category Associated incidence of chronic pain  <18  Underweight   18.5-24.9 Ideal body weight   25-29.9 Overweight  20%  30-34.9 Obese (Class I)  68%  35-39.9 Severe obesity (Class II)  136%  >40 Extreme obesity (Class III)  254%   In addition: You will be considered "Morbidly Obese", if your BMI is above 30 and you have one or more of the following conditions which are known to be caused and/or directly associated with obesity: 1.    Type 2 Diabetes (Which in turn can lead to cardiovascular diseases (CVD), stroke, peripheral vascular diseases (PVD), retinopathy, nephropathy, and neuropathy) 2.    Cardiovascular Disease (High Blood Pressure; Congestive Heart Failure; High Cholesterol; Coronary Artery Disease; Angina; or History of Heart Attacks) 3.    Breathing problems (Asthma; obesity-hypoventilation syndrome; obstructive sleep apnea; chronic inflammatory airway disease; reactive airway disease; or  shortness of breath) 4.    Chronic kidney disease 5.    Liver disease (nonalcoholic fatty liver disease) 6.    High blood pressure 7.    Acid reflux (gastroesophageal reflux disease; heartburn) 8.    Osteoarthritis (OA) (with any of the following: hip pain; knee pain; and/or low back pain) 9.    Low back pain (Lumbar Facet Syndrome; and/or Degenerative Disc Disease) 10.  Hip pain (Osteoarthritis of hip) (For every 1 lbs of added body weight, there is a 2 lbs increase in pressure inside of each hip articulation. 1:2 mechanical relationship) 11.  Knee pain (Osteoarthritis of knee) (For every 1 lbs of added body weight, there is a 4 lbs increase in pressure inside of each knee articulation. 1:4 mechanical relationship) (patients with a BMI>30 kg/m2 were 6.8 times more likely to develop knee OA than normal-weight individuals) 12.  Cancer: Epidemiological studies have shown that obesity is a risk factor for: post-menopausal breast cancer; cancers of the endometrium, colon and kidney cancer; malignant adenomas of the oesophagus. Obese subjects have an approximately 1.5-3.5-fold increased risk of developing these cancers compared with normal-weight subjects, and it has been estimated that between 15 and 45% of these cancers can be attributed to overweight. More recent studies suggest that obesity may also increase the risk of other types of cancer, including pancreatic, hepatic and gallbladder cancer. (Ref: Obesity and cancer. Pischon T, Nthlings U, [employer] H. Proc Nutr Soc. 2008 May;67(2):128-45. doi: 30.8657/Q4696295284132440.) The International Agency for Research on Cancer (IARC) has identified 13 cancers associated with overweight and obesity: meningioma, multiple myeloma, adenocarcinoma of the esophagus, and cancers of the thyroid,  postmenopausal breast cancer, gallbladder, stomach, liver, pancreas, kidney, ovaries, uterus, colon and rectal (colorectal) cancers. 55 percent of all cancers diagnosed in women  and 24 percent of those diagnosed in men are associated with overweight and obesity.  Recommendation: At this point it is urgent that you take a step back and concentrate in loosing weight. Dedicate 100% of your efforts on this task. Nothing else will improve your health more than bringing your weight down and your BMI to less than 30. If you are here, you probably have chronic pain. We know that most chronic pain patients have difficulty exercising secondary to their pain. For this reason, you must rely on proper nutrition and diet in order to lose the weight. If your BMI is above 40, you should seriously consider bariatric surgery. A realistic goal is to lose 10% of your body weight over a period of 12 months.  Be honest to yourself, if over time you have unsuccessfully tried to lose weight, then it is time for you to seek professional help and to enter a medically supervised weight management program, and/or undergo bariatric surgery. Stop procrastinating.   Pain management considerations:  1.    Pharmacological Problems: Be advised that the use of opioid analgesics (oxycodone; hydrocodone; morphine; methadone; codeine; and all of their derivatives) have been associated with decreased metabolism and weight gain.  For this reason, should we see that you are unable to lose weight while taking these medications, it may become necessary for us to taper down and indefinitely discontinue them.  2.    Technical Problems: The incidence of successful interventional therapies decreases as the patient's BMI increases. It is much more difficult to accomplish a safe and effective interventional therapy on a patient with a BMI above 35. 3.    Radiation Exposure Problems: The x-rays machine, used to accomplish injection therapies, will automatically increase their x-ray output in order to capture an appropriate bone image. This means that radiation exposure increases exponentially with the patient's BMI. (The higher the  BMI, the higher the radiation exposure.) Although the level of radiation used at a given time is still safe to the patient, it is not for the physician and/or assisting staff. Unfortunately, radiation exposure is accumulative. Because physicians and the staff have to do procedures and be exposed on a daily basis, this can result in health problems such as cancer and radiation burns. Radiation exposure to the staff is monitored by the radiation batches that they wear. The exposure levels are reported back to the staff on a quarterly basis. Depending on levels of exposure, physicians and staff may be obligated by law to decrease this exposure. This means that they have the right and obligation to refuse providing therapies where they may be overexposed to radiation. For this reason, physicians may decline to offer therapies such as radiofrequency ablation or implants to patients with a BMI above 40. 4.    Current Trends: Be advised that the current trend is to no longer offer certain therapies to patients with a BMI equal to, or above 35, due to increase perioperative risks, increased technical procedural difficulties, and excessive radiation exposure to healthcare personnel.  ______________________________________________________________________________________________    

## 2022-05-15 ENCOUNTER — Other Ambulatory Visit: Payer: Self-pay | Admitting: Family Medicine

## 2022-05-15 DIAGNOSIS — M79605 Pain in left leg: Secondary | ICD-10-CM

## 2022-05-22 ENCOUNTER — Ambulatory Visit: Payer: BC Managed Care – PPO | Attending: Family Medicine

## 2022-08-01 ENCOUNTER — Other Ambulatory Visit (HOSPITAL_COMMUNITY)
Admission: RE | Admit: 2022-08-01 | Discharge: 2022-08-01 | Disposition: A | Discharge status: Home / Self Care | Payer: BC Managed Care – PPO | Source: Ambulatory Visit | Attending: Obstetrics and Gynecology | Admitting: Obstetrics and Gynecology

## 2022-08-01 ENCOUNTER — Encounter: Payer: Self-pay | Admitting: Obstetrics and Gynecology

## 2022-08-01 ENCOUNTER — Ambulatory Visit: Payer: BC Managed Care – PPO | Admitting: Obstetrics and Gynecology

## 2022-08-01 VITALS — Ht 67.0 in | Wt 347.0 lb

## 2022-08-01 DIAGNOSIS — Z1151 Encounter for screening for human papillomavirus (HPV): Secondary | ICD-10-CM | POA: Insufficient documentation

## 2022-08-01 DIAGNOSIS — Z124 Encounter for screening for malignant neoplasm of cervix: Secondary | ICD-10-CM | POA: Diagnosis present

## 2022-08-01 DIAGNOSIS — N950 Postmenopausal bleeding: Secondary | ICD-10-CM | POA: Insufficient documentation

## 2022-08-01 NOTE — Progress Notes (Signed)
Sharon Merles, MD   Chief Complaint  Patient presents with   Vaginal Bleeding    On/off x 1 yr, no abnormal pain    HPI:      Sharon Holden is a 61 y.o. [id number] whose LMP was No LMP recorded (lmp unknown). Patient is postmenopausal., presents today for NP eval of PMB, referred by PCP. Pt's LMP about age 31. Had PMB 2016 with neg pathology on hyst/D&C with Dr. Ilda Basset. Notes not in Epic. No PMB until the past year or so, with intermittent (~Q3 months per pt) red spotting with wiping, lasting 1-2 days, no pain. Flow was heavier when it occurred in Sept, lasting 3-4 days, needed to wear light pad and had mild pelvic discomfort. Pt has not had a recent pap smear, no hx of abn paps with tx. Hx of HTN/obesity.  Pt is not sexually active, no vag or urin sx. No FH breast/ovar/uterine/colon cancer.    Patient Active Problem List   Diagnosis Date Noted   Abnormal MRI, lumbar spine (01/23/2022) 02/20/2022   Epidural lipomatosis 02/20/2022   History of elevated glucose 02/20/2022   Elevated serum glucose 02/20/2022   Tricompartment osteoarthritis of knee (Right) 01/09/2022   Medial knee pain (Right) 01/09/2022   Abnormal MRI, knee (Right) (11/08/2021) 01/09/2022   Chronic knee pain (Right) 10/23/2021   Spondylosis without myelopathy or radiculopathy, lumbosacral region 07/24/2021   Secondary osteoarthritis of multiple sites 07/24/2021   Lumbar facet syndrome (Bilateral) 06/07/2021   DDD (degenerative disc disease), 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   DDD (degenerative disc disease), lumbosacral 03/14/2021   Chronic lower extremity pain (2ry area of Pain) (Bilateral) (R>L) 03/14/2021   Chronic low back pain (1ry area of Pain) (Bilateral) (R>L) w/o sciatica 03/14/2021   Long term current use of non-steroidal anti-inflammatories (NSAID) 03/14/2021   Osteoarthritis of knee (Right) 03/14/2021   Osteoarthritis of patellofemoral joint (Right) 03/14/2021   Chronic pain syndrome 03/13/2021   Pharmacologic  therapy 03/13/2021   Disorder of skeletal system 03/13/2021   Problems influencing health status 03/13/2021   Chronic knee pain (3ry area of Pain) (Bilateral) (R>L) 03/13/2021   B12 deficiency 06/12/2017   Elevated PTHrP level 06/12/2017   Vitamin A deficiency 06/12/2017   Vitamin D deficiency 06/12/2017   Chronic fatigue 06/06/2017   Hypertension 06/06/2017   Morbid obesity with BMI of 50.0-59.9, adult ([place]) 06/06/2017   Obstructive sleep apnea 06/06/2017   S/P bariatric surgery 06/06/2017    Past Surgical History:  Procedure Laterality Date   CESAREAN SECTION     x 2   CYSTOSCOPY WITH STENT PLACEMENT Left 06/20/2016   Procedure: CYSTOSCOPY WITH STENT PLACEMENT;  Surgeon: Nickie Retort, MD;  Location: [facility];  Service: Urology;  Laterality: Left;   GASTRIC BYPASS     URETEROSCOPY WITH HOLMIUM LASER LITHOTRIPSY Left 06/20/2016   Procedure: URETEROSCOPY WITH HOLMIUM LASER LITHOTRIPSY;  Surgeon: Nickie Retort, MD;  Location: [facility];  Service: Urology;  Laterality: Left;    Family History  Problem Relation Age of Onset   Bladder Cancer Neg Hx    Prostate cancer Neg Hx    Kidney cancer Neg Hx     Social History   Socioeconomic History   Marital status: Single    Spouse name: Not on file   Number of children: Not on file   Years of education: Not on file   Highest education level: Not on file  Occupational History   Not on file  Tobacco Use  Smoking status: Never   Smokeless tobacco: Never  Vaping Use   Vaping Use: Never used  Substance and Sexual Activity   Alcohol use: Not Currently   Drug use: No   Sexual activity: Not Currently    Birth control/protection: Post-menopausal  Other Topics Concern   Not on file  Social History Narrative   Not on file   Social Determinants of Health   Financial Resource Strain: Not on file  Food Insecurity: Not on file  Transportation Needs: Not on file  Physical Activity: Not on file  Stress: Not on file   Social Connections: Not on file  Intimate Partner Violence: Not on file    Outpatient Medications Prior to Visit  Medication Sig Dispense Refill   etodolac (LODINE) 400 MG tablet Take 400 mg by mouth 2 (two) times daily.     furosemide (LASIX) 20 MG tablet 1 BY MOUTH EVERY MORNING FOR FLUID RETENTION AND HIGH BLOOD PRESSURE     lisinopril (ZESTRIL) 20 MG tablet Take by mouth.     cyclobenzaprine (FLEXERIL) 10 MG tablet Take 10 mg by mouth 2 (two) times daily as needed for muscle spasms.     No facility-administered medications prior to visit.      ROS:  Review of Systems  Constitutional:  Negative for fever.  Gastrointestinal:  Negative for blood in stool, constipation, diarrhea, nausea and vomiting.  Genitourinary:  Positive for vaginal bleeding. Negative for dyspareunia, dysuria, flank pain, frequency, hematuria, urgency, vaginal discharge and vaginal pain.  Musculoskeletal:  Negative for back pain.  Skin:  Negative for rash.   BREAST: No symptoms   OBJECTIVE:   Vitals:  Ht '5\' 7"'$  (1.702 m)   Wt (!) 347 lb (157.4 kg)   LMP  (LMP Unknown) Comment: neg preg test  BMI 54.35 kg/m   Physical Exam Vitals reviewed.  Constitutional:      Appearance: She is well-developed.  Pulmonary:     Effort: Pulmonary effort is normal.  Genitourinary:    General: Normal vulva.     Pubic Area: No rash.      Labia:        Right: No rash, tenderness or lesion.        Left: No rash, tenderness or lesion.      Vagina: Normal. No vaginal discharge, erythema or tenderness.     Cervix: Lesion present.     Uterus: Normal. Not enlarged and not tender.      Adnexa: Right adnexa normal and left adnexa normal.       Right: No mass or tenderness.         Left: No mass or tenderness.       Musculoskeletal:        General: Normal range of motion.     Cervical back: Normal range of motion.  Skin:    General: Skin is warm and dry.  Neurological:     General: No focal deficit present.      Mental Status: She is alert and oriented to person, place, and time.  Psychiatric:        Mood and Affect: Mood normal.        Behavior: Behavior normal.        Thought Content: Thought content normal.        Judgment: Judgment normal.     Assessment/Plan: PMB (postmenopausal bleeding) - Plan: US PELVIS TRANSVAGINAL NON-OB (TV ONLY), Cytology - PAP; sx for 1+ yrs, flow was heavier 9/23. Check pap, GYN u/s.  Will f/u with results and mgmt. Discussed EMB. Will refer to MD for mgmt prn results.   Cervical cancer screening - Plan: Cytology - PAP  Screening for HPV (human papillomavirus) - Plan: Cytology - PAP    Return in about 2 weeks (around 08/15/2022) for GYN u/s for PMB--[facility] to call with results. Elmo Putt B. Linh Hedberg, PA-C 08/01/2022 5:09 PM

## 2022-08-07 LAB — CYTOLOGY - PAP
Comment: NEGATIVE
Diagnosis: NEGATIVE
High risk HPV: NEGATIVE

## 2022-08-19 ENCOUNTER — Other Ambulatory Visit: Payer: BC Managed Care – PPO

## 2022-09-23 ENCOUNTER — Ambulatory Visit (INDEPENDENT_AMBULATORY_CARE_PROVIDER_SITE_OTHER): Payer: BC Managed Care – PPO

## 2022-09-23 DIAGNOSIS — N950 Postmenopausal bleeding: Secondary | ICD-10-CM

## 2022-09-24 ENCOUNTER — Telehealth: Payer: Self-pay | Admitting: Obstetrics and Gynecology

## 2022-09-24 NOTE — Telephone Encounter (Signed)
Pt aware of GYN results. EM=3.2 mm, but pt with PMB for a yr. Discussed with Dr. Marcelline Mates. Still recommend EMB given sx hx. Pt to RTO with her for EMB. Questions answered.

## 2022-09-25 ENCOUNTER — Encounter: Payer: BC Managed Care – PPO | Admitting: Obstetrics and Gynecology

## 2022-10-04 ENCOUNTER — Ambulatory Visit: Payer: BC Managed Care – PPO | Admitting: Obstetrics and Gynecology

## 2022-10-04 VITALS — BP 166/70 | HR 73 | Resp 16 | Ht 67.5 in | Wt 346.5 lb

## 2022-10-04 DIAGNOSIS — N950 Postmenopausal bleeding: Secondary | ICD-10-CM

## 2022-10-04 DIAGNOSIS — Z1211 Encounter for screening for malignant neoplasm of colon: Secondary | ICD-10-CM

## 2022-10-04 DIAGNOSIS — Z1231 Encounter for screening mammogram for malignant neoplasm of breast: Secondary | ICD-10-CM

## 2022-10-04 DIAGNOSIS — Z6841 Body Mass Index (BMI) 40.0 and over, adult: Secondary | ICD-10-CM

## 2022-10-04 MED ORDER — MEDROXYPROGESTERONE ACETATE 10 MG PO TABS: 10.0000 mg | ORAL_TABLET | Freq: Every day | ORAL | 1 refills | Status: AC

## 2022-10-05 ENCOUNTER — Encounter: Payer: Self-pay | Admitting: Obstetrics and Gynecology

## 2022-10-05 NOTE — Progress Notes (Signed)
GYNECOLOGY PROGRESS NOTE  Subjective:    Patient ID: Sharon Holden, female    DOB: 05/19/1961, 62 y.o.   MRN: [id number]  HPI  Patient is a 62 y.o. [id number] female who presents as a referral for endometrial biopsy due to recurrent episodes of PMB. Referred by [name], PA-C. Sharon Holden has been experiencing post-menopausal bleeding over the past year intermittently.  Also has a history of bleeding in 2016 with negative pathology after hysteroscopy D&C performed.  Notes that the bleeding occurs ~ every 3 months, mostly with wiping but is bright red, and lasts 1-2 days. Most recent episode was a little heavier which occurred ~ 4 months ago. Risk factors for PMB include obesity.  She has had recent ultrasound noting thin endometrial stripe, 3.2 mm, and not other structural pathology.  Most recent pap smear was negative, performed 08/01/2022.  The following portions of the patient's history were reviewed and updated as appropriate: allergies, current medications, past family history, past medical history, past social history, past surgical history, and problem list.  Review of Systems Pertinent items are noted in HPI.   Objective:   Blood pressure (!) 166/70, pulse 73, resp. rate 16, height 5' 7.5" (1.715 m), weight (!) 346 lb 8 oz (157.2 kg). Body mass index is 53.47 kg/m. General appearance: alert and no distress Abdomen: soft, non-tender; bowel sounds normal; no masses,  no organomegaly Pelvic: external genitalia normal, no lesions. Vagina without lesions or discharge, cervix unable to be visualized due to position and lengthy vaginal canal.  Cervix palpable, feels slightly flushed with vaginal canal.  Extremities: extremities normal, atraumatic, no cyanosis or edema Neurologic: Grossly normal    Imaging:  US PELVIS TRANSVAGINAL NON-OB (TV ONLY) Patient Name: [name] DOB: December 23, 1960 MRN: [id number] LMP: postmenopausal  ULTRASOUND REPORT  Location: [name] Date of Service: 09/23/2022   Indications: postmenopausal bleeding Findings:  The uterus is anteverted and measures 8.3 x 4.7 x 3.5 cm. Echo texture is homogenous without evidence of focal masses.  The Endometrium measures 3.2 mm.  Right Ovary is not visualized Left Ovary is not visualized. Survey of the adnexa demonstrates no adnexal masses. There is no free fluid in the cul de sac.  Impression: 1. Normal pelvic ultrasound  Recommendations: 1.Clinical correlation with the patient's History and Physical Exam.  [name], RDMS, RVT  The ultrasound images and findings were reviewed by me and I agree with  the above report.  [name], M.D. 10/03/2022 4:58 PM   Assessment:   1. PMB (postmenopausal bleeding)   2. Morbid obesity with BMI of 50.0-59.9, adult ([name])   3. Colon cancer screening      Plan:   1. PMB (postmenopausal bleeding) - Discussed purpose of endometrial biopsy in light of episodes of persistent bleeding. Advised that most likely cause was endometrial atrophy as endometrial stripe is normal. Unable to perform endometrial biopsy today.  Will hold for now.  Prescribed Provera for treatment of atrophy when next episode of bleeding occurs. Discussed that if bleeding persists beyond treatment, would recommend repeating Hysteroscopy D&C.   2. Morbid obesity with BMI of 50.0-59.9, adult ([name]) - Discussed risk of obesity and unopposed estrogen as risk factor for endometrial hyperplasia and malignancy.   3. Colon cancer screening - Patient reports she has never had colon screening.  Canceled her appointment last year as she notes that the particular clinic could only perform the procedure on Mondays, and this was not a good  day for her. Reviewed patient's family history, notes a brother with colon polyps. Strongly encouraged to reconsider screening, also added patient could schedule with a different clinic that may be able to offer different days.    4. Breast cancer screening by mammogram - Patient has not had a mammogram in the past 3 years, last mammogram was abnormal, followed by diagnostic mammogram and ultrasound noting left breast cyst. Encouraged patient to follow up with scheduling. - MM DIGITAL SCREENING BILATERAL; Future   Return if symptoms worsen or fail to improve.    [name], MD [facility] at [facility]

## 2022-10-08 ENCOUNTER — Encounter: Payer: Self-pay | Admitting: Obstetrics and Gynecology

## 2023-01-02 ENCOUNTER — Other Ambulatory Visit: Payer: Self-pay | Admitting: Family Medicine

## 2023-01-02 DIAGNOSIS — Z1231 Encounter for screening mammogram for malignant neoplasm of breast: Secondary | ICD-10-CM

## 2023-02-05 ENCOUNTER — Other Ambulatory Visit: Payer: Self-pay | Admitting: *Deleted

## 2023-02-05 ENCOUNTER — Inpatient Hospital Stay
Admission: RE | Admit: 2023-02-05 | Discharge: 2023-02-05 | Disposition: A | Discharge status: Home / Self Care | Payer: Self-pay | Source: Ambulatory Visit | Attending: Family Medicine | Admitting: Family Medicine

## 2023-02-05 DIAGNOSIS — Z1231 Encounter for screening mammogram for malignant neoplasm of breast: Secondary | ICD-10-CM

## 2023-02-11 LAB — COLOGUARD: COLOGUARD: NEGATIVE

## 2023-03-04 ENCOUNTER — Ambulatory Visit
Admission: RE | Admit: 2023-03-04 | Discharge: 2023-03-04 | Disposition: A | Discharge status: Home / Self Care | Payer: BC Managed Care – PPO | Source: Ambulatory Visit | Attending: Family Medicine | Admitting: Family Medicine

## 2023-03-04 DIAGNOSIS — Z1231 Encounter for screening mammogram for malignant neoplasm of breast: Secondary | ICD-10-CM | POA: Diagnosis present

## 2024-06-06 ENCOUNTER — Encounter: Payer: Self-pay | Admitting: Emergency Medicine

## 2024-06-06 ENCOUNTER — Other Ambulatory Visit: Payer: Self-pay

## 2024-06-06 ENCOUNTER — Emergency Department

## 2024-06-06 ENCOUNTER — Emergency Department
Admission: EM | Admit: 2024-06-06 | Discharge: 2024-06-06 | Disposition: A | Discharge status: Home / Self Care | Attending: Emergency Medicine | Admitting: Emergency Medicine

## 2024-06-06 DIAGNOSIS — Z87442 Personal history of urinary calculi: Secondary | ICD-10-CM | POA: Diagnosis not present

## 2024-06-06 DIAGNOSIS — R109 Unspecified abdominal pain: Secondary | ICD-10-CM | POA: Diagnosis not present

## 2024-06-06 DIAGNOSIS — M545 Low back pain, unspecified: Secondary | ICD-10-CM | POA: Diagnosis present

## 2024-06-06 LAB — CBC WITH DIFFERENTIAL/PLATELET
Abs Immature Granulocytes: 0.02 K/uL (ref 0.00–0.07)
Basophils Absolute: 0 K/uL (ref 0.0–0.1)
Basophils Relative: 0 %
Eosinophils Absolute: 0.1 K/uL (ref 0.0–0.5)
Eosinophils Relative: 2 %
HCT: 42.1 % (ref 36.0–46.0)
Hemoglobin: 14.1 g/dL (ref 12.0–15.0)
Immature Granulocytes: 0 %
Lymphocytes Relative: 42 %
Lymphs Abs: 2 K/uL (ref 0.7–4.0)
MCH: 31.2 pg (ref 26.0–34.0)
MCHC: 33.5 g/dL (ref 30.0–36.0)
MCV: 93.1 fL (ref 80.0–100.0)
Monocytes Absolute: 0.4 K/uL (ref 0.1–1.0)
Monocytes Relative: 7 %
Neutro Abs: 2.4 K/uL (ref 1.7–7.7)
Neutrophils Relative %: 49 %
Platelets: 191 K/uL (ref 150–400)
RBC: 4.52 MIL/uL (ref 3.87–5.11)
RDW: 15.2 % (ref 11.5–15.5)
WBC: 4.9 K/uL (ref 4.0–10.5)
nRBC: 0 % (ref 0.0–0.2)

## 2024-06-06 LAB — URINALYSIS, ROUTINE W REFLEX MICROSCOPIC
Glucose, UA: NEGATIVE mg/dL
Hgb urine dipstick: NEGATIVE
Ketones, ur: NEGATIVE mg/dL
Leukocytes,Ua: NEGATIVE
Nitrite: NEGATIVE
Protein, ur: 100 mg/dL — AB
Specific Gravity, Urine: 1.03 (ref 1.005–1.030)
pH: 5 (ref 5.0–8.0)

## 2024-06-06 LAB — COMPREHENSIVE METABOLIC PANEL WITH GFR
ALT: 12 U/L (ref 0–44)
AST: 15 U/L (ref 15–41)
Albumin: 3.7 g/dL (ref 3.5–5.0)
Alkaline Phosphatase: 71 U/L (ref 38–126)
Anion gap: 11 (ref 5–15)
BUN: 11 mg/dL (ref 8–23)
CO2: 24 mmol/L (ref 22–32)
Calcium: 9 mg/dL (ref 8.9–10.3)
Chloride: 104 mmol/L (ref 98–111)
Creatinine, Ser: 0.96 mg/dL (ref 0.44–1.00)
GFR, Estimated: >60 mL/min (ref >60)
Glucose, Bld: 112 mg/dL — ABNORMAL HIGH (ref 70–99)
Potassium: 3.2 mmol/L — ABNORMAL LOW (ref 3.5–5.1)
Sodium: 139 mmol/L (ref 135–145)
Total Bilirubin: 2.5 mg/dL — ABNORMAL HIGH (ref 0.0–1.2)
Total Protein: 6.7 g/dL (ref 6.5–8.1)

## 2024-06-06 MED ORDER — METHOCARBAMOL 500 MG PO TABS: 500.0000 mg | ORAL_TABLET | Freq: Three times a day (TID) | ORAL | 1 refills | Status: AC | PRN

## 2024-06-06 MED ORDER — SODIUM CHLORIDE 0.9 % IV SOLN: Freq: Once | INTRAVENOUS | Status: AC

## 2024-06-06 MED ORDER — ONDANSETRON HCL 4 MG/2ML IJ SOLN
4.0000 mg | Freq: Once | INTRAMUSCULAR | Status: AC
  Administered 2024-06-06: 4 mg via INTRAVENOUS
  Filled 2024-06-06: qty 2

## 2024-06-06 MED ORDER — KETOROLAC TROMETHAMINE 30 MG/ML IJ SOLN
30.0000 mg | Freq: Once | INTRAMUSCULAR | Status: AC
  Administered 2024-06-06: 30 mg via INTRAVENOUS
  Filled 2024-06-06: qty 1

## 2024-06-06 MED ORDER — NAPROXEN 500 MG PO TABS: 500.0000 mg | ORAL_TABLET | Freq: Two times a day (BID) | ORAL | 2 refills | Status: AC

## 2024-06-06 NOTE — ED Triage Notes (Signed)
 Pt c/o L flank pain since Thursday but worsened overnight. She woke up this morning and vomited. Pt has hx of renal stones. Pt denies blood in urine.

## 2024-06-06 NOTE — ED Notes (Signed)
 See triage note  Presents with left flank pain  States pain started on Thursday  Became worse this am  Pos n/v

## 2024-06-06 NOTE — ED Provider Notes (Signed)
 [facility] Provider Note    Event Date/Time   First MD Initiated Contact with Patient 06/06/24 1157     (approximate)   History   Flank Pain   HPI  Sharon GATHERS is a 63 y.o. female with a history of kidney stones who presents with complaints of left back/flank pain which has been ongoing for about 3 days, seem to worsen last night.  Denies fevers or chills, no dysuria reported.  No hematuria noticed.  No fevers.,  No reported history of diverticulitis     Physical Exam   Triage Vital Signs: ED Triage Vitals  Encounter Vitals Group     BP 06/06/24 1128 (!) 154/99     Girls Systolic BP Percentile --      Girls Diastolic BP Percentile --      Boys Systolic BP Percentile --      Boys Diastolic BP Percentile --      Pulse Rate 06/06/24 1128 89     Resp 06/06/24 1128 17     Temp 06/06/24 1127 98.8 F (37.1 C)     Temp Source 06/06/24 1127 Oral     SpO2 06/06/24 1128 98 %     Weight 06/06/24 1129 136.1 kg (300 lb)     Height 06/06/24 1129 1.727 m (5' 8)     Head Circumference --      Peak Flow --      Pain Score 06/06/24 1128 8     Pain Loc --      Pain Education --      Exclude from Growth Chart --     Most recent vital signs: Vitals:   06/06/24 1127 06/06/24 1128  BP:  (!) 154/99  Pulse:  89  Resp:  17  Temp: 98.8 F (37.1 C)   SpO2:  98%     General: Awake, no distress.  CV:  Good peripheral perfusion.  Resp:  Normal effort.  Abd:  No distention.  Other:  No vertebral tenderness palpation, mild left lumbar paraspinal tenderness palpation suspicious for muscle spasm, no CVA tenderness   ED Results / Procedures / Treatments   Labs (all labs ordered are listed, but only abnormal results are displayed) Labs Reviewed  COMPREHENSIVE METABOLIC PANEL WITH GFR - Abnormal; Notable for the following components:      Result Value   Potassium 3.2 (*)    Glucose, Bld 112 (*)    Total Bilirubin 2.5 (*)    All other components  within normal limits  URINALYSIS, ROUTINE W REFLEX MICROSCOPIC - Abnormal; Notable for the following components:   Color, Urine AMBER (*)    APPearance HAZY (*)    Bilirubin Urine SMALL (*)    Protein, ur 100 (*)    Bacteria, UA RARE (*)    All other components within normal limits  CBC WITH DIFFERENTIAL/PLATELET     EKG     RADIOLOGY CT without evidence of ureterolithiasis, patient has no right upper quadrant    PROCEDURES:  Critical Care performed:   Procedures   MEDICATIONS ORDERED IN ED: Medications  ketorolac  (TORADOL ) 30 MG/ML injection 30 mg (30 mg Intravenous Given 06/06/24 1214)  ondansetron  (ZOFRAN ) injection 4 mg (4 mg Intravenous Given 06/06/24 1214)  0.9 %  sodium chloride  infusion ( Intravenous New Bag/Given 06/06/24 1214)     IMPRESSION / MDM / ASSESSMENT AND PLAN / ED COURSE  I reviewed the triage vital signs and the nursing notes. Patient's presentation is  most consistent with acute presentation with potential threat to life or bodily function.  Patient presents with left flank/back pain as detailed above, differential includes ureterolithiasis, pyelonephritis, musculoskeletal pain  Will treat with IV Toradol , IV Zofran , IV fluids obtain CT, labs urinalysis and reevaluate  CT scan is negative for ureterolithiasis, radiologist notes gallbladder sludge but patient has no right upper quadrant abdominal pain.  Pending urinalysis, lab work otherwise unremarkable  Patient feeling better after treatment with Toradol , urinalysis without signs of infection.  Suspect musculoskeletal pain, will Rx analgesics, muscle relaxant, outpatient follow-up with PCP recommended, return precautions discussed, she agrees to this plan      FINAL CLINICAL IMPRESSION(S) / ED DIAGNOSES   Final diagnoses:  Acute left-sided low back pain without sciatica     Rx / DC Orders   ED Discharge Orders          Ordered    naproxen (NAPROSYN) 500 MG tablet  2 times daily with  meals        06/06/24 1328    methocarbamol (ROBAXIN) 500 MG tablet  Every 8 hours PRN        06/06/24 1328             Note:  This document was prepared using Dragon voice recognition software and may include unintentional dictation errors.   Arlander Charleston, MD 06/06/24 [phone]

## 2024-07-28 ENCOUNTER — Other Ambulatory Visit: Payer: Self-pay | Admitting: Family Medicine

## 2024-07-28 DIAGNOSIS — Z1231 Encounter for screening mammogram for malignant neoplasm of breast: Secondary | ICD-10-CM

## 2024-09-13 ENCOUNTER — Encounter

## 2024-10-06 ENCOUNTER — Ambulatory Visit: Admission: RE | Admit: 2024-10-06 | Source: Ambulatory Visit
